# Patient Record
Sex: Female | Born: 1995 | Race: White | Hispanic: No | Marital: Married | Smoking: Current every day smoker
Health system: Southern US, Community
[De-identification: ages and names within clinical notes are randomized; demographics above are authoritative.]

## PROBLEM LIST (undated history)

## (undated) DIAGNOSIS — O24419 Gestational diabetes mellitus in pregnancy, unspecified control: Secondary | ICD-10-CM

## (undated) DIAGNOSIS — F32A Depression, unspecified: Secondary | ICD-10-CM

## (undated) DIAGNOSIS — G43909 Migraine, unspecified, not intractable, without status migrainosus: Secondary | ICD-10-CM

## (undated) DIAGNOSIS — O039 Complete or unspecified spontaneous abortion without complication: Secondary | ICD-10-CM

## (undated) DIAGNOSIS — F909 Attention-deficit hyperactivity disorder, unspecified type: Secondary | ICD-10-CM

## (undated) DIAGNOSIS — I10 Essential (primary) hypertension: Secondary | ICD-10-CM

## (undated) DIAGNOSIS — F419 Anxiety disorder, unspecified: Secondary | ICD-10-CM

## (undated) DIAGNOSIS — F3181 Bipolar II disorder: Secondary | ICD-10-CM

## (undated) DIAGNOSIS — F329 Major depressive disorder, single episode, unspecified: Secondary | ICD-10-CM

## (undated) HISTORY — DX: Complete or unspecified spontaneous abortion without complication: O03.9

## (undated) HISTORY — DX: Attention-deficit hyperactivity disorder, unspecified type: F90.9

## (undated) HISTORY — PX: TONSILLECTOMY AND ADENOIDECTOMY: SHX28

## (undated) HISTORY — DX: Essential (primary) hypertension: I10

## (undated) HISTORY — DX: Bipolar II disorder: F31.81

## (undated) HISTORY — DX: Depression, unspecified: F32.A

## (undated) HISTORY — DX: Gestational diabetes mellitus in pregnancy, unspecified control: O24.419

## (undated) HISTORY — DX: Anxiety disorder, unspecified: F41.9

---
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
## 2013-04-23 ENCOUNTER — Emergency Department: Payer: Self-pay | Admitting: Internal Medicine

## 2013-09-16 ENCOUNTER — Emergency Department: Payer: Self-pay | Admitting: Emergency Medicine

## 2013-09-16 LAB — CBC
HCT: 36.6 % (ref 35.0–47.0)
MCV: 84 fL (ref 80–100)
RDW: 13.5 % (ref 11.5–14.5)

## 2013-09-17 LAB — URINALYSIS, COMPLETE
Bacteria: NONE SEEN
Bilirubin,UR: NEGATIVE
Ketone: NEGATIVE
Leukocyte Esterase: NEGATIVE
Nitrite: NEGATIVE
Ph: 8 (ref 4.5–8.0)
Protein: NEGATIVE
Specific Gravity: 1.01 (ref 1.003–1.030)
Squamous Epithelial: <1
WBC UR: 1 /HPF (ref 0–5)

## 2013-09-17 LAB — BASIC METABOLIC PANEL
Anion Gap: 7 (ref 7–16)
Calcium, Total: 8.8 mg/dL — ABNORMAL LOW (ref 9.0–10.7)
Chloride: 109 mmol/L — ABNORMAL HIGH (ref 98–107)
Co2: 22 mmol/L (ref 21–32)
Creatinine: 0.66 mg/dL (ref 0.60–1.30)
EGFR (African American): >60
Osmolality: 277 (ref 275–301)
Potassium: 3.2 mmol/L — ABNORMAL LOW (ref 3.5–5.1)
Sodium: 138 mmol/L (ref 136–145)

## 2013-09-17 LAB — TROPONIN I: Troponin-I: <0.02 ng/mL

## 2013-09-20 ENCOUNTER — Ambulatory Visit: Payer: Self-pay | Admitting: Pediatrics

## 2015-05-21 ENCOUNTER — Encounter: Payer: Self-pay | Admitting: Emergency Medicine

## 2015-05-21 ENCOUNTER — Emergency Department
Admission: EM | Admit: 2015-05-21 | Discharge: 2015-05-21 | Disposition: A | Discharge status: Home / Self Care | Payer: 59 | Attending: Student | Admitting: Student

## 2015-05-21 DIAGNOSIS — G43901 Migraine, unspecified, not intractable, with status migrainosus: Secondary | ICD-10-CM | POA: Diagnosis not present

## 2015-05-21 DIAGNOSIS — G43001 Migraine without aura, not intractable, with status migrainosus: Secondary | ICD-10-CM

## 2015-05-21 DIAGNOSIS — Z87891 Personal history of nicotine dependence: Secondary | ICD-10-CM | POA: Insufficient documentation

## 2015-05-21 DIAGNOSIS — Z79899 Other long term (current) drug therapy: Secondary | ICD-10-CM | POA: Insufficient documentation

## 2015-05-21 DIAGNOSIS — G43909 Migraine, unspecified, not intractable, without status migrainosus: Secondary | ICD-10-CM | POA: Diagnosis present

## 2015-05-21 HISTORY — DX: Migraine, unspecified, not intractable, without status migrainosus: G43.909

## 2015-05-21 MED ORDER — PROMETHAZINE HCL 25 MG PO TABS
ORAL_TABLET | ORAL | Status: AC
  Administered 2015-05-21: 25 mg via ORAL
  Filled 2015-05-21: qty 1

## 2015-05-21 MED ORDER — BUTALBITAL-APAP-CAFFEINE 50-325-40 MG PO TABS: 1.0000 | ORAL_TABLET | Freq: Four times a day (QID) | ORAL | Status: AC | PRN

## 2015-05-21 MED ORDER — PROMETHAZINE HCL 25 MG PO TABS
25.0000 mg | ORAL_TABLET | Freq: Once | ORAL | Status: AC
  Administered 2015-05-21: 25 mg via ORAL

## 2015-05-21 MED ORDER — KETOROLAC TROMETHAMINE 60 MG/2ML IM SOLN
INTRAMUSCULAR | Status: AC
  Administered 2015-05-21: 60 mg via INTRAMUSCULAR
  Filled 2015-05-21: qty 2

## 2015-05-21 MED ORDER — KETOROLAC TROMETHAMINE 60 MG/2ML IM SOLN
60.0000 mg | Freq: Once | INTRAMUSCULAR | Status: AC
  Administered 2015-05-21: 60 mg via INTRAMUSCULAR

## 2015-05-21 NOTE — ED Provider Notes (Signed)
[facility] Emergency Department Provider Note  ____________________________________________  Time seen: Approximately 3:07 PM  I have reviewed the triage vital signs and the nursing notes.   HISTORY  Chief Complaint Migraine    HPI Frances Lamb is a 19 y.o. female presents to the emergency room with complaints of a migraine headache. Patient has a history of migraines in the past but has never seen a neurologist for the same. This is not the worst headache of her life. She needs a note for work per her request. Positive nausea, but negative vomiting. Positive photophobia and audiophobia.   Past Medical History  Diagnosis Date  . Migraine     There are no active problems to display for this patient.   No past surgical history on file.  Current Outpatient Rx  Name  Route  Sig  Dispense  Refill  . amphetamine-dextroamphetamine (ADDERALL) 10 MG tablet   Oral   Take 10 mg by mouth daily with breakfast.         . DULoxetine (CYMBALTA) 60 MG capsule   Oral   Take 60 mg by mouth daily.         . norethindrone-ethinyl estradiol 1/35 (ORTHO-NOVUM, NORTREL,CYCLAFEM) tablet   Oral   Take 1 tablet by mouth daily.         . QUEtiapine (SEROQUEL) 100 MG tablet   Oral   Take 100 mg by mouth at bedtime.         . butalbital-acetaminophen-caffeine (FIORICET) 50-325-40 MG per tablet   Oral   Take 1-2 tablets by mouth every 6 (six) hours as needed for headache.   20 tablet   0     Allergies Review of patient's allergies indicates no known allergies.  No family history on file.  Social History History  Substance Use Topics  . Smoking status: Former Games developer  . Smokeless tobacco: Current User  . Alcohol Use: Yes    Review of Systems Constitutional: No fever/chills Eyes: No visual changes. ENT: No sore throat. Positive headache Cardiovascular: Denies chest pain. Respiratory: Denies shortness of breath. Gastrointestinal: No abdominal  pain.  No nausea, no vomiting.  No diarrhea.  No constipation. Genitourinary: Negative for dysuria. Musculoskeletal: Negative for back pain. Skin: Negative for rash. Neurological: Positive for headaches, negative for focal weakness or numbness.  10-point ROS otherwise negative.  ____________________________________________   PHYSICAL EXAM:  VITAL SIGNS: ED Triage Vitals  Enc Vitals Group     BP 05/21/15 1251 133/82 mmHg     Pulse Rate 05/21/15 1251 96     Resp 05/21/15 1251 14     Temp 05/21/15 1250 98.6 F (37 C)     Temp Source 05/21/15 1250 Oral     SpO2 05/21/15 1251 99 %     Weight 05/21/15 1250 210 lb (95.255 kg)     Height 05/21/15 1250 5\' 2"  (1.575 m)     Head Cir --      Peak Flow --      Pain Score 05/21/15 1252 7     Pain Loc --      Pain Edu? --      Excl. in GC? --     Constitutional: Alert and oriented. Well appearing and in no acute distress. Eyes: Conjunctivae are normal. PERRL. EOMI. Head: Atraumatic. Nose: No congestion/rhinnorhea. Mouth/Throat: Mucous membranes are moist.  Oropharynx non-erythematous. Neck: No stridor.   Cardiovascular: Normal rate, regular rhythm. Grossly normal heart sounds.  Good peripheral circulation. Respiratory: Normal respiratory  effort.  No retractions. Lungs CTAB. Gastrointestinal: Soft and nontender. No distention. No abdominal bruits. No CVA tenderness. Musculoskeletal: No lower extremity tenderness nor edema.  No joint effusions. Neurologic:  Normal speech and language. No gross focal neurologic deficits are appreciated. Speech is normal. No gait instability. Skin:  Skin is warm, dry and intact. No rash noted. Psychiatric: Mood and affect are normal. Speech and behavior are normal.  ____________________________________________   LABS (all labs ordered are listed, but only abnormal results are displayed)  Labs Reviewed - No data to  display ____________________________________________  EKG  Deferred ____________________________________________  RADIOLOGY  Deferred ____________________________________________   PROCEDURES  Procedure(s) performed: None  Critical Care performed: No  ____________________________________________   INITIAL IMPRESSION / ASSESSMENT AND PLAN / ED COURSE  Pertinent labs & imaging results that were available during my care of the patient were reviewed by me and considered in my medical decision making (see chart for details).  Migraine headache. These migraines are chronic in nature and actually need to be followed up with neurology. Toradol 60 mg IM and Phenergan 25 mg by mouth given while in the ED. Patient states improvement after about 20 minutes.  Referral given to Dr. Katrinka Blazing, neurology. Patient to follow up with PCP as needed. Rx given for Fioricet as needed for headaches. Note given for work.  Patient denies any other emergency medical complaints at this time. Will return to the ER for worsening symptomology. ____________________________________________   FINAL CLINICAL IMPRESSION(S) / ED DIAGNOSES  Final diagnoses:  Migraine without aura and with status migrainosus, not intractable      Evangeline Dakin, PA-C 05/21/15 1634  Gayla Doss, MD 05/22/15 [id number]

## 2015-05-21 NOTE — Discharge Instructions (Signed)
Recurrent Migraine Headache A migraine headache is an intense, throbbing pain on one or both sides of your head. Recurrent migraines keep coming back. A migraine can last for 30 minutes to several hours. CAUSES  The exact cause of a migraine headache is not always known. However, a migraine may be caused when nerves in the brain become irritated and release chemicals that cause inflammation. This causes pain. Certain things may also trigger migraines, such as:   Alcohol.  Smoking.  Stress.  Menstruation.  Aged cheeses.  Foods or drinks that contain nitrates, glutamate, aspartame, or tyramine.  Lack of sleep.  Chocolate.  Caffeine.  Hunger.  Physical exertion.  Fatigue.  Medicines used to treat chest pain (nitroglycerine), birth control pills, estrogen, and some blood pressure medicines. SYMPTOMS   Pain on one or both sides of your head.  Pulsating or throbbing pain.  Severe pain that prevents daily activities.  Pain that is aggravated by any physical activity.  Nausea, vomiting, or both.  Dizziness.  Pain with exposure to bright lights, loud noises, or activity.  General sensitivity to bright lights, loud noises, or smells. Before you get a migraine, you may get warning signs that a migraine is coming (aura). An aura may include:  Seeing flashing lights.  Seeing bright spots, halos, or zigzag lines.  Having tunnel vision or blurred vision.  Having feelings of numbness or tingling.  Having trouble talking.  Having muscle weakness. DIAGNOSIS  A recurrent migraine headache is often diagnosed based on:  Symptoms.  Physical examination.  A CT scan or MRI of your head. These imaging tests cannot diagnose migraines but can help rule out other causes of headaches.  TREATMENT  Medicines may be given for pain and nausea. Medicines can also be given to help prevent recurrent migraines. HOME CARE INSTRUCTIONS  Only take over-the-counter or prescription  medicines for pain or discomfort as directed by your health care provider. The use of long-term narcotics is not recommended.  Lie down in a dark, quiet room when you have a migraine.  Keep a journal to find out what may trigger your migraine headaches. For example, write down:  What you eat and drink.  How much sleep you get.  Any change to your diet or medicines.  Limit alcohol consumption.  Quit smoking if you smoke.  Get 7-9 hours of sleep, or as recommended by your health care provider.  Limit stress.  Keep lights dim if bright lights bother you and make your migraines worse. SEEK MEDICAL CARE IF:   You do not get relief from the medicines given to you.  You have a recurrence of pain.  You have a fever. SEEK IMMEDIATE MEDICAL CARE IF:  Your migraine becomes severe.  You have a stiff neck.  You have loss of vision.  You have muscular weakness or loss of muscle control.  You start losing your balance or have trouble walking.  You feel faint or pass out.  You have severe symptoms that are different from your first symptoms. MAKE SURE YOU:   Understand these instructions.  Will watch your condition.  Will get help right away if you are not doing well or get worse. Document Released: 08/10/2001 Document Revised: 04/01/2014 Document Reviewed: 07/23/2013 Exit[employer] Patient Information 2015 [employer]. This information is not intended to replace advice given to you by your health care provider. Make sure you discuss any questions you have with your health care provider.  

## 2015-05-21 NOTE — ED Notes (Signed)
Migraine.  History of same, but now they are almost daily.

## 2016-12-23 ENCOUNTER — Encounter: Payer: Self-pay | Admitting: Emergency Medicine

## 2016-12-23 ENCOUNTER — Emergency Department
Admission: EM | Admit: 2016-12-23 | Discharge: 2016-12-23 | Disposition: A | Discharge status: Home / Self Care | Payer: BC Managed Care – PPO | Attending: Emergency Medicine | Admitting: Emergency Medicine

## 2016-12-23 DIAGNOSIS — K602 Anal fissure, unspecified: Secondary | ICD-10-CM | POA: Diagnosis not present

## 2016-12-23 DIAGNOSIS — F17200 Nicotine dependence, unspecified, uncomplicated: Secondary | ICD-10-CM | POA: Diagnosis not present

## 2016-12-23 DIAGNOSIS — K625 Hemorrhage of anus and rectum: Secondary | ICD-10-CM

## 2016-12-23 LAB — BASIC METABOLIC PANEL
Anion gap: 6 (ref 5–15)
BUN: 10 mg/dL (ref 6–20)
CHLORIDE: 105 mmol/L (ref 101–111)
CO2: 27 mmol/L (ref 22–32)
Calcium: 8.8 mg/dL — ABNORMAL LOW (ref 8.9–10.3)
Creatinine, Ser: 0.82 mg/dL (ref 0.44–1.00)
GFR calc non Af Amer: >60 mL/min (ref >60)
Glucose, Bld: 94 mg/dL (ref 65–99)
POTASSIUM: 4.3 mmol/L (ref 3.5–5.1)
Sodium: 138 mmol/L (ref 135–145)

## 2016-12-23 LAB — CBC
HCT: 38.8 % (ref 35.0–47.0)
HEMOGLOBIN: 13.6 g/dL (ref 12.0–16.0)
MCH: 30.5 pg (ref 26.0–34.0)
MCHC: 35.1 g/dL (ref 32.0–36.0)
MCV: 86.9 fL (ref 80.0–100.0)
Platelets: 286 10*3/uL (ref 150–440)
RBC: 4.46 MIL/uL (ref 3.80–5.20)
RDW: 13.4 % (ref 11.5–14.5)
WBC: 9.2 10*3/uL (ref 3.6–11.0)

## 2016-12-23 LAB — POCT PREGNANCY, URINE: PREG TEST UR: NEGATIVE

## 2016-12-23 MED ORDER — DOCUSATE SODIUM 100 MG PO CAPS: 100.0000 mg | ORAL_CAPSULE | Freq: Two times a day (BID) | ORAL | 0 refills | Status: AC

## 2016-12-23 MED ORDER — KETOROLAC TROMETHAMINE 30 MG/ML IJ SOLN
30.0000 mg | Freq: Once | INTRAMUSCULAR | Status: AC
Start: 2016-12-23 — End: 2016-12-23
  Administered 2016-12-23: 30 mg via INTRAVENOUS
  Filled 2016-12-23: qty 1

## 2016-12-23 NOTE — ED Triage Notes (Signed)
Patient ambulatory to triage with steady gait, without difficulty or distress noted; pt reports since last night having rectal pain with bleeding; st hx of "stomach problems with frequent constipation and diarrhea"; also reports left lower and mid lower abd pain with nausea

## 2016-12-23 NOTE — ED Provider Notes (Signed)
[facility] Emergency Department Provider Note  ____________________________________________  Time seen: Approximately 8:25 AM  I have reviewed the triage vital signs and the nursing notes.   HISTORY  Chief Complaint No chief complaint on file.   HPI Frances Lamb is a 21 y.o. female no significant past medical history who presents for evaluation of rectal bleeding. Patient reports since 6 PM yesterday she has had 6 bowel movements with bright red blood in the toilet. She is unable to quantify but tells me was a large amount. She also has had rectal pain. The first episode of bleeding happen while she was having a bowel movement. Patient endorses for the last many years she has alternating constipation and diarrhea. She is adopted and does not know her family history. She has never seen a GI specialist. She has had prior episodes of small amount of rectal bleeding but nothing like this one. She also endorses diffuse cramping abdominal pain and has been passing mucus from her rectum during her last few bowel movements. No vomiting, no NSAID use, she drinks alcohol occasionally, no peptic ulcer disease history. She denies fever or chills, she sure the bleeding is not coming from her vagina. She is on OCPs and her period is not due for another 2 weeks.  Past Medical History:  Diagnosis Date  . Migraine     There are no active problems to display for this patient.   History reviewed. No pertinent surgical history.  Prior to Admission medications   Medication Sig Start Date End Date Taking? Authorizing Provider  amphetamine-dextroamphetamine (ADDERALL) 30 MG tablet Take 30 mg by mouth daily with breakfast.    Yes Historical Provider, MD  DULoxetine (CYMBALTA) 60 MG capsule Take 60 mg by mouth daily.   Yes Historical Provider, MD  lamoTRIgine (LAMICTAL) 200 MG tablet Take 200 mg by mouth daily. 08/07/15  Yes Historical Provider, MD  norethindrone-ethinyl estradiol  1/35 (ORTHO-NOVUM, NORTREL,CYCLAFEM) tablet Take 1 tablet by mouth daily.   Yes Historical Provider, MD  Prenatal Vit-Fe Fumarate-FA (PRENATAL COMPLETE PO) Take 1 tablet by mouth daily.   Yes Historical Provider, MD  traZODone (DESYREL) 100 MG tablet Take 200 mg by mouth daily.   Yes Historical Provider, MD  vitamin B-12 (CYANOCOBALAMIN) 1000 MCG tablet Take 1,000 mcg by mouth daily.   Yes Historical Provider, MD  docusate sodium (COLACE) 100 MG capsule Take 1 capsule (100 mg total) by mouth 2 (two) times daily. 12/23/16 12/23/17  Nita Sickle, MD  QUEtiapine (SEROQUEL) 100 MG tablet Take 100 mg by mouth at bedtime.    Historical Provider, MD    Allergies Patient has no known allergies.  No family history on file.  Social History Social History  Substance Use Topics  . Smoking status: Former Games developer  . Smokeless tobacco: Current User  . Alcohol use Yes    Review of Systems  Constitutional: Negative for fever. Eyes: Negative for visual changes. ENT: Negative for sore throat. Neck: No neck pain  Cardiovascular: Negative for chest pain. Respiratory: Negative for shortness of breath. Gastrointestinal: Negative for abdominal pain, vomiting or diarrhea. Genitourinary: Negative for dysuria. + rectal bleeding Musculoskeletal: Negative for back pain. Skin: Negative for rash. Neurological: Negative for headaches, weakness or numbness. Psych: No SI or HI  ____________________________________________   PHYSICAL EXAM:  VITAL SIGNS: ED Triage Vitals [12/23/16 0646]  Enc Vitals Group     BP 128/62     Pulse Rate 91     Resp 18  Temp 98.1 F (36.7 C)     Temp Source Oral     SpO2 97 %     Weight 230 lb (104.3 kg)     Height 5\' 2"  (1.575 m)     Head Circumference      Peak Flow      Pain Score 7     Pain Loc      Pain Edu?      Excl. in GC?     Constitutional: Alert and oriented. Well appearing and in no apparent distress. HEENT:      Head: Normocephalic and  atraumatic.         Eyes: Conjunctivae are normal. Sclera is non-icteric. EOMI. PERRL      Mouth/Throat: Mucous membranes are moist.       Neck: Supple with no signs of meningismus. Cardiovascular: Regular rate and rhythm. No murmurs, gallops, or rubs. 2+ symmetrical distal pulses are present in all extremities. No JVD. Respiratory: Normal respiratory effort. Lungs are clear to auscultation bilaterally. No wheezes, crackles, or rhonchi.  Gastrointestinal: Soft, mildly tender to palpation diffusely, and non distended with positive bowel sounds. No rebound or guarding. Genitourinary: No CVA tenderness. Rectal exam showing a posterior anal fissure with no active bleeding, rectal exam with no evidence of internal hemorrhoids, no stool in the rectal vault, there is old blood perianally.  Musculoskeletal: Nontender with normal range of motion in all extremities. No edema, cyanosis, or erythema of extremities. Neurologic: Normal speech and language. Face is symmetric. Moving all extremities. No gross focal neurologic deficits are appreciated. Skin: Skin is warm, dry and intact. No rash noted. Psychiatric: Mood and affect are normal. Speech and behavior are normal.  ____________________________________________   LABS (all labs ordered are listed, but only abnormal results are displayed)  Labs Reviewed  BASIC METABOLIC PANEL - Abnormal; Notable for the following:       Result Value   Calcium 8.8 (*)    All other components within normal limits  CBC  POCT PREGNANCY, URINE   ____________________________________________  EKG  none ____________________________________________  RADIOLOGY  none  ____________________________________________   PROCEDURES  Procedure(s) performed: None Procedures Critical Care performed:  None ____________________________________________   INITIAL IMPRESSION / ASSESSMENT AND PLAN / ED COURSE  21 y.o. female no significant past medical history who  presents for evaluation of rectal bleeding x 6 since last night. Patient found to have an anal fissure which fits her presentation of bleeding and rectal pain. We'll check CBC since patient has had 6 episodes although she is hemodynamically stable and I have low suspicion she'll have an acute blood loss anemia. Patient has had many years of alternating constipation and diarrhea concerning for IBS or IBD. She has unknown family history. We'll refer her to GI for further evaluation. At this time I have low suspicion for other pathologies such as upper GI bleed from peptic ulcer disease or gastritis with no coffee-ground emesis, no NSAID use. Also low suspicion for diverticular bleed since patient is very young.   Clinical Course as of Dec 23 920  Thu Dec 23, 2016  0920 Hemoglobin is normal at 13.6. Patient will be discharged home with referral to see GI and close follow-up with Dr. Thedore MinsSingh. who is her primary care doctor.  [CV]    Clinical Course User Index [CV] Nita Sicklearolina Cailen Mihalik, MD    Pertinent labs & imaging results that were available during my care of the patient were reviewed by me and considered in my medical  decision making (see chart for details).    ____________________________________________   FINAL CLINICAL IMPRESSION(S) / ED DIAGNOSES  Final diagnoses:  Anal fissure  Rectal bleeding      NEW MEDICATIONS STARTED DURING THIS VISIT:  New Prescriptions   DOCUSATE SODIUM (COLACE) 100 MG CAPSULE    Take 1 capsule (100 mg total) by mouth 2 (two) times daily.     Note:  This document was prepared using Dragon voice recognition software and may include unintentional dictation errors.    Nita Sickle, MD 12/23/16 [phone]

## 2016-12-23 NOTE — Discharge Instructions (Signed)
Follow-up with her doctor in 1-2 days to recheck her blood counts. Return to the emergency room if you continue to have bleeding, if you feel dizzy, if you have chest pain, fever, abdominal pain, or any new symptoms concerning to you.

## 2018-04-18 DIAGNOSIS — F411 Generalized anxiety disorder: Secondary | ICD-10-CM | POA: Insufficient documentation

## 2018-06-05 ENCOUNTER — Emergency Department: Payer: BC Managed Care – PPO

## 2018-06-05 ENCOUNTER — Emergency Department
Admission: EM | Admit: 2018-06-05 | Discharge: 2018-06-05 | Disposition: A | Discharge status: Home / Self Care | Payer: BC Managed Care – PPO | Attending: Student in an Organized Health Care Education/Training Program | Admitting: Student in an Organized Health Care Education/Training Program

## 2018-06-05 ENCOUNTER — Encounter: Payer: Self-pay | Admitting: Emergency Medicine

## 2018-06-05 DIAGNOSIS — S93492A Sprain of other ligament of left ankle, initial encounter: Secondary | ICD-10-CM | POA: Diagnosis not present

## 2018-06-05 DIAGNOSIS — F17290 Nicotine dependence, other tobacco product, uncomplicated: Secondary | ICD-10-CM | POA: Insufficient documentation

## 2018-06-05 DIAGNOSIS — Y999 Unspecified external cause status: Secondary | ICD-10-CM | POA: Insufficient documentation

## 2018-06-05 DIAGNOSIS — S99912A Unspecified injury of left ankle, initial encounter: Secondary | ICD-10-CM | POA: Diagnosis present

## 2018-06-05 DIAGNOSIS — W010XXA Fall on same level from slipping, tripping and stumbling without subsequent striking against object, initial encounter: Secondary | ICD-10-CM | POA: Insufficient documentation

## 2018-06-05 DIAGNOSIS — Y929 Unspecified place or not applicable: Secondary | ICD-10-CM | POA: Diagnosis not present

## 2018-06-05 DIAGNOSIS — Y93K1 Activity, walking an animal: Secondary | ICD-10-CM | POA: Diagnosis not present

## 2018-06-05 DIAGNOSIS — Z79899 Other long term (current) drug therapy: Secondary | ICD-10-CM | POA: Insufficient documentation

## 2018-06-05 DIAGNOSIS — W19XXXA Unspecified fall, initial encounter: Secondary | ICD-10-CM

## 2018-06-05 MED ORDER — NAPROXEN 500 MG PO TABS: 500.0000 mg | ORAL_TABLET | Freq: Two times a day (BID) | ORAL | 0 refills | Status: DC

## 2018-06-05 MED ORDER — HYDROCODONE-ACETAMINOPHEN 5-325 MG PO TABS
1.0000 | ORAL_TABLET | Freq: Once | ORAL | Status: AC
  Administered 2018-06-05: 1 via ORAL
  Filled 2018-06-05: qty 1

## 2018-06-05 NOTE — ED Provider Notes (Signed)
[facility] Emergency Department Provider Note    First MD Initiated Contact with Patient 06/05/18 1931     (approximate)  I have reviewed the triage vital signs and the nursing notes.   HISTORY  Chief Complaint Ankle Pain    HPI Frances Lamb is a 22 y.o. female presents the ER with chief complaint of left ankle pain after she was walking her dog and tripped rolling her left ankle.  Denied any knee pain but did feel an initial pop and sudden onset of moderate to severe left ankle pain or swelling.  Denies any numbness or tingling.  Did not hit her head.  No abdominal pain or chest pain.  Past Medical History:  Diagnosis Date  . Migraine    No family history on file. History reviewed. No pertinent surgical history. There are no active problems to display for this patient.     Prior to Admission medications   Medication Sig Start Date End Date Taking? Authorizing Provider  amphetamine-dextroamphetamine (ADDERALL) 30 MG tablet Take 30 mg by mouth daily with breakfast.     [provider]  DULoxetine (CYMBALTA) 60 MG capsule Take 60 mg by mouth daily.    [provider]  lamoTRIgine (LAMICTAL) 200 MG tablet Take 200 mg by mouth daily. 08/07/15   [provider]  norethindrone-ethinyl estradiol 1/35 (ORTHO-NOVUM, NORTREL,CYCLAFEM) tablet Take 1 tablet by mouth daily.    [provider]  Prenatal Vit-Fe Fumarate-FA (PRENATAL COMPLETE PO) Take 1 tablet by mouth daily.    [provider]  QUEtiapine (SEROQUEL) 100 MG tablet Take 100 mg by mouth at bedtime.    [provider]  traZODone (DESYREL) 100 MG tablet Take 200 mg by mouth daily.    [provider]  vitamin B-12 (CYANOCOBALAMIN) 1000 MCG tablet Take 1,000 mcg by mouth daily.    [provider]    Allergies Patient has no known allergies.    Social History Social History   Tobacco Use  . Smoking status: Former Games developermoker  .  Smokeless tobacco: Current User  Substance Use Topics  . Alcohol use: Yes  . Drug use: Not on file    Review of Systems Patient denies headaches, rhinorrhea, blurry vision, numbness, shortness of breath, chest pain, edema, cough, abdominal pain, nausea, vomiting, diarrhea, dysuria, fevers, rashes or hallucinations unless otherwise stated above in HPI. ____________________________________________   PHYSICAL EXAM:  VITAL SIGNS: Vitals:   06/05/18 1755  BP: (!) 150/97  Pulse: 90  Resp: 20  Temp: 99.2 F (37.3 C)  SpO2: 98%    Constitutional: Alert and oriented. Well appearing and in no acute distress. Eyes: Conjunctivae are normal.  Head: Atraumatic. Nose: No congestion/rhinnorhea. Mouth/Throat: Mucous membranes are moist.   Neck: Painless ROM.  Cardiovascular:   Good peripheral circulation. Respiratory: Normal respiratory effort.  No retractions.  Gastrointestinal: Soft and nontender.  Musculoskeletal: There is swelling to the left distal ankle with pain over the lateral malleolus as well as base of the fifth metatarsal.  No pain to palpation or swelling of the proximal knee.   Neurologic:  Normal speech and language. No gross focal neurologic deficits are appreciated.  Skin:  Skin is warm, dry and intact. No rash noted. Psychiatric: Mood and affect are normal. Speech and behavior are normal.  ____________________________________________   LABS (all labs ordered are listed, but only abnormal results are displayed)  No results found for this or any previous visit (from the past 24 hour(s)). ____________________________________________  ____________________________________________  RADIOLOGY  I personally reviewed all radiographic images ordered to evaluate for the above acute complaints and reviewed radiology reports and findings.  These findings were personally discussed with the patient.  Please see medical record for radiology  report.  ____________________________________________   PROCEDURES  Procedure(s) performed:  Procedures    Critical Care performed: no ____________________________________________   INITIAL IMPRESSION / ASSESSMENT AND PLAN / ED COURSE  Pertinent labs & imaging results that were available during my care of the patient were reviewed by me and considered in my medical decision making (see chart for details).  DDX: fracture, sprain, contusion  Frances Lamb is a 22 y.o. who presents to the ED with acute left ankle injury. Patient is AFVSS in ED. Exam as above. Given current presentation have considered the above differential. Denies any other injuries. Denies motor or sensory loss. Able to bear weight. Afebrile and VSS in Ed. Exam as above. NV intact throughout and distal to injury. Pt able to range joint. No clinical suspicion for infectious process or septic joint. Treatments will include observation, X-rays.  X-rays w/o fracture. No other injuries reported or noted on exam. Presentation c/w sprain. Discussed supportive care and follow up with pt.       ____________________________________________   FINAL CLINICAL IMPRESSION(S) / ED DIAGNOSES  Final diagnoses:  Sprain of anterior talofibular ligament of left ankle, initial encounter      NEW MEDICATIONS STARTED DURING THIS VISIT:  New Prescriptions   No medications on file     Note:  This document was prepared using Dragon voice recognition software and may include unintentional dictation errors.     Willy Eddy, MD 06/05/18 2019

## 2018-06-05 NOTE — ED Triage Notes (Addendum)
Patient presents to the ED with left ankle pain and obvious deformity post fall.  Patient states she was tripped by her dog.  Patient is tearful and uncomfortable during triage.  Foot is warm and normal color.  Pedal pulse felt.

## 2018-06-19 ENCOUNTER — Encounter: Payer: Self-pay | Admitting: Obstetrics and Gynecology

## 2018-06-19 ENCOUNTER — Ambulatory Visit (INDEPENDENT_AMBULATORY_CARE_PROVIDER_SITE_OTHER): Payer: BC Managed Care – PPO | Admitting: Obstetrics and Gynecology

## 2018-06-19 VITALS — BP 122/74 | HR 90 | Ht 62.0 in | Wt 219.0 lb

## 2018-06-19 DIAGNOSIS — Z3202 Encounter for pregnancy test, result negative: Secondary | ICD-10-CM

## 2018-06-19 DIAGNOSIS — N946 Dysmenorrhea, unspecified: Secondary | ICD-10-CM

## 2018-06-19 DIAGNOSIS — N921 Excessive and frequent menstruation with irregular cycle: Secondary | ICD-10-CM | POA: Diagnosis not present

## 2018-06-19 LAB — POCT URINE PREGNANCY: Preg Test, Ur: NEGATIVE

## 2018-06-19 NOTE — Progress Notes (Signed)
Leotis Shames, MD   Chief Complaint  Patient presents with  . Dysmenorrhea    Clots size of [place] dollar coin, others around the size of a quarter, severe cramping x7 days     HPI:      [name] is a 22 y.o. No obstetric history on file. who LMP was Patient's last menstrual period was 06/12/2018 (approximate)., presents today for NP (>3 yrs) eval of irregular bleeding this menses on OCPs. Menses usually monthly, last 4-5 days, 1-2 heavy days, changing pads BID to TID, with small clots, no BTB, mild dysmen. Pt missed a few pills this cycle and bleeding started 3rd wk of pills. Has been bleeding for 5-6 days, changing pads BID to TID. She noted a 1/2 dollar sized clot today with white tissue in it. Having worse dysmen, improved somewhat with ibup. Pt starts placebo pills in a couple days.  Pt is sex active, no new partners.  Has paps/annuals with PCP, last one last yr with HPV, repeat due this yr.  Hx of miscarriage in past.    Past Medical History:  Diagnosis Date  . Migraine   . Miscarriage     History reviewed. No pertinent surgical history.  History reviewed. No pertinent family history.  Social History   Socioeconomic History  . Marital status: Single    Spouse name: Not on file  . Number of children: Not on file  . Years of education: Not on file  . Highest education level: Not on file  Occupational History  . Not on file  Social Needs  . Financial resource strain: Not on file  . Food insecurity:    Worry: Not on file    Inability: Not on file  . Transportation needs:    Medical: Not on file    Non-medical: Not on file  Tobacco Use  . Smoking status: Former Games developer  . Smokeless tobacco: Current User  Substance and Sexual Activity  . Alcohol use: Yes  . Drug use: Never  . Sexual activity: Yes    Birth control/protection: Pill  Lifestyle  . Physical activity:    Days per week: Not on file    Minutes per session: Not on file  . Stress: Not on file    Relationships  . Social connections:    Talks on phone: Not on file    Gets together: Not on file    Attends religious service: Not on file    Active member of club or organization: Not on file    Attends meetings of clubs or organizations: Not on file    Relationship status: Not on file  . Intimate partner violence:    Fear of current or ex partner: Not on file    Emotionally abused: Not on file    Physically abused: Not on file    Forced sexual activity: Not on file  Other Topics Concern  . Not on file  Social History Narrative  . Not on file    Outpatient Medications Prior to Visit  Medication Sig Dispense Refill  . DULoxetine (CYMBALTA) 60 MG capsule Take 60 mg by mouth daily.    . LamoTRIgine 250 MG TB24 24 hour tablet TK 1 T PO QD  2  . metoprolol succinate (TOPROL-XL) 25 MG 24 hr tablet Take by mouth.    . norethindrone-ethinyl estradiol 1/35 (ORTHO-NOVUM, NORTREL,CYCLAFEM) tablet Take 1 tablet by mouth daily.    . QUEtiapine Fumarate (SEROQUEL XR) 150 MG 24 hr  tablet TK 1 T PO HS  3  . amphetamine-dextroamphetamine (ADDERALL) 30 MG tablet Take 30 mg by mouth daily with breakfast.     . lamoTRIgine (LAMICTAL) 200 MG tablet Take 200 mg by mouth daily.    . naproxen (NAPROSYN) 500 MG tablet Take 1 tablet (500 mg total) by mouth 2 (two) times daily with a meal. (Patient not taking: Reported on 06/19/2018) 20 tablet 0  . Prenatal Vit-Fe Fumarate-FA (PRENATAL COMPLETE PO) Take 1 tablet by mouth daily.    . QUEtiapine (SEROQUEL) 100 MG tablet Take 100 mg by mouth at bedtime.    . traZODone (DESYREL) 100 MG tablet Take 200 mg by mouth daily.    . vitamin B-12 (CYANOCOBALAMIN) 1000 MCG tablet Take 1,000 mcg by mouth daily.     No facility-administered medications prior to visit.     ROS:  Review of Systems  Constitutional: Negative for fatigue, fever and unexpected weight change.  Respiratory: Negative for cough, shortness of breath and wheezing.   Cardiovascular: Negative  for chest pain, palpitations and leg swelling.  Gastrointestinal: Negative for blood in stool, constipation, diarrhea, nausea and vomiting.  Endocrine: Negative for cold intolerance, heat intolerance and polyuria.  Genitourinary: Positive for frequency and menstrual problem. Negative for dyspareunia, dysuria, flank pain, genital sores, hematuria, pelvic pain, urgency, vaginal bleeding, vaginal discharge and vaginal pain.  Musculoskeletal: Negative for back pain, joint swelling and myalgias.  Skin: Negative for rash.  Neurological: Negative for dizziness, syncope, light-headedness, numbness and headaches.  Hematological: Negative for adenopathy.  Psychiatric/Behavioral: Negative for agitation, confusion, sleep disturbance and suicidal ideas. The patient is not nervous/anxious.     OBJECTIVE:   Vitals:  BP 122/74   Pulse 90   Ht 5\' 2"  (1.575 m)   Wt 219 lb (99.3 kg)   LMP 06/12/2018 (Approximate)   BMI 40.06 kg/m   Physical Exam  Constitutional: She is oriented to person, place, and time. Vital signs are normal. She appears well-developed.  Pulmonary/Chest: Effort normal.  Genitourinary: Uterus normal. There is no rash, tenderness or lesion on the right labia. There is no rash, tenderness or lesion on the left labia. Uterus is not enlarged and not tender. Cervix exhibits no motion tenderness. Right adnexum displays no mass and no tenderness. Left adnexum displays no mass and no tenderness. There is bleeding in the vagina. No erythema or tenderness in the vagina. No vaginal discharge found.  Musculoskeletal: Normal range of motion.  Neurological: She is alert and oriented to person, place, and time.  Psychiatric: She has a normal mood and affect. Her behavior is normal. Thought content normal.  Vitals reviewed.   Results: Results for orders placed or performed in visit on 06/19/18 (from the past 24 hour(s))  POCT urine pregnancy     Status: Normal   Collection Time: 06/19/18  3:25 PM    Result Value Ref Range   Preg Test, Ur Negative Negative     Assessment/Plan: Breakthrough bleeding on OCPs - After missed pills. Neg UPT. Reassurance. F/u if sx persist for further eval with labs/u/s.  Dysmenorrhea - Plan: POCT urine pregnancy    Return if symptoms worsen or fail to improve.  Alethea Terhaar B. Alayna Mabe, PA-C 06/19/2018 3:41 PM

## 2018-06-19 NOTE — Patient Instructions (Signed)
I value your feedback and entrusting us with your care. If you get a [facility] patient survey, I would appreciate you taking the time to let us know about your experience today. Thank you! 

## 2019-03-13 ENCOUNTER — Emergency Department: Payer: Managed Care, Other (non HMO)

## 2019-03-13 ENCOUNTER — Emergency Department
Admission: EM | Admit: 2019-03-13 | Discharge: 2019-03-13 | Disposition: A | Discharge status: Home / Self Care | Payer: Managed Care, Other (non HMO) | Attending: Emergency Medicine | Admitting: Emergency Medicine

## 2019-03-13 ENCOUNTER — Other Ambulatory Visit: Payer: Self-pay

## 2019-03-13 ENCOUNTER — Encounter: Payer: Self-pay | Admitting: Emergency Medicine

## 2019-03-13 DIAGNOSIS — Z3A01 Less than 8 weeks gestation of pregnancy: Secondary | ICD-10-CM | POA: Diagnosis not present

## 2019-03-13 DIAGNOSIS — Z79899 Other long term (current) drug therapy: Secondary | ICD-10-CM | POA: Insufficient documentation

## 2019-03-13 DIAGNOSIS — O209 Hemorrhage in early pregnancy, unspecified: Secondary | ICD-10-CM | POA: Diagnosis present

## 2019-03-13 DIAGNOSIS — Z87891 Personal history of nicotine dependence: Secondary | ICD-10-CM | POA: Insufficient documentation

## 2019-03-13 DIAGNOSIS — O4690 Antepartum hemorrhage, unspecified, unspecified trimester: Secondary | ICD-10-CM

## 2019-03-13 LAB — URINALYSIS, COMPLETE (UACMP) WITH MICROSCOPIC
Bacteria, UA: NONE SEEN
Bilirubin Urine: NEGATIVE
Glucose, UA: NEGATIVE mg/dL
Ketones, ur: NEGATIVE mg/dL
Leukocytes,Ua: NEGATIVE
Nitrite: NEGATIVE
Protein, ur: NEGATIVE mg/dL
Specific Gravity, Urine: 1.003 — ABNORMAL LOW (ref 1.005–1.030)
pH: 6 (ref 5.0–8.0)

## 2019-03-13 LAB — CBC WITH DIFFERENTIAL/PLATELET
Abs Immature Granulocytes: 0.06 10*3/uL (ref 0.00–0.07)
Basophils Absolute: 0.1 10*3/uL (ref 0.0–0.1)
Basophils Relative: 1 %
Eosinophils Absolute: 0.1 10*3/uL (ref 0.0–0.5)
Eosinophils Relative: 1 %
HCT: 41.5 % (ref 36.0–46.0)
Hemoglobin: 13.8 g/dL (ref 12.0–15.0)
Immature Granulocytes: 0 %
Lymphocytes Relative: 28 %
Lymphs Abs: 3.9 10*3/uL (ref 0.7–4.0)
MCH: 28.8 pg (ref 26.0–34.0)
MCHC: 33.3 g/dL (ref 30.0–36.0)
MCV: 86.5 fL (ref 80.0–100.0)
Monocytes Absolute: 0.8 10*3/uL (ref 0.1–1.0)
Monocytes Relative: 6 %
Neutro Abs: 8.9 10*3/uL — ABNORMAL HIGH (ref 1.7–7.7)
Neutrophils Relative %: 64 %
Platelets: 373 10*3/uL (ref 150–400)
RBC: 4.8 MIL/uL (ref 3.87–5.11)
RDW: 13.3 % (ref 11.5–15.5)
WBC: 13.8 10*3/uL — ABNORMAL HIGH (ref 4.0–10.5)
nRBC: 0 % (ref 0.0–0.2)

## 2019-03-13 LAB — HCG, QUANTITATIVE, PREGNANCY: hCG, Beta Chain, Quant, S: 581 m[IU]/mL — ABNORMAL HIGH (ref <5)

## 2019-03-13 LAB — ABO/RH: ABO/RH(D): O POS

## 2019-03-13 NOTE — Discharge Instructions (Addendum)
Do not use tampons, douche or have sexual intercourse until seen by your doctor.  Return to the ER for worsening symptoms, soaking more than 1 pad per hour, fainting or other concerns.

## 2019-03-13 NOTE — ED Triage Notes (Signed)
Patient ambulatory to triage with steady gait, without difficulty or distress noted; pt reports approx 5wks (confirmed by [facility]) pregnant with abd cramping and bleeding

## 2019-03-13 NOTE — ED Provider Notes (Signed)
[facility] Emergency Department Provider Note   ____________________________________________   First MD Initiated Contact with Patient 03/13/19 [phone]     (approximate)  I have reviewed the triage vital signs and the nursing notes.   HISTORY  Chief Complaint Vaginal Bleeding    HPI ALARIA OCONNOR is a 23 y.o. female G2 P0 AB 1 approximately [redacted] weeks pregnant by dates who presents with vaginal spotting.  Patient reports vaginal spotting approximately 6 PM.  Now with pelvic cramping.  Last sexual intercourse over 2 weeks ago.  Pregnancy confirmed at health department; no ultrasound yet.  Denies recent fever, cough, chest pain, shortness of breath, nausea, vomiting.  Denies recent travel, trauma or exposure to persons diagnosed with coronavirus.       Past Medical History:  Diagnosis Date  . Migraine   . Miscarriage     There are no active problems to display for this patient.   History reviewed. No pertinent surgical history.  Prior to Admission medications   Medication Sig Start Date End Date Taking? Authorizing Provider  amphetamine-dextroamphetamine (ADDERALL) 30 MG tablet Take 30 mg by mouth daily with breakfast.     [provider]  DULoxetine (CYMBALTA) 60 MG capsule Take 60 mg by mouth daily.    [provider]  lamoTRIgine (LAMICTAL) 200 MG tablet Take 200 mg by mouth daily. 08/07/15   [provider]  LamoTRIgine 250 MG TB24 24 hour tablet TK 1 T PO QD 05/16/18   [provider]  metoprolol succinate (TOPROL-XL) 25 MG 24 hr tablet Take by mouth. 01/17/18 01/17/19  [provider]  naproxen (NAPROSYN) 500 MG tablet Take 1 tablet (500 mg total) by mouth 2 (two) times daily with a meal. Patient not taking: Reported on 06/19/2018 06/05/18 06/05/19  Willy Eddy, MD  norethindrone-ethinyl estradiol 1/35 (ORTHO-NOVUM, NORTREL,CYCLAFEM) tablet Take 1 tablet by mouth daily.    [provider]  Prenatal  Vit-Fe Fumarate-FA (PRENATAL COMPLETE PO) Take 1 tablet by mouth daily.    [provider]  QUEtiapine (SEROQUEL) 100 MG tablet Take 100 mg by mouth at bedtime.    [provider]  QUEtiapine Fumarate (SEROQUEL XR) 150 MG 24 hr tablet TK 1 T PO HS 05/15/18   [provider]  traZODone (DESYREL) 100 MG tablet Take 200 mg by mouth daily.    [provider]  vitamin B-12 (CYANOCOBALAMIN) 1000 MCG tablet Take 1,000 mcg by mouth daily.    [provider]    Allergies Patient has no known allergies.  No family history on file.  Social History Social History   Tobacco Use  . Smoking status: Former Games developer  . Smokeless tobacco: Current User  Substance Use Topics  . Alcohol use: Yes  . Drug use: Never    Review of Systems  Constitutional: No fever/chills Eyes: No visual changes. ENT: No sore throat. Cardiovascular: Denies chest pain. Respiratory: Denies shortness of breath. Gastrointestinal: No abdominal pain.  No nausea, no vomiting.  No diarrhea.  No constipation. Genitourinary: Positive for vaginal spotting.  Negative for dysuria. Musculoskeletal: Negative for back pain. Skin: Negative for rash. Neurological: Negative for headaches, focal weakness or numbness.   ____________________________________________   PHYSICAL EXAM:  VITAL SIGNS: ED Triage Vitals  Enc Vitals Group     BP 03/13/19 0316 (!) 149/93     Pulse Rate 03/13/19 0316 (!) 107     Resp 03/13/19 0316 18     Temp 03/13/19 0316 98.2 F (36.8  C)     Temp Source 03/13/19 0316 Oral     SpO2 03/13/19 0316 98 %     Weight 03/13/19 0311 230 lb (104.3 kg)     Height 03/13/19 0311 5\' 2"  (1.575 m)     Head Circumference --      Peak Flow --      Pain Score 03/13/19 0311 7     Pain Loc --      Pain Edu? --      Excl. in GC? --     Constitutional: Alert and oriented. Well appearing and in no acute distress.  Mildly anxious. Eyes: Conjunctivae are normal. PERRL. EOMI.  Head: Atraumatic. Nose: No congestion/rhinnorhea. Mouth/Throat: Mucous membranes are moist.  Oropharynx non-erythematous. Neck: No stridor.   Cardiovascular: Normal rate, regular rhythm. Grossly normal heart sounds.  Good peripheral circulation. Respiratory: Normal respiratory effort.  No retractions. Lungs CTAB. Gastrointestinal: Soft and nontender to light or deep palpation. No distention. No abdominal bruits. No CVA tenderness. Musculoskeletal: No lower extremity tenderness nor edema.  No joint effusions. Neurologic:  Normal speech and language. No gross focal neurologic deficits are appreciated. No gait instability. Skin:  Skin is warm, dry and intact. No rash noted. Psychiatric: Mood and affect are normal. Speech and behavior are normal.  ____________________________________________   LABS (all labs ordered are listed, but only abnormal results are displayed)  Labs Reviewed  CBC WITH DIFFERENTIAL/PLATELET - Abnormal; Notable for the following components:      Result Value   WBC 13.8 (*)    Neutro Abs 8.9 (*)    All other components within normal limits  HCG, QUANTITATIVE, PREGNANCY - Abnormal; Notable for the following components:   hCG, Beta Chain, Quant, S 581 (*)    All other components within normal limits  URINALYSIS, COMPLETE (UACMP) WITH MICROSCOPIC - Abnormal; Notable for the following components:   Color, Urine COLORLESS (*)    APPearance CLEAR (*)    Specific Gravity, Urine 1.003 (*)    Hgb urine dipstick SMALL (*)    All other components within normal limits  ABO/RH   ____________________________________________  EKG  None ____________________________________________  RADIOLOGY  ED MD interpretation: No IUP identified  Official radiology report(s): US Ob Comp Less 14 Wks  Result Date: 03/13/2019 CLINICAL DATA:  Positive pregnancy test.  Quantitative beta HCG 581. EXAM: OBSTETRIC <14 WK [place] AND TRANSVAGINAL OB US TECHNIQUE: Both transabdominal and  transvaginal ultrasound examinations were performed for complete evaluation of the gestation as well as the maternal uterus, adnexal regions, and pelvic cul-de-sac. Transvaginal technique was performed to assess early pregnancy. COMPARISON:  None. FINDINGS: Intrauterine gestational sac: None seen. Yolk sac:  None seen. Embryo:  None seen. Cardiac Activity: None seen. Subchorionic hemorrhage:  None visualized. Maternal uterus/adnexae: Adnexa are within normal limits. Ovaries are not clearly identified on the transvaginal exam. No significant free fluid is present. IMPRESSION: 1. No intrauterine pregnancy identified. No extra uterine mass lesion or fluid to suggest ectopic pregnancy. This likely represents an early pregnancy. Follow-up ultrasound may be useful as indicated. Electronically Signed   By: Marin Roberts M.D.   On: 03/13/2019 06:47   US Ob Transvaginal  Result Date: 03/13/2019 CLINICAL DATA:  Positive pregnancy test.  Quantitative beta HCG 581. EXAM: OBSTETRIC <14 WK [place] AND TRANSVAGINAL OB US TECHNIQUE: Both transabdominal and transvaginal ultrasound examinations were performed for complete evaluation of the gestation as well as the maternal uterus, adnexal regions, and pelvic cul-de-sac. Transvaginal technique was performed to assess  early pregnancy. COMPARISON:  None. FINDINGS: Intrauterine gestational sac: None seen. Yolk sac:  None seen. Embryo:  None seen. Cardiac Activity: None seen. Subchorionic hemorrhage:  None visualized. Maternal uterus/adnexae: Adnexa are within normal limits. Ovaries are not clearly identified on the transvaginal exam. No significant free fluid is present. IMPRESSION: 1. No intrauterine pregnancy identified. No extra uterine mass lesion or fluid to suggest ectopic pregnancy. This likely represents an early pregnancy. Follow-up ultrasound may be useful as indicated. Electronically Signed   By: Marin Robertshristopher  Mattern M.D.   On: 03/13/2019 06:47     ____________________________________________   PROCEDURES  Procedure(s) performed (including Critical Care):  Procedures   ____________________________________________   INITIAL IMPRESSION / ASSESSMENT AND PLAN / ED COURSE  As part of my medical decision making, I reviewed the following data within the electronic MEDICAL RECORD NUMBER Nursing notes reviewed and incorporated, Labs reviewed, Radiograph reviewed and Notes from prior ED visits        23 year old G 2P0 Ab1 approximately [redacted] weeks pregnant by dates who presents with vaginal spotting. Differential diagnosis includes, but is not limited to, ovarian cyst, ovarian torsion, acute appendicitis, diverticulitis, urinary tract infection/pyelonephritis, endometriosis, bowel obstruction, colitis, renal colic, gastroenteritis, hernia, fibroids, endometriosis, pregnancy related pain including ectopic pregnancy, etc.   Zenda E Cobb was evaluated in Emergency Department on 03/13/2019 for the symptoms described in the history of present illness. She was evaluated in the context of the global COVID-19 pandemic, which necessitated consideration that the patient might be at risk for infection with the SARS-CoV-2 virus that causes COVID-19. Institutional protocols and algorithms that pertain to the evaluation of patients at risk for COVID-19 are in a state of rapid change based on information released by regulatory bodies including the CDC and federal and state organizations. These policies and algorithms were followed during the patient's care in the ED.  Will check CBC, beta-hCG, ABO/Rh and ultrasound.   Clinical Course as of Mar 12 699  Tue Mar 13, 2019  [id number] Updated patient of US results.  Turns out [facility].  Asked her to keep her appointment.  Strict return precautions given. Patient verbalizes understanding and agrees with plan of care.   [JS]    Clinical Course User Index [JS] Irean HongSung, Caleah Tortorelli J, MD      ____________________________________________   FINAL CLINICAL IMPRESSION(S) / ED DIAGNOSES  Final diagnoses:  Vaginal bleeding in pregnancy     ED Discharge Orders    None       Note:  This document was prepared using Dragon voice recognition software and may include unintentional dictation errors.   Irean HongSung, Corinthian Kemler J, MD 03/13/19 0700

## 2019-03-13 NOTE — ED Notes (Signed)
Patient AAOX4. Vitals Stable. NAD. 

## 2019-03-14 ENCOUNTER — Other Ambulatory Visit (HOSPITAL_COMMUNITY)
Admission: RE | Admit: 2019-03-14 | Discharge: 2019-03-14 | Disposition: A | Discharge status: Home / Self Care | Payer: Managed Care, Other (non HMO) | Source: Ambulatory Visit | Attending: Obstetrics & Gynecology | Admitting: Obstetrics & Gynecology

## 2019-03-14 ENCOUNTER — Encounter: Payer: Self-pay | Admitting: Obstetrics & Gynecology

## 2019-03-14 ENCOUNTER — Ambulatory Visit (INDEPENDENT_AMBULATORY_CARE_PROVIDER_SITE_OTHER): Payer: BC Managed Care – PPO | Admitting: Obstetrics & Gynecology

## 2019-03-14 VITALS — BP 100/60 | Wt 238.0 lb

## 2019-03-14 DIAGNOSIS — O0991 Supervision of high risk pregnancy, unspecified, first trimester: Secondary | ICD-10-CM

## 2019-03-14 DIAGNOSIS — Z124 Encounter for screening for malignant neoplasm of cervix: Secondary | ICD-10-CM | POA: Insufficient documentation

## 2019-03-14 DIAGNOSIS — O99211 Obesity complicating pregnancy, first trimester: Secondary | ICD-10-CM

## 2019-03-14 DIAGNOSIS — E66813 Obesity, class 3: Secondary | ICD-10-CM | POA: Insufficient documentation

## 2019-03-14 DIAGNOSIS — F319 Bipolar disorder, unspecified: Secondary | ICD-10-CM

## 2019-03-14 DIAGNOSIS — O10911 Unspecified pre-existing hypertension complicating pregnancy, first trimester: Secondary | ICD-10-CM

## 2019-03-14 DIAGNOSIS — F3181 Bipolar II disorder: Secondary | ICD-10-CM | POA: Insufficient documentation

## 2019-03-14 DIAGNOSIS — O0990 Supervision of high risk pregnancy, unspecified, unspecified trimester: Secondary | ICD-10-CM | POA: Insufficient documentation

## 2019-03-14 DIAGNOSIS — O99341 Other mental disorders complicating pregnancy, first trimester: Secondary | ICD-10-CM

## 2019-03-14 DIAGNOSIS — O209 Hemorrhage in early pregnancy, unspecified: Secondary | ICD-10-CM

## 2019-03-14 DIAGNOSIS — O10919 Unspecified pre-existing hypertension complicating pregnancy, unspecified trimester: Secondary | ICD-10-CM | POA: Insufficient documentation

## 2019-03-14 DIAGNOSIS — Z3A01 Less than 8 weeks gestation of pregnancy: Secondary | ICD-10-CM

## 2019-03-14 LAB — POCT URINALYSIS DIPSTICK OB
Glucose, UA: NEGATIVE
POC,PROTEIN,UA: NEGATIVE

## 2019-03-14 NOTE — Progress Notes (Signed)
03/14/2019   Chief Complaint: Missed period  Transfer of Care Patient: no  History of Present Illness: [name] is a 23 y.o. G2P0010 [redacted]w[redacted]d based on Patient's last menstrual period was 02/03/2019. with an Estimated Date of Delivery: 11/10/19, with the above CC.   Her periods were: irregular periods from 26 to 30 days  RECENTLY HAD SOME BLEEDING, SEEN IN ER with BETA 581 AND [place] NORMAL, NO IUP (TOO EARLY) She was using no method when she conceived.  She has Positive signs or symptoms of nausea/vomiting of pregnancy. She has Negative signs or symptoms of miscarriage or preterm labor She identifies Negative Zika risk factors for her and her partner On any different medications around the time she conceived/early pregnancy: Yes - Toprol for HTN, Lamictal and Cymbalta for Bipolar, Hydroxyzine for sleep. History of varicella: Yes   ROS: A 12-point review of systems was performed and negative, except as stated in the above HPI.  OBGYN History: As per HPI. OB History  Gravida Para Term Preterm AB Living  2       1    SAB TAB Ectopic Multiple Live Births  1            # Outcome Date GA Lbr Len/2nd Weight Sex Delivery Anes PTL Lv  2 Current           1 SAB             Any issues with any prior pregnancies: no Any prior children are healthy, doing well, without any problems or issues: no History of pap smears: Yes. Last pap smear 2018. Abnormal: no  History of STIs: No   Past Medical History: Past Medical History:  Diagnosis Date  . Migraine   . Miscarriage     Past Surgical History: History reviewed. No pertinent surgical history.  Family History:  History reviewed. No pertinent family history. She denies any female cancers, bleeding or blood clotting disorders.  She denies any history of mental retardation, birth defects or genetic disorders in her or the FOB's history  Social History:  Social History   Socioeconomic History  . Marital status: Married    Spouse name: Not  on file  . Number of children: Not on file  . Years of education: Not on file  . Highest education level: Not on file  Occupational History  . Not on file  Social Needs  . Financial resource strain: Not on file  . Food insecurity:    Worry: Not on file    Inability: Not on file  . Transportation needs:    Medical: Not on file    Non-medical: Not on file  Tobacco Use  . Smoking status: Former Games developer  . Smokeless tobacco: Current User  Substance and Sexual Activity  . Alcohol use: Yes  . Drug use: Never  . Sexual activity: Yes    Birth control/protection: Pill  Lifestyle  . Physical activity:    Days per week: Not on file    Minutes per session: Not on file  . Stress: Not on file  Relationships  . Social connections:    Talks on phone: Not on file    Gets together: Not on file    Attends religious service: Not on file    Active member of club or organization: Not on file    Attends meetings of clubs or organizations: Not on file    Relationship status: Not on file  . Intimate partner violence:    Fear  of current or ex partner: Not on file    Emotionally abused: Not on file    Physically abused: Not on file    Forced sexual activity: Not on file  Other Topics Concern  . Not on file  Social History Narrative  . Not on file   Any pets in the household: no  Allergy: No Known Allergies  Current Outpatient Medications:  Current Outpatient Medications:  .  DULoxetine (CYMBALTA) 60 MG capsule, Take 60 mg by mouth daily., Disp: , Rfl:  .  lamoTRIgine (LAMICTAL) 200 MG tablet, Take 200 mg by mouth daily., Disp: , Rfl:  .  Prenatal Vit-Fe Fumarate-FA (PRENATAL COMPLETE PO), Take 1 tablet by mouth daily., Disp: , Rfl:  .  amphetamine-dextroamphetamine (ADDERALL) 30 MG tablet, Take 30 mg by mouth daily with breakfast. , Disp: , Rfl:  .  hydrOXYzine (ATARAX/VISTARIL) 50 MG tablet, TK 1 TO 2 TS PO HS, Disp: , Rfl:  .  LamoTRIgine 250 MG TB24 24 hour tablet, TK 1 T PO QD,  Disp: , Rfl: 2 .  metoprolol succinate (TOPROL-XL) 25 MG 24 hr tablet, Take by mouth., Disp: , Rfl:  .  naproxen (NAPROSYN) 500 MG tablet, Take 1 tablet (500 mg total) by mouth 2 (two) times daily with a meal. (Patient not taking: Reported on 03/14/2019), Disp: 20 tablet, Rfl: 0 .  norethindrone-ethinyl estradiol 1/35 (ORTHO-NOVUM, NORTREL,CYCLAFEM) tablet, Take 1 tablet by mouth daily., Disp: , Rfl:  .  QUEtiapine (SEROQUEL) 100 MG tablet, Take 100 mg by mouth at bedtime., Disp: , Rfl:  .  QUEtiapine Fumarate (SEROQUEL XR) 150 MG 24 hr tablet, TK 1 T PO HS, Disp: , Rfl: 3 .  traZODone (DESYREL) 100 MG tablet, Take 200 mg by mouth daily., Disp: , Rfl:  .  vitamin B-12 (CYANOCOBALAMIN) 1000 MCG tablet, Take 1,000 mcg by mouth daily., Disp: , Rfl:    Physical Exam:   BP 100/60   Wt 238 lb (108 kg)   LMP 02/03/2019   BMI 43.53 kg/m  Body mass index is 43.53 kg/m. Constitutional: Well nourished, well developed female in no acute distress.  Neck:  Supple, normal appearance, and no thyromegaly  Cardiovascular: S1, S2 normal, no murmur, rub or gallop, regular rate and rhythm Respiratory:  Clear to auscultation bilateral. Normal respiratory effort Abdomen: positive bowel sounds and no masses, hernias; diffusely non tender to palpation, non distended Breasts: breasts appear normal, no suspicious masses, no skin or nipple changes or axillary nodes. Neuro/Psych:  Normal mood and affect.  Skin:  Warm and dry.  Lymphatic:  No inguinal lymphadenopathy.   Pelvic exam: is not limited by body habitus EGBUS: within normal limits, Vagina: within normal limits and with no blood in the vault, Cervix: normal appearing cervix without discharge or lesions, closed/long/high, Uterus:  enlarged, c/w 5 week size, and Adnexa:  not evaluated  Assessment: Frances Lamb is a 23 y.o. G2P0010 7157w4d based on Patient's last menstrual period was 02/03/2019. with an Estimated Date of Delivery: 11/10/19,  for prenatal care.   Plan:  1) Avoid alcoholic beverages. 2) Patient encouraged not to smoke, AND ALSO TO STOP USING E-CIG's 3) Discontinue the use of all non-medicinal drugs and chemicals.  4) Take prenatal vitamins daily.  5) Seatbelt use advised 6) Nutrition, food safety (fish, cheese advisories, and high nitrite foods) and exercise discussed. 7) [facility] and [facility] discussed  8) Patient is asked about travel to areas at risk for the Zika virus, and counseled  to avoid travel and exposure to mosquitoes or sexual partners who may have themselves been exposed to the virus. Testing is discussed, and will be ordered as appropriate.  9) [facility]birth classes at [facility] advised 10) Genetic Screening, such as with 1st Trimester Screening, cell free fetal DNA, AFP testing, and Ultrasound, as well as with amniocentesis and CVS as appropriate, is discussed with patient. She plans to have genetic testing this pregnancy.  11) cHTN, CONT TOPROL FOR NOW.  RISKS OF cHTN AND PREGNANCY DISCUSSED.  RECOMMEND BABY ASPIRIN DAILY.  12) BMI 43; OBESITY RISKS ON PREGNANCY DISCUSSED; EARLY GLUCOLA, BABY ASA, THIRD TRIMESTER APT.  13) CONTINUE BIPOLAR MEDS AND THERAPY W PSYCHIATRIST ([place])  14) BETA TOMORROW DUE TO FIRST TRIMESTER BLEEDING.  RISKS OF MISCARRIAGE, OTHER ETIOLOGIES FOR BLEEDING DISCUSSED  15) PAP today  Problem list reviewed and updated.  Annamarie Major, MD, [facility], [facility] 03/14/2019  12:49 PM

## 2019-03-14 NOTE — Patient Instructions (Signed)
First Trimester of Pregnancy The first trimester of pregnancy is from week 1 until the end of week 13 (months 1 through 3). A week after a sperm fertilizes an egg, the egg will implant on the wall of the uterus. This embryo will begin to develop into a baby. Genes from you and your partner will form the baby. The female genes will determine whether the baby will be a boy or a girl. At 6-8 weeks, the eyes and face will be formed, and the heartbeat can be seen on ultrasound. At the end of 12 weeks, all the baby's organs will be formed. Now that you are pregnant, you will want to do everything you can to have a healthy baby. Two of the most important things are to get good prenatal care and to follow your health care provider's instructions. Prenatal care is all the medical care you receive before the baby's birth. This care will help prevent, find, and treat any problems during the pregnancy and childbirth. Body changes during your first trimester Your body goes through many changes during pregnancy. The changes vary from woman to woman.  You may gain or lose a couple of pounds at first.  You may feel sick to your stomach (nauseous) and you may throw up (vomit). If the vomiting is uncontrollable, call your health care provider.  You may tire easily.  You may develop headaches that can be relieved by medicines. All medicines should be approved by your health care provider.  You may urinate more often. Painful urination may mean you have a bladder infection.  You may develop heartburn as a result of your pregnancy.  You may develop constipation because certain hormones are causing the muscles that push stool through your intestines to slow down.  You may develop hemorrhoids or swollen veins (varicose veins).  Your breasts may begin to grow larger and become tender. Your nipples may stick out more, and the tissue that surrounds them (areola) may become darker.  Your gums may bleed and may be  sensitive to brushing and flossing.  Dark spots or blotches (chloasma, mask of pregnancy) may develop on your face. This will likely fade after the baby is born.  Your menstrual periods will stop.  You may have a loss of appetite.  You may develop cravings for certain kinds of food.  You may have changes in your emotions from day to day, such as being excited to be pregnant or being concerned that something may go wrong with the pregnancy and baby.  You may have more vivid and strange dreams.  You may have changes in your hair. These can include thickening of your hair, rapid growth, and changes in texture. Some women also have hair loss during or after pregnancy, or hair that feels dry or thin. Your hair will most likely return to normal after your baby is born. What to expect at prenatal visits During a routine prenatal visit:  You will be weighed to make sure you and the baby are growing normally.  Your blood pressure will be taken.  Your abdomen will be measured to track your baby's growth.  The fetal heartbeat will be listened to between weeks 10 and 14 of your pregnancy.  Test results from any previous visits will be discussed. Your health care provider may ask you:  How you are feeling.  If you are feeling the baby move.  If you have had any abnormal symptoms, such as leaking fluid, bleeding, severe headaches, or abdominal  cramping.  If you are using any tobacco products, including cigarettes, chewing tobacco, and electronic cigarettes.  If you have any questions. Other tests that may be performed during your first trimester include:  Blood tests to find your blood type and to check for the presence of any previous infections. The tests will also be used to check for low iron levels (anemia) and protein on red blood cells (Rh antibodies). Depending on your risk factors, or if you previously had diabetes during pregnancy, you may have tests to check for high blood sugar  that affects pregnant women (gestational diabetes).  Urine tests to check for infections, diabetes, or protein in the urine.  An ultrasound to confirm the proper growth and development of the baby.  Fetal screens for spinal cord problems (spina bifida) and Down syndrome.  HIV (human immunodeficiency virus) testing. Routine prenatal testing includes screening for HIV, unless you choose not to have this test.  You may need other tests to make sure you and the baby are doing well. Follow these instructions at home: Medicines  Follow your health care provider's instructions regarding medicine use. Specific medicines may be either safe or unsafe to take during pregnancy.  Take a prenatal vitamin that contains at least 600 micrograms (mcg) of folic acid.  If you develop constipation, try taking a stool softener if your health care provider approves. Eating and drinking   Eat a balanced diet that includes fresh fruits and vegetables, whole grains, good sources of protein such as meat, eggs, or tofu, and low-fat dairy. Your health care provider will help you determine the amount of weight gain that is right for you.  Avoid raw meat and uncooked cheese. These carry germs that can cause birth defects in the baby.  Eating four or five small meals rather than three large meals a day may help relieve nausea and vomiting. If you start to feel nauseous, eating a few soda crackers can be helpful. Drinking liquids between meals, instead of during meals, also seems to help ease nausea and vomiting.  Limit foods that are high in fat and processed sugars, such as fried and sweet foods.  To prevent constipation: ? Eat foods that are high in fiber, such as fresh fruits and vegetables, whole grains, and beans. ? Drink enough fluid to keep your urine clear or pale yellow. Activity  Exercise only as directed by your health care provider. Most women can continue their usual exercise routine during  pregnancy. Try to exercise for 30 minutes at least 5 days a week. Exercising will help you: ? Control your weight. ? Stay in shape. ? Be prepared for labor and delivery.  Experiencing pain or cramping in the lower abdomen or lower back is a good sign that you should stop exercising. Check with your health care provider before continuing with normal exercises.  Try to avoid standing for long periods of time. Move your legs often if you must stand in one place for a long time.  Avoid heavy lifting.  Wear low-heeled shoes and practice good posture.  You may continue to have sex unless your health care provider tells you not to. Relieving pain and discomfort  Wear a good support bra to relieve breast tenderness.  Take warm sitz baths to soothe any pain or discomfort caused by hemorrhoids. Use hemorrhoid cream if your health care provider approves.  Rest with your legs elevated if you have leg cramps or low back pain.  If you develop varicose veins in  your legs, wear support hose. Elevate your feet for 15 minutes, 3-4 times a day. Limit salt in your diet. Prenatal care  Schedule your prenatal visits by the twelfth week of pregnancy. They are usually scheduled monthly at first, then more often in the last 2 months before delivery.  Write down your questions. Take them to your prenatal visits.  Keep all your prenatal visits as told by your health care provider. This is important. Safety  Wear your seat belt at all times when driving.  Make a list of emergency phone numbers, including numbers for family, friends, the hospital, and police and fire departments. General instructions  Ask your health care provider for a referral to a local prenatal education class. Begin classes no later than the beginning of month 6 of your pregnancy.  Ask for help if you have counseling or nutritional needs during pregnancy. Your health care provider can offer advice or refer you to specialists for help  with various needs.  Do not use hot tubs, steam rooms, or saunas.  Do not douche or use tampons or scented sanitary pads.  Do not cross your legs for long periods of time.  Avoid cat litter boxes and soil used by cats. These carry germs that can cause birth defects in the baby and possibly loss of the fetus by miscarriage or stillbirth.  Avoid all smoking, herbs, alcohol, and medicines not prescribed by your health care provider. Chemicals in these products affect the formation and growth of the baby.  Do not use any products that contain nicotine or tobacco, such as cigarettes and e-cigarettes. If you need help quitting, ask your health care provider. You may receive counseling support and other resources to help you quit.  Schedule a dentist appointment. At home, brush your teeth with a soft toothbrush and be gentle when you floss. Contact a health care provider if:  You have dizziness.  You have mild pelvic cramps, pelvic pressure, or nagging pain in the abdominal area.  You have persistent nausea, vomiting, or diarrhea.  You have a bad smelling vaginal discharge.  You have pain when you urinate.  You notice increased swelling in your face, hands, legs, or ankles.  You are exposed to fifth disease or chickenpox.  You are exposed to [place] measles (rubella) and have never had it. Get help right away if:  You have a fever.  You are leaking fluid from your vagina.  You have spotting or bleeding from your vagina.  You have severe abdominal cramping or pain.  You have rapid weight gain or loss.  You vomit blood or material that looks like coffee grounds.  You develop a severe headache.  You have shortness of breath.  You have any kind of trauma, such as from a fall or a car accident. Summary  The first trimester of pregnancy is from week 1 until the end of week 13 (months 1 through 3).  Your body goes through many changes during pregnancy. The changes vary from  woman to woman.  You will have routine prenatal visits. During those visits, your health care provider will examine you, discuss any test results you may have, and talk with you about how you are feeling. This information is not intended to replace advice given to you by your health care provider. Make sure you discuss any questions you have with your health care provider. Document Released: 11/09/2001 Document Revised: 10/27/2016 Document Reviewed: 10/27/2016 Elsevier Interactive Patient Education  2019 [employer].  Hello,  Given the current COVID-19 pandemic, our practice is making changes in how we are providing care to our patients. We are limiting in-person visits for the safety of all of our patients.   As a practice, we have met to discuss the best way to minimize visits, but still provide excellent care to our expecting mothers.  We have decided on the following visit structure for low-risk pregnancies.  Initial Pregnancy visit will be conducted as a telephone or web visit.  Between 10-14 weeks  there will be one in-person visit for an ultrasound, lab work, and genetic screening. 20 weeks in-person visit with an anatomy ultrasound  28 weeks in-person office visit for a 1-hour glucose test and a TDAP vaccination 32 weeks in-person office visit 34 weeks telephone visit 36 weeks in-person office visit for GBS, chlamydia, and gonorrhea testing 38 weeks in-person office visit 40 weeks in-person office visit  Understandably, some patients will require more visits than what is outlined above. Additional visits will be determined on a case-by-case basis.   We will, as always, be available for emergencies or to address concerns that might arise between in-person visits. We ask that you allow [place] the opportunity to address any concerns over the phone or through a virtual visit first. We will be available to return your phone calls throughout the day.   If you are able to purchase a  scale, a blood pressure machine, and a home fetal doppler visits could be limited further. This will help decrease your exposure risks, but these purchases are not a necessity.   Things seem to change daily and there is the possibility that this structure could change, please be patient as we adapt to a new way of caring for patients.   Thank you for trusting [place] with your prenatal care. Our practice values you and looks forward to providing you with excellent care.   Sincerely,   [facility], [place]    COVID-19 and Your Pregnancy FAQ  How can I prevent infection with COVID-19 during my pregnancy? Social distancing is key. Please limit any interactions in public. Try and work from home if possible. Frequently wash your hands after touching possibly contaminated surfaces. Avoid touching your face.  Minimize trips to the store. Consider online ordering when possible.   Should I wear a mask? YES. It is recommended by the CDC that all people wear a cloth mask or facial covering in public. This will help reduce transmission as well as your risk or acquiring COVID-19. New studies are showing that even asymptomatic individuals can spread the virus from talking.   What are the symptoms of COVID-19? Fever (greater than 100.4 F), dry cough, shortness of breath.  Am I more at risk for COVID-19 since I am pregnant? There is not currently data showing that pregnant women are more adversely impacted by COVID-19 than the general population. However, we know that pregnant women tend to have worse respiratory complications from similar diseases such as the flu and SARS and for this reason should be considered an at-risk population.  What do I do if I am experiencing the symptoms of COVID-19? Testing is being limited because of test availability. If you are experiencing symptoms you should quarantine yourself, and the members of your family, for at least 2 weeks at home.   Please  visit this website for more information: [email or web address]  When should I go to the Emergency Room? Please go to the emergency room if you  are experiencing ANY of these symptoms*:  1.    Difficulty breathing or shortness of breath 2.    Persistent pain or pressure in the chest 3.    Confusion or difficulty being aroused (or awakened) 4.    Bluish lips or face  *This list is not all inclusive. Please consult our office for any other symptoms that are severe or concerning.  What do I do if I am having difficulty breathing? You should go to the Emergency Room for evaluation. At this time they have a tent set up for evaluating patients with COVID-19 symptoms.   How will my prenatal care be different because of the COVID-19 pandemic? It has been recommended to reduce the frequency of face-to-face visits and use resources such as telephone and virtual visits when possible. Using a scale, blood pressure machine and fetal doppler at home can further help reduce face-to-face visits. You will be provided with additional information on this topic.  We ask that you come to your visits alone to minimize potential exposures to  COVID-19.  How can I receive childbirth education? At this time in-person classes have been cancelled. You can register for online childbirth education, breastfeeding, and newborn care classes.  Please visit:  [email or web address] for more information  How will my hospital birth experience be different? The hospital is currently limiting visitors. This means that while you are in labor you can only have one person at the hospital with you. Additional family members will not be allowed to wait in the building or outside your room. Your one support person can be the father of the baby, a relative, a doula, or a friend. Once one support person is designated that person will wear a band. This band cannot be shared with  multiple people.  Nitrous Gas is not being offered for pain relief since the tubing and filter for the machine can not be sanitized in a way to guarantee prevention of transmission of COVID-19.  Nasal cannula use of oxygen for fetal indications has also been discontinued.  Currently a clear plastic sheet is being hung between mom and the delivering provider during pushing and delivery to help prevent transmission of COVID-19.      How long will I stay in the hospital for after giving birth? It is also recommended that discharge home be expedited during the COVID-19 outbreak. This means staying for 1 day after a vaginal delivery and 2 days after a cesarean section. Patients who need to stay longer for medical reasons are allowed to do so, but the goal will be for expedited discharge home.   What if I have COVID-19 and I am in labor? We ask that you wear a mask while on labor and delivery. We will try and accommodate you being placed in a room that is capable of filtering the air. Please call ahead if you are in labor and on your way to the hospital. The phone number for labor and delivery at [facility] is [phone].  If I have COVID-19 when my baby is born how can I prevent my baby from contracting COVID-19? This is an issue that will have to be discussed on a case-by-case basis. Current recommendations suggest providing separate isolation rooms for both the mother and new infant as well as limiting visitors. However, there are practical challenges to this recommendation. The situation will assuredly change and decisions will be influenced by the desires of the mother and availability of space.  Some  suggestions are the use of a curtain or physical barrier between mom and infant, hand hygiene, mom wearing a mask, or 6 feet of spacing between a mom and infant.   Can I breastfeed during the COVID-19 pandemic?   Yes, breastfeeding is encouraged.  Can I breastfeed if I have  COVID-19? Yes. Covid-19 has not been found in breast milk. This means you cannot give COVID-19 to your child through breast milk. Breast feeding will also help pass antibodies to fight infection to your baby.   What precautions should I take when breastfeeding if I have COVID-19? If a mother and newborn do room-in and the mother wishes to feed at the breast, she should put on a facemask and practice hand hygiene before each feeding.  What precautions should I take when pumping if I have COVID-19? Prior to expressing breast milk, mothers should practice hand hygiene. After each pumping session, all parts that come into contact with breast milk should be thoroughly washed and the entire pump should be appropriately disinfected per the manufacturers instructions. This expressed breast milk should be fed to the newborn by a healthy caregiver.  What if I am pregnant and work in healthcare? Based on limited data regarding COVID-19 and pregnancy, ACOG currently does not propose creating additional restrictions on pregnant health care personnel because of COVID-19 alone. Pregnant women do not appear to be at higher risk of severe disease related to COVID-19. Pregnant health care personnel should follow CDC risk assessment and infection control guidelines for health care personnel exposed to patients with suspected or confirmed COVID-19. Adherence to recommended infection prevention and control practices is an important part of protecting all health care personnel in health care settings.    Information on COVID-19 in pregnancy is very limited; however, facilities may want to consider limiting exposure of pregnant health care personnel to patients with confirmed or suspected COVID-19 infection, especially during higher-risk procedures (eg, aerosol-generating procedures), if feasible, based on staffing availability.

## 2019-03-15 ENCOUNTER — Other Ambulatory Visit: Payer: Managed Care, Other (non HMO)

## 2019-03-15 ENCOUNTER — Other Ambulatory Visit: Payer: Self-pay

## 2019-03-15 DIAGNOSIS — O209 Hemorrhage in early pregnancy, unspecified: Secondary | ICD-10-CM

## 2019-03-15 DIAGNOSIS — Z3A01 Less than 8 weeks gestation of pregnancy: Secondary | ICD-10-CM

## 2019-03-15 LAB — OB RESULTS CONSOLE VARICELLA ZOSTER ANTIBODY, IGG: Varicella: NON-IMMUNE/NOT IMMUNE

## 2019-03-15 LAB — CYTOLOGY - PAP
Chlamydia: NEGATIVE
Diagnosis: NEGATIVE
Neisseria Gonorrhea: NEGATIVE
Trichomonas: NEGATIVE

## 2019-03-16 ENCOUNTER — Other Ambulatory Visit: Payer: Self-pay | Admitting: Obstetrics & Gynecology

## 2019-03-16 DIAGNOSIS — Z3491 Encounter for supervision of normal pregnancy, unspecified, first trimester: Secondary | ICD-10-CM

## 2019-03-16 DIAGNOSIS — O209 Hemorrhage in early pregnancy, unspecified: Secondary | ICD-10-CM

## 2019-03-16 LAB — RPR+RH+ABO+RUB AB+AB SCR+CB...
Antibody Screen: NEGATIVE
HIV Screen 4th Generation wRfx: NONREACTIVE
Hematocrit: 39.1 % (ref 34.0–46.6)
Hemoglobin: 13 g/dL (ref 11.1–15.9)
Hepatitis B Surface Ag: NEGATIVE
MCH: 29.2 pg (ref 26.6–33.0)
MCHC: 33.2 g/dL (ref 31.5–35.7)
MCV: 88 fL (ref 79–97)
Platelets: 332 10*3/uL (ref 150–450)
RBC: 4.45 x10E6/uL (ref 3.77–5.28)
RDW: 12.6 % (ref 11.7–15.4)
RPR Ser Ql: NONREACTIVE
Rh Factor: POSITIVE
Rubella Antibodies, IGG: 1.1 index (ref >0.99)
Varicella zoster IgG: <135 index — ABNORMAL LOW (ref >165)
WBC: 8.9 10*3/uL (ref 3.4–10.8)

## 2019-03-16 LAB — URINE CULTURE

## 2019-03-16 LAB — BETA HCG QUANT (REF LAB): hCG Quant: 1404 m[IU]/mL

## 2019-03-16 MED ORDER — NITROFURANTOIN MONOHYD MACRO 100 MG PO CAPS: 100.0000 mg | ORAL_CAPSULE | Freq: Two times a day (BID) | ORAL | 0 refills | Status: DC

## 2019-03-20 ENCOUNTER — Other Ambulatory Visit: Payer: Self-pay

## 2019-03-20 ENCOUNTER — Emergency Department: Payer: Managed Care, Other (non HMO)

## 2019-03-20 ENCOUNTER — Encounter: Payer: Self-pay | Admitting: Emergency Medicine

## 2019-03-20 ENCOUNTER — Emergency Department
Admission: EM | Admit: 2019-03-20 | Discharge: 2019-03-21 | Disposition: A | Discharge status: Home / Self Care | Payer: Managed Care, Other (non HMO) | Attending: Emergency Medicine | Admitting: Emergency Medicine

## 2019-03-20 DIAGNOSIS — M79605 Pain in left leg: Secondary | ICD-10-CM | POA: Insufficient documentation

## 2019-03-20 DIAGNOSIS — R609 Edema, unspecified: Secondary | ICD-10-CM | POA: Diagnosis not present

## 2019-03-20 DIAGNOSIS — Z87891 Personal history of nicotine dependence: Secondary | ICD-10-CM | POA: Insufficient documentation

## 2019-03-20 NOTE — ED Triage Notes (Signed)
Patient ambulatory to triage with steady gait, without difficulty or distress noted; pt reports left leg swelling x ; denies any pain or any accomp symptoms; pt approx [redacted]wks pregnant

## 2019-03-21 NOTE — Discharge Instructions (Signed)
Your workup in the Emergency Department today was reassuring.  We did not find any specific abnormalities.  We recommend you drink plenty of fluids, take your regular medications and/or any new ones prescribed today, and follow up with the doctor(s) listed in these documents as recommended.  Return to the Emergency Department if you develop new or worsening symptoms that concern you.  

## 2019-03-21 NOTE — ED Provider Notes (Signed)
[facility] Emergency Department Provider Note  ____________________________________________   First MD Initiated Contact with Patient 03/20/19 2314     (approximate)  I have reviewed the triage vital signs and the nursing notes.   HISTORY  Chief Complaint Leg Swelling    HPI Frances Lamb is a 23 y.o. female with medical history as listed below who is a G2 P0 Ab1  who presents for evaluation of acute onset mild swelling to the upper part of her left calf.  It started about 30 minutes prior to arrival.  She felt like her leg was a purple color as well.  She has had no injury to her leg.  She has no prior history of blood clot in her legs or her lungs and has no bleeding disorders of which she is aware.  She has had no signs of infection including fever/chills, sore throat, nasal congestion, chest pain, shortness of breath, cough, nausea, vomiting, abdominal pain, and dysuria.  She has no pain in her leg and it does not feel tight, weak, nor numb.  Her foot feels normal.  She is not certain about what she was concerned but felt like she should get the swelling checked out.  She has not had similar problems in the past.  She believes that she is about [redacted] weeks pregnant.        Past Medical History:  Diagnosis Date  . Migraine   . Miscarriage     Patient Active Problem List   Diagnosis Date Noted  . Chronic hypertension affecting pregnancy 03/14/2019  . High-risk pregnancy, first trimester 03/14/2019  . Bipolar disease during pregnancy in first trimester (HCC) 03/14/2019  . Obesity, Class III, BMI 40-49.9 (morbid obesity) (HCC) 03/14/2019    History reviewed. No pertinent surgical history.  Prior to Admission medications   Medication Sig Start Date End Date Taking? Authorizing Provider  DULoxetine (CYMBALTA) 60 MG capsule Take 60 mg by mouth daily.    [provider]  hydrOXYzine (ATARAX/VISTARIL) 50 MG tablet TK 1 TO 2 TS PO HS 03/08/19    [provider]  lamoTRIgine (LAMICTAL) 200 MG tablet Take 200 mg by mouth daily. 08/07/15   [provider]  LamoTRIgine 250 MG TB24 24 hour tablet TK 1 T PO QD 05/16/18   [provider]  metoprolol succinate (TOPROL-XL) 25 MG 24 hr tablet Take by mouth. 01/17/18 01/17/19  [provider]  nitrofurantoin, macrocrystal-monohydrate, (MACROBID) 100 MG capsule Take 1 capsule (100 mg total) by mouth 2 (two) times daily. 03/16/19   Nadara Mustard, MD  Prenatal Vit-Fe Fumarate-FA (PRENATAL COMPLETE PO) Take 1 tablet by mouth daily.    [provider]  vitamin B-12 (CYANOCOBALAMIN) 1000 MCG tablet Take 1,000 mcg by mouth daily.    [provider]    Allergies Patient has no known allergies.  No family history on file.  Social History Social History   Tobacco Use  . Smoking status: Former Games developer  . Smokeless tobacco: Current User  Substance Use Topics  . Alcohol use: Yes  . Drug use: Never    Review of Systems Constitutional: No fever/chills Eyes: No visual changes. ENT: No sore throat. Cardiovascular: Denies chest pain. Respiratory: Denies shortness of breath. Gastrointestinal: No abdominal pain.  No nausea, no vomiting.  No diarrhea.  No constipation. Genitourinary: Negative for dysuria. Musculoskeletal: Acute onset mild swelling to the left leg as described above.  Negative for neck pain.  Negative for back pain. Integumentary:  Negative for rash. Neurological: Negative for headaches, focal weakness or numbness.   ____________________________________________   PHYSICAL EXAM:  VITAL SIGNS: ED Triage Vitals  Enc Vitals Group     BP 03/20/19 2253 133/85     Pulse Rate 03/20/19 2253 (!) 110     Resp 03/20/19 2253 16     Temp 03/20/19 2253 98.2 F (36.8 C)     Temp Source 03/20/19 2253 Oral     SpO2 03/20/19 2313 97 %     Weight 03/20/19 2254 108 kg (238 lb 1.6 oz)     Height 03/20/19 2254 1.575 m (5\' 2" )     Head  Circumference --      Peak Flow --      Pain Score 03/20/19 2254 0     Pain Loc --      Pain Edu? --      Excl. in GC? --     Constitutional: Alert and oriented. Well appearing and in no acute distress. Eyes: Conjunctivae are normal.  Head: Atraumatic. Nose: No congestion/rhinnorhea. Mouth/Throat: Mucous membranes are moist. Neck: No stridor.  No meningeal signs.   Cardiovascular: Normal rate, regular rhythm. Good peripheral circulation. Grossly normal heart sounds. Respiratory: Normal respiratory effort.  No retractions. No audible wheezing. Gastrointestinal: Soft and nontender. No distention.  Musculoskeletal: There is no appreciable swelling and no peripheral edema in either leg.  Her left and right leg are equal in appearance.  She has normal capillary refill in both extremities including her feet and normal palpable pulses in the feet.  No gross deformities, no palpable "lumps" in the vascular distribution behind the left knee. Neurologic:  Normal speech and language. No gross focal neurologic deficits are appreciated.  Skin: The patient is skin is warm and dry and equal in both legs.  She does have somewhat of a mottled appearance in bilateral legs but the feet are normal and there is no purpura or petechiae or lesions of any kind on her legs.  No indication of cellulitis or infection.  No tenderness to palpation. Psychiatric: Mood and affect are normal. Speech and behavior are normal.  ____________________________________________   LABS (all labs ordered are listed, but only abnormal results are displayed)  Labs Reviewed - No data to display ____________________________________________  EKG  No indication for EKG ____________________________________________  RADIOLOGY   ED MD interpretation: No indication of DVT  Official radiology report(s): [place] Venous Img Lower Unilateral Left  Result Date: 03/21/2019 CLINICAL DATA:  Left lower extremity swelling. EXAM: LEFT LOWER  EXTREMITY VENOUS DOPPLER ULTRASOUND TECHNIQUE: Gray-scale sonography with graded compression, as well as color Doppler and duplex ultrasound were performed to evaluate the lower extremity deep venous systems from the level of the common femoral vein and including the common femoral, femoral, profunda femoral, popliteal and calf veins including the posterior tibial, peroneal and gastrocnemius veins when visible. The superficial great saphenous vein was also interrogated. Spectral Doppler was utilized to evaluate flow at rest and with distal augmentation maneuvers in the common femoral, femoral and popliteal veins. COMPARISON:  None. FINDINGS: Contralateral Common Femoral Vein: Respiratory phasicity is normal and symmetric with the symptomatic side. No evidence of thrombus. Normal compressibility. Common Femoral Vein: No evidence of thrombus. Normal compressibility, respiratory phasicity and response to augmentation. Saphenofemoral Junction: No evidence of thrombus. Normal compressibility and flow on color Doppler imaging. Profunda Femoral Vein: No evidence of thrombus. Normal compressibility and flow on color Doppler imaging. Femoral Vein: No evidence of thrombus. Normal compressibility, respiratory phasicity  and response to augmentation. Popliteal Vein: No evidence of thrombus. Normal compressibility, respiratory phasicity and response to augmentation. Calf Veins: No evidence of thrombus. Normal compressibility and flow on color Doppler imaging. Superficial Great Saphenous Vein: No evidence of thrombus. Normal compressibility. Venous Reflux:  None. Other Findings:  None. IMPRESSION: No evidence of deep venous thrombosis. Electronically Signed   By: Charlett Nose M.D.   On: 03/21/2019 00:25    ____________________________________________   PROCEDURES   Procedure(s) performed (including Critical Care):  Procedures   ____________________________________________   INITIAL IMPRESSION / MDM / ASSESSMENT  AND PLAN / ED COURSE  As part of my medical decision making, I reviewed the following data within the electronic MEDICAL RECORD NUMBER Nursing notes reviewed and incorporated, Old chart reviewed, Notes from prior ED visits and [place] Controlled Substance Database      Mashell Sieben Cobb was evaluated in Emergency Department on 03/21/2019 for the symptoms described in the history of present illness. She was evaluated in the context of the global COVID-19 pandemic, which necessitated consideration that the patient might be at risk for infection with the SARS-CoV-2 virus that causes COVID-19. Institutional protocols and algorithms that pertain to the evaluation of patients at risk for COVID-19 are in a state of rapid change based on information released by regulatory bodies including the CDC and federal and state organizations. These policies and algorithms were followed during the patient's care in the ED.  Differential diagnosis includes, but is not limited to, nonspecific and benign peripheral edema, musculoskeletal strain, Baker's cyst, DVT.  The patient is well-appearing and in no distress.  She is ambulating without any difficulty.  Even though she reports some left lower extremity swelling, she has normal-appearing legs that are equal with no difference that I can appreciate between the left and the right.  She has no peripheral edema, normal capillary refill and palpable pulses in both feet, and no sign of infection.  Given her pregnancy, even though it is early, I obtained an ultrasound to rule out DVT which was negative.  I recommended the patient elevate her legs when possible and follow-up with her OB/GYN as planned.  There is no indication for further intervention at this time and she understands and agrees with the plan.  She has stable vital signs and although she is persistently mildly tachycardic there is no sign of infection and she has no known exposures to COVID-19.       ____________________________________________  FINAL CLINICAL IMPRESSION(S) / ED DIAGNOSES  Final diagnoses:  Peripheral edema     MEDICATIONS GIVEN DURING THIS VISIT:  Medications - No data to display   ED Discharge Orders    None       Note:  This document was prepared using Dragon voice recognition software and may include unintentional dictation errors.   Loleta Rose, MD 03/21/19 [id number]

## 2019-03-27 ENCOUNTER — Encounter: Payer: Self-pay | Admitting: Obstetrics & Gynecology

## 2019-03-27 ENCOUNTER — Other Ambulatory Visit: Payer: Self-pay | Admitting: Obstetrics & Gynecology

## 2019-03-27 MED ORDER — ONDANSETRON 4 MG PO TBDP: 4.0000 mg | ORAL_TABLET | Freq: Four times a day (QID) | ORAL | 0 refills | Status: DC | PRN

## 2019-03-27 NOTE — Telephone Encounter (Signed)
Can you send in nausea rx?

## 2019-03-28 ENCOUNTER — Ambulatory Visit
Admission: RE | Admit: 2019-03-28 | Discharge: 2019-03-28 | Disposition: A | Discharge status: Home / Self Care | Payer: Managed Care, Other (non HMO) | Source: Ambulatory Visit | Attending: Obstetrics & Gynecology | Admitting: Obstetrics & Gynecology

## 2019-03-28 ENCOUNTER — Encounter: Payer: Self-pay | Admitting: Obstetrics & Gynecology

## 2019-03-28 ENCOUNTER — Ambulatory Visit (INDEPENDENT_AMBULATORY_CARE_PROVIDER_SITE_OTHER): Payer: BC Managed Care – PPO | Admitting: Obstetrics & Gynecology

## 2019-03-28 ENCOUNTER — Other Ambulatory Visit: Payer: Self-pay

## 2019-03-28 VITALS — BP 100/60 | Wt 240.0 lb

## 2019-03-28 DIAGNOSIS — Z1379 Encounter for other screening for genetic and chromosomal anomalies: Secondary | ICD-10-CM

## 2019-03-28 DIAGNOSIS — O209 Hemorrhage in early pregnancy, unspecified: Secondary | ICD-10-CM | POA: Insufficient documentation

## 2019-03-28 DIAGNOSIS — Z3A01 Less than 8 weeks gestation of pregnancy: Secondary | ICD-10-CM

## 2019-03-28 DIAGNOSIS — Z3491 Encounter for supervision of normal pregnancy, unspecified, first trimester: Secondary | ICD-10-CM | POA: Diagnosis present

## 2019-03-28 DIAGNOSIS — Z131 Encounter for screening for diabetes mellitus: Secondary | ICD-10-CM

## 2019-03-28 DIAGNOSIS — O10919 Unspecified pre-existing hypertension complicating pregnancy, unspecified trimester: Secondary | ICD-10-CM

## 2019-03-28 DIAGNOSIS — O10911 Unspecified pre-existing hypertension complicating pregnancy, first trimester: Secondary | ICD-10-CM

## 2019-03-28 DIAGNOSIS — O0991 Supervision of high risk pregnancy, unspecified, first trimester: Secondary | ICD-10-CM

## 2019-03-28 LAB — POCT URINALYSIS DIPSTICK OB
Glucose, UA: NEGATIVE
POC,PROTEIN,UA: NEGATIVE

## 2019-03-28 NOTE — Patient Instructions (Signed)

## 2019-03-28 NOTE — Progress Notes (Signed)
  HPI: Pt is early pregnancy with nausea, also some mood adjustemnts to a change in her medicines for bipolar.  She is otherwise doing well.  Ultrasound demonstrates IUP, no SCH, change in EDC to 11/17/2019. These findings are reassuring  PMHx: She  has a past medical history of Migraine and Miscarriage. Also,  has no past surgical history on file., family history is not on file.,  reports that she has quit smoking. She uses smokeless tobacco. She reports current alcohol use. She reports that she does not use drugs.  She has a current medication list which includes the following prescription(s): duloxetine, hydroxyzine, lamotrigine, lamotrigine, metoprolol succinate, nitrofurantoin (macrocrystal-monohydrate), ondansetron, prenatal vit-fe fumarate-fa, and vitamin b-12. Also, has No Known Allergies.  Review of Systems  All other systems reviewed and are negative.   Objective: BP 100/60   Wt 240 lb (108.9 kg)   LMP 02/03/2019   BMI 43.90 kg/m   Physical examination Constitutional NAD, Conversant  Skin No rashes, lesions or ulceration.   Extremities: Moves all appropriately.  Normal ROM for age. No lymphadenopathy.  Neuro: Grossly intact  Psych: Oriented to PPT.  Normal mood. Normal affect.   US Ob Less Than 14 Weeks With Ob Transvaginal  Result Date: 03/28/2019 CLINICAL DATA:  Patient with the irregular menstruation. EXAM: OBSTETRIC <14 WK [place] AND TRANSVAGINAL OB US TECHNIQUE: Both transabdominal and transvaginal ultrasound examinations were performed for complete evaluation of the gestation as well as the maternal uterus, adnexal regions, and pelvic cul-de-sac. Transvaginal technique was performed to assess early pregnancy. COMPARISON:  None. FINDINGS: Intrauterine gestational sac: Single Yolk sac:  Visualized. Embryo:  Visualized. Cardiac Activity: Visualized. Heart Rate: 133 bpm CRL:  7 mm   6 w   4 d                  [place] EDC: 11/17/2019 Subchorionic hemorrhage:  None visualized.  Maternal uterus/adnexae: Normal right and left ovaries. No free fluid in the pelvis. IMPRESSION: Single live intrauterine gestation.  No subchorionic hemorrhage. Electronically Signed   By: Annia Belt M.D.   On: 03/28/2019 15:05    Assessment:  Screening for diabetes mellitus - Plan: Glucose, 1 hour gestational, next visit  Encounter for genetic screening for Down Syndrome - Plan: MaterniT21 PLUS Core+SCA next visit  [redacted] weeks gestation of pregnancy    PNV  High-risk pregnancy, first trimester    Chronic hypertension affecting pregnancy    Normal BP on Toprol currently  Obesity and pregnancy  Annamarie Major, MD, [facility], [facility] 03/28/2019  3:40 PM

## 2019-03-28 NOTE — Addendum Note (Signed)
Addended by: Cornelius Moras D on: 03/28/2019 03:55 PM   Modules accepted: Orders

## 2019-04-04 ENCOUNTER — Encounter: Payer: Self-pay | Admitting: Obstetrics & Gynecology

## 2019-04-25 ENCOUNTER — Other Ambulatory Visit: Payer: Self-pay

## 2019-04-25 ENCOUNTER — Encounter: Payer: Self-pay | Admitting: Maternal Newborn

## 2019-04-25 ENCOUNTER — Other Ambulatory Visit: Payer: BC Managed Care – PPO

## 2019-04-25 ENCOUNTER — Ambulatory Visit (INDEPENDENT_AMBULATORY_CARE_PROVIDER_SITE_OTHER): Payer: BC Managed Care – PPO | Admitting: Maternal Newborn

## 2019-04-25 VITALS — BP 120/80 | Wt 242.0 lb

## 2019-04-25 DIAGNOSIS — Z131 Encounter for screening for diabetes mellitus: Secondary | ICD-10-CM

## 2019-04-25 DIAGNOSIS — O99211 Obesity complicating pregnancy, first trimester: Secondary | ICD-10-CM

## 2019-04-25 DIAGNOSIS — O0991 Supervision of high risk pregnancy, unspecified, first trimester: Secondary | ICD-10-CM

## 2019-04-25 DIAGNOSIS — Z1379 Encounter for other screening for genetic and chromosomal anomalies: Secondary | ICD-10-CM

## 2019-04-25 DIAGNOSIS — Z3A10 10 weeks gestation of pregnancy: Secondary | ICD-10-CM

## 2019-04-25 NOTE — Progress Notes (Signed)
    Routine Prenatal Care Visit  Subjective  Frances Lamb is a 23 y.o. G2P0010 at [redacted]w[redacted]d being seen today for ongoing prenatal care.  She is currently monitored for the following issues for this high-risk pregnancy and has Chronic hypertension affecting pregnancy; High-risk pregnancy, first trimester; Bipolar disease during pregnancy in first trimester (HCC); and Obesity, Class III, BMI 40-49.9 (morbid obesity) (HCC) on their problem list.  ----------------------------------------------------------------------------------- Patient reports no complaints.   Vag. Bleeding: None. ----------------------------------------------------------------------------------- The following portions of the patient's history were reviewed and updated as appropriate: allergies, current medications, past family history, past medical history, past social history, past surgical history and problem list. Problem list updated.    Objective  Blood pressure 120/80, weight 242 lb (109.8 kg), last menstrual period 02/03/2019. Pregravid weight 230 lb (104.3 kg) Total Weight Gain 12 lb (5.443 kg) Urinalysis: Urine dipstick shows negative for glucose, protein.   General:  Alert, oriented and cooperative. Patient is in no acute distress.  Skin: Skin is warm and dry. No rash noted.   Cardiovascular: Normal heart rate noted  Respiratory: Normal respiratory effort, no problems with respiration noted  Abdomen: Soft, gravid, appropriate for gestational age. Pain/Pressure: Absent     Pelvic:  Cervical exam deferred        Extremities: Normal range of motion.     Mental Status: Normal mood and affect. Normal behavior. Normal judgment and thought content.     Assessment   22 y.o. G2P0010 at [redacted]w[redacted]d, EDD 11/17/2019 by Ultrasound presenting for a routine prenatal visit.  Plan   pregnancy2 Problems (from 02/03/19 to present)    Problem Noted Resolved   High-risk pregnancy, first trimester 03/14/2019 by Nadara Mustard, MD No    Overview Addendum 03/28/2019  3:39 PM by Nadara Mustard, MD    [facility]inic [facility] Prenatal Labs  Dating [place] 6 weeks Blood type: --/--/O POS Performed at [facility], [address] [place], [place] [address]  [id number])   Genetic Screen  NIPS: Antibody:   Anatomic [place]  Rubella:   Varicella: @VZVIGG @  GTT Early:               Third trimester:  RPR:     Rhogam O POS HBsAg:     TDaP vaccine                  Flu Shot:2019 done; needs fall 2020 HIV:     Baby Food    Breast                            GBS:   Contraception  Pap:  Tob  Encouraged cessation ecigs   CS/VBAC na   Support Person Husband              Glucola today and MaterniT21.  Please refer to After Visit Summary for other counseling recommendations.   Return in about 4 weeks (around 05/23/2019) for HROB.  Marcelyn Bruins, CNM 04/25/2019

## 2019-04-25 NOTE — Patient Instructions (Addendum)
First Trimester of Pregnancy  The first trimester of pregnancy is from week 1 until the end of week 13 (months 1 through 3). A week after a sperm fertilizes an egg, the egg will implant on the wall of the uterus. This embryo will begin to develop into a baby. Genes from you and your partner will form the baby. The female genes will determine whether the baby will be a boy or a girl. At 6-8 weeks, the eyes and face will be formed, and the heartbeat can be seen on ultrasound. At the end of 12 weeks, all the baby's organs will be formed.  Now that you are pregnant, you will want to do everything you can to have a healthy baby. Two of the most important things are to get good prenatal care and to follow your health care provider's instructions. Prenatal care is all the medical care you receive before the baby's birth. This care will help prevent, find, and treat any problems during the pregnancy and childbirth.  Body changes during your first trimester  Your body goes through many changes during pregnancy. The changes vary from woman to woman.   You may gain or lose a couple of pounds at first.   You may feel sick to your stomach (nauseous) and you may throw up (vomit). If the vomiting is uncontrollable, call your health care provider.   You may tire easily.   You may develop headaches that can be relieved by medicines. All medicines should be approved by your health care provider.   You may urinate more often. Painful urination may mean you have a bladder infection.   You may develop heartburn as a result of your pregnancy.   You may develop constipation because certain hormones are causing the muscles that push stool through your intestines to slow down.   You may develop hemorrhoids or swollen veins (varicose veins).   Your breasts may begin to grow larger and become tender. Your nipples may stick out more, and the tissue that surrounds them (areola) may become darker.   Your gums may bleed and may be  sensitive to brushing and flossing.   Dark spots or blotches (chloasma, mask of pregnancy) may develop on your face. This will likely fade after the baby is born.   Your menstrual periods will stop.   You may have a loss of appetite.   You may develop cravings for certain kinds of food.   You may have changes in your emotions from day to day, such as being excited to be pregnant or being concerned that something may go wrong with the pregnancy and baby.   You may have more vivid and strange dreams.   You may have changes in your hair. These can include thickening of your hair, rapid growth, and changes in texture. Some women also have hair loss during or after pregnancy, or hair that feels dry or thin. Your hair will most likely return to normal after your baby is born.  What to expect at prenatal visits  During a routine prenatal visit:   You will be weighed to make sure you and the baby are growing normally.   Your blood pressure will be taken.   Your abdomen will be measured to track your baby's growth.   The fetal heartbeat will be listened to between weeks 10 and 14 of your pregnancy.   Test results from any previous visits will be discussed.  Your health care provider may ask you:     How you are feeling.   If you are feeling the baby move.   If you have had any abnormal symptoms, such as leaking fluid, bleeding, severe headaches, or abdominal cramping.   If you are using any tobacco products, including cigarettes, chewing tobacco, and electronic cigarettes.   If you have any questions.  Other tests that may be performed during your first trimester include:   Blood tests to find your blood type and to check for the presence of any previous infections. The tests will also be used to check for low iron levels (anemia) and protein on red blood cells (Rh antibodies). Depending on your risk factors, or if you previously had diabetes during pregnancy, you may have tests to check for high blood sugar  that affects pregnant women (gestational diabetes).   Urine tests to check for infections, diabetes, or protein in the urine.   An ultrasound to confirm the proper growth and development of the baby.   Fetal screens for spinal cord problems (spina bifida) and Down syndrome.   HIV (human immunodeficiency virus) testing. Routine prenatal testing includes screening for HIV, unless you choose not to have this test.   You may need other tests to make sure you and the baby are doing well.  Follow these instructions at home:  Medicines   Follow your health care provider's instructions regarding medicine use. Specific medicines may be either safe or unsafe to take during pregnancy.   Take a prenatal vitamin that contains at least 600 micrograms (mcg) of folic acid.   If you develop constipation, try taking a stool softener if your health care provider approves.  Eating and drinking     Eat a balanced diet that includes fresh fruits and vegetables, whole grains, good sources of protein such as meat, eggs, or tofu, and low-fat dairy. Your health care provider will help you determine the amount of weight gain that is right for you.   Avoid raw meat and uncooked cheese. These carry germs that can cause birth defects in the baby.   Eating four or five small meals rather than three large meals a day may help relieve nausea and vomiting. If you start to feel nauseous, eating a few soda crackers can be helpful. Drinking liquids between meals, instead of during meals, also seems to help ease nausea and vomiting.   Limit foods that are high in fat and processed sugars, such as fried and sweet foods.   To prevent constipation:  ? Eat foods that are high in fiber, such as fresh fruits and vegetables, whole grains, and beans.  ? Drink enough fluid to keep your urine clear or pale yellow.  Activity   Exercise only as directed by your health care provider. Most women can continue their usual exercise routine during  pregnancy. Try to exercise for 30 minutes at least 5 days a week. Exercising will help you:  ? Control your weight.  ? Stay in shape.  ? Be prepared for labor and delivery.   Experiencing pain or cramping in the lower abdomen or lower back is a good sign that you should stop exercising. Check with your health care provider before continuing with normal exercises.   Try to avoid standing for long periods of time. Move your legs often if you must stand in one place for a long time.   Avoid heavy lifting.   Wear low-heeled shoes and practice good posture.   You may continue to have sex unless your health care   provider tells you not to.  Relieving pain and discomfort   Wear a good support bra to relieve breast tenderness.   Take warm sitz baths to soothe any pain or discomfort caused by hemorrhoids. Use hemorrhoid cream if your health care provider approves.   Rest with your legs elevated if you have leg cramps or low back pain.   If you develop varicose veins in your legs, wear support hose. Elevate your feet for 15 minutes, 3-4 times a day. Limit salt in your diet.  Prenatal care   Schedule your prenatal visits by the twelfth week of pregnancy. They are usually scheduled monthly at first, then more often in the last 2 months before delivery.   Write down your questions. Take them to your prenatal visits.   Keep all your prenatal visits as told by your health care provider. This is important.  Safety   Wear your seat belt at all times when driving.   Make a list of emergency phone numbers, including numbers for family, friends, the hospital, and police and fire departments.  General instructions   Ask your health care provider for a referral to a local prenatal education class. Begin classes no later than the beginning of month 6 of your pregnancy.   Ask for help if you have counseling or nutritional needs during pregnancy. Your health care provider can offer advice or refer you to specialists for help  with various needs.   Do not use hot tubs, steam rooms, or saunas.   Do not douche or use tampons or scented sanitary pads.   Do not cross your legs for long periods of time.   Avoid cat litter boxes and soil used by cats. These carry germs that can cause birth defects in the baby and possibly loss of the fetus by miscarriage or stillbirth.   Avoid all smoking, herbs, alcohol, and medicines not prescribed by your health care provider. Chemicals in these products affect the formation and growth of the baby.   Do not use any products that contain nicotine or tobacco, such as cigarettes and e-cigarettes. If you need help quitting, ask your health care provider. You may receive counseling support and other resources to help you quit.   Schedule a dentist appointment. At home, brush your teeth with a soft toothbrush and be gentle when you floss.  Contact a health care provider if:   You have dizziness.   You have mild pelvic cramps, pelvic pressure, or nagging pain in the abdominal area.   You have persistent nausea, vomiting, or diarrhea.   You have a bad smelling vaginal discharge.   You have pain when you urinate.   You notice increased swelling in your face, hands, legs, or ankles.   You are exposed to fifth disease or chickenpox.   You are exposed to German measles (rubella) and have never had it.  Get help right away if:   You have a fever.   You are leaking fluid from your vagina.   You have spotting or bleeding from your vagina.   You have severe abdominal cramping or pain.   You have rapid weight gain or loss.   You vomit blood or material that looks like coffee grounds.   You develop a severe headache.   You have shortness of breath.   You have any kind of trauma, such as from a fall or a car accident.  Summary   The first trimester of pregnancy is from week 1 until   the end of week 13 (months 1 through 3).   Your body goes through many changes during pregnancy. The changes vary from  woman to woman.   You will have routine prenatal visits. During those visits, your health care provider will examine you, discuss any test results you may have, and talk with you about how you are feeling.  This information is not intended to replace advice given to you by your health care provider. Make sure you discuss any questions you have with your health care provider.  Document Released: 11/09/2001 Document Revised: 10/27/2016 Document Reviewed: 10/27/2016  Elsevier Interactive Patient Education  2019 [employer]

## 2019-04-25 NOTE — Progress Notes (Signed)
ROB/1 hr GTT- no concerns 

## 2019-04-26 LAB — GLUCOSE, 1 HOUR GESTATIONAL: Gestational Diabetes Screen: 91 mg/dL (ref 65–139)

## 2019-05-01 ENCOUNTER — Telehealth: Payer: Self-pay

## 2019-05-01 NOTE — Telephone Encounter (Signed)
Pt calling for genetic test results form last week.  [phone]  Adv pt results are not back yet; it usually takes about two weeks; to ck back toward the end of next week.

## 2019-05-03 LAB — MATERNIT 21 PLUS CORE, BLOOD
Fetal Fraction: 5
Result (T21): NEGATIVE
Trisomy 13 (Patau syndrome): NEGATIVE
Trisomy 18 (Edwards syndrome): NEGATIVE
Trisomy 21 (Down syndrome): NEGATIVE

## 2019-05-12 ENCOUNTER — Encounter: Payer: Self-pay | Admitting: Obstetrics & Gynecology

## 2019-05-14 ENCOUNTER — Other Ambulatory Visit: Payer: Self-pay | Admitting: Obstetrics & Gynecology

## 2019-05-15 NOTE — Telephone Encounter (Signed)
Please advise 

## 2019-05-23 ENCOUNTER — Other Ambulatory Visit: Payer: Self-pay

## 2019-05-23 ENCOUNTER — Encounter: Payer: Self-pay | Admitting: Obstetrics and Gynecology

## 2019-05-23 ENCOUNTER — Ambulatory Visit (INDEPENDENT_AMBULATORY_CARE_PROVIDER_SITE_OTHER): Payer: BC Managed Care – PPO | Admitting: Obstetrics and Gynecology

## 2019-05-23 VITALS — BP 112/68 | Wt 238.0 lb

## 2019-05-23 DIAGNOSIS — Z3A14 14 weeks gestation of pregnancy: Secondary | ICD-10-CM

## 2019-05-23 DIAGNOSIS — O0991 Supervision of high risk pregnancy, unspecified, first trimester: Secondary | ICD-10-CM

## 2019-05-23 DIAGNOSIS — O99212 Obesity complicating pregnancy, second trimester: Secondary | ICD-10-CM

## 2019-05-23 DIAGNOSIS — O10919 Unspecified pre-existing hypertension complicating pregnancy, unspecified trimester: Secondary | ICD-10-CM

## 2019-05-23 DIAGNOSIS — O10912 Unspecified pre-existing hypertension complicating pregnancy, second trimester: Secondary | ICD-10-CM

## 2019-05-23 DIAGNOSIS — O99342 Other mental disorders complicating pregnancy, second trimester: Secondary | ICD-10-CM

## 2019-05-23 DIAGNOSIS — O0992 Supervision of high risk pregnancy, unspecified, second trimester: Secondary | ICD-10-CM

## 2019-05-23 DIAGNOSIS — F319 Bipolar disorder, unspecified: Secondary | ICD-10-CM

## 2019-05-23 NOTE — Progress Notes (Signed)
HROB C/o shooting pain in pelvic area, fatigue

## 2019-05-23 NOTE — Progress Notes (Signed)
    Routine Prenatal Care Visit  Subjective  Frances Lamb is a 23 y.o. G2P0010 at [redacted]w[redacted]d being seen today for ongoing prenatal care.  She is currently monitored for the following issues for this high-risk pregnancy and has Chronic hypertension affecting pregnancy; High-risk pregnancy, first trimester; Bipolar disease during pregnancy in first trimester ([place]); and Obesity, Class III, BMI 40-49.9 (morbid obesity) ([place]) on their problem list.  ----------------------------------------------------------------------------------- Patient reports no complaints.   Contractions: Not present. Vag. Bleeding: None.  Movement: Absent. Denies leaking of fluid.  ----------------------------------------------------------------------------------- The following portions of the patient's history were reviewed and updated as appropriate: allergies, current medications, past family history, past medical history, past social history, past surgical history and problem list. Problem list updated.   Objective  Blood pressure 112/68, weight 238 lb (108 kg), last menstrual period 02/03/2019. Pregravid weight 230 lb (104.3 kg) Total Weight Gain 8 lb (3.629 kg) Urinalysis:      Fetal Status: Fetal Heart Rate (bpm): 150   Movement: Absent     General:  Alert, oriented and cooperative. Patient is in no acute distress.  Skin: Skin is warm and dry. No rash noted.   Cardiovascular: Normal heart rate noted  Respiratory: Normal respiratory effort, no problems with respiration noted  Abdomen: Soft, gravid, appropriate for gestational age. Pain/Pressure: Absent     Pelvic:  Cervical exam deferred        Extremities: Normal range of motion.  Edema: None  Mental Status: Normal mood and affect. Normal behavior. Normal judgment and thought content.     Assessment   23 y.o. G2P0010 at [redacted]w[redacted]d by  11/17/2019, by Ultrasound presenting for routine prenatal visit  Plan   pregnancy2 Problems (from 02/03/19 to present)    Problem  Noted Resolved   High-risk pregnancy, first trimester 03/14/2019 by Gae Dry, MD No   Overview Addendum 05/23/2019  3:59 PM by Homero Fellers, MD    [facility]inic [facility] Prenatal Labs  Dating [place] 6 weeks Blood type: O/Positive/-- (04/16 1418)   Genetic Screen  NIPS: normal XY Antibody:Negative (04/16 1418)  Anatomic [place]  Rubella: 1.10 (04/16 1418) Varicella: NONIMMUNE  GTT Early:  91              Third trimester:  RPR: Non Reactive (04/16 1418)   Rhogam O POS HBsAg: Negative (04/16 1418)   TDaP vaccine                  Flu Shot:2019 done; needs fall 2020 HIV: Non Reactive (04/16 1418)   Baby Food    Breast                            GBS:   Contraception  Pap: 2020 NIL  Tob  Encouraged cessation ecigs   CS/VBAC na   Support Person Husband               Gestational age appropriate obstetric precautions including but not limited to vaginal bleeding, contractions, leaking of fluid and fetal movement were reviewed in detail with the patient.    Return in about 5 weeks (around 06/27/2019) for [place] and [place].  Homero Fellers MD [facility], [facility] 05/23/2019, 3:59 PM

## 2019-06-24 ENCOUNTER — Emergency Department
Admission: EM | Admit: 2019-06-24 | Discharge: 2019-06-24 | Disposition: A | Discharge status: Home / Self Care | Payer: Managed Care, Other (non HMO) | Attending: Emergency Medicine | Admitting: Emergency Medicine

## 2019-06-24 ENCOUNTER — Emergency Department: Payer: Managed Care, Other (non HMO)

## 2019-06-24 ENCOUNTER — Other Ambulatory Visit: Payer: Self-pay

## 2019-06-24 DIAGNOSIS — Z3A19 19 weeks gestation of pregnancy: Secondary | ICD-10-CM | POA: Diagnosis not present

## 2019-06-24 DIAGNOSIS — K529 Noninfective gastroenteritis and colitis, unspecified: Secondary | ICD-10-CM | POA: Diagnosis not present

## 2019-06-24 DIAGNOSIS — R109 Unspecified abdominal pain: Secondary | ICD-10-CM | POA: Diagnosis not present

## 2019-06-24 DIAGNOSIS — Z79899 Other long term (current) drug therapy: Secondary | ICD-10-CM | POA: Diagnosis not present

## 2019-06-24 DIAGNOSIS — F17228 Nicotine dependence, chewing tobacco, with other nicotine-induced disorders: Secondary | ICD-10-CM | POA: Diagnosis not present

## 2019-06-24 DIAGNOSIS — O26892 Other specified pregnancy related conditions, second trimester: Secondary | ICD-10-CM | POA: Diagnosis present

## 2019-06-24 DIAGNOSIS — R110 Nausea: Secondary | ICD-10-CM | POA: Insufficient documentation

## 2019-06-24 LAB — COMPREHENSIVE METABOLIC PANEL
ALT: 15 U/L (ref 0–44)
AST: 16 U/L (ref 15–41)
Albumin: 3.3 g/dL — ABNORMAL LOW (ref 3.5–5.0)
Alkaline Phosphatase: 83 U/L (ref 38–126)
Anion gap: 8 (ref 5–15)
BUN: 6 mg/dL (ref 6–20)
CO2: 20 mmol/L — ABNORMAL LOW (ref 22–32)
Calcium: 8.9 mg/dL (ref 8.9–10.3)
Chloride: 109 mmol/L (ref 98–111)
Creatinine, Ser: 0.48 mg/dL (ref 0.44–1.00)
GFR calc Af Amer: >60 mL/min (ref >60)
GFR calc non Af Amer: >60 mL/min (ref >60)
Glucose, Bld: 133 mg/dL — ABNORMAL HIGH (ref 70–99)
Potassium: 3.9 mmol/L (ref 3.5–5.1)
Sodium: 137 mmol/L (ref 135–145)
Total Bilirubin: 0.2 mg/dL — ABNORMAL LOW (ref 0.3–1.2)
Total Protein: 6.7 g/dL (ref 6.5–8.1)

## 2019-06-24 LAB — CBC
HCT: 36.3 % (ref 36.0–46.0)
Hemoglobin: 12.4 g/dL (ref 12.0–15.0)
MCH: 28.6 pg (ref 26.0–34.0)
MCHC: 34.2 g/dL (ref 30.0–36.0)
MCV: 83.6 fL (ref 80.0–100.0)
Platelets: 275 10*3/uL (ref 150–400)
RBC: 4.34 MIL/uL (ref 3.87–5.11)
RDW: 13.3 % (ref 11.5–15.5)
WBC: 13.1 10*3/uL — ABNORMAL HIGH (ref 4.0–10.5)
nRBC: 0 % (ref 0.0–0.2)

## 2019-06-24 LAB — HCG, QUANTITATIVE, PREGNANCY: hCG, Beta Chain, Quant, S: 21487 m[IU]/mL — ABNORMAL HIGH (ref <5)

## 2019-06-24 MED ORDER — ONDANSETRON 4 MG PO TBDP
4.0000 mg | ORAL_TABLET | Freq: Once | ORAL | Status: AC
  Administered 2019-06-24: 4 mg via ORAL
  Filled 2019-06-24: qty 1

## 2019-06-24 MED ORDER — PROMETHAZINE HCL 12.5 MG PO TABS: 12.5000 mg | ORAL_TABLET | Freq: Three times a day (TID) | ORAL | 0 refills | Status: DC | PRN

## 2019-06-24 NOTE — ED Triage Notes (Signed)
Pt states vaginal pain, pain shooting to "my anus", abdominal pain, vomiting and diarrhea today. Pt is [redacted] weeks pregnant. Pt denies vaginal bleeding. Pt appears in no acute distress. Pt denies fever.

## 2019-06-24 NOTE — ED Provider Notes (Signed)
[facility] Emergency Department Provider Note   ____________________________________________    I have reviewed the triage vital signs and the nursing notes.   HISTORY  Chief Complaint Abdominal Pain and Diarrhea     HPI Frances Lamb is a 23 y.o. female who presents with complaints of nausea, abdominal cramping and diarrhea.  Patient reports symptoms been ongoing for 2 days.  She notes watery brown stool, non-bloody.  Has taken some nausea medication which seems to have helped.  No fevers or chills.  No sick contacts.  No recent travel.  No myalgias cough or shortness of breath.  [facility]  Past Medical History:  Diagnosis Date  . Migraine   . Miscarriage     Patient Active Problem List   Diagnosis Date Noted  . Chronic hypertension affecting pregnancy 03/14/2019  . High-risk pregnancy, first trimester 03/14/2019  . Bipolar disease during pregnancy in first trimester ([facility]) 03/14/2019  . Obesity, Class III, BMI 40-49.9 (morbid obesity) ([facility]) 03/14/2019    No past surgical history on file.  Prior to Admission medications   Medication Sig Start Date End Date Taking? Authorizing Provider  DULoxetine (CYMBALTA) 60 MG capsule Take 60 mg by mouth daily.    [provider]  hydrOXYzine (ATARAX/VISTARIL) 50 MG tablet TK 1 TO 2 TS PO HS 03/08/19   [provider]  LamoTRIgine 250 MG TB24 24 hour tablet TK 1 T PO QD 05/16/18   [provider]  LATUDA 20 MG TABS tablet  05/04/19   [provider]  metoprolol succinate (TOPROL-XL) 25 MG 24 hr tablet Take by mouth. 01/17/18 01/17/19  [provider]  ondansetron (ZOFRAN ODT) 4 MG disintegrating tablet Take 1 tablet (4 mg total) by mouth every 6 (six) hours as needed for nausea. 03/27/19   Nadara MustardHarris, Demitrious Mccannon P, MD  Prenatal Vit-Fe Fumarate-FA (PRENATAL COMPLETE PO) Take 1 tablet by mouth daily.    [provider]  promethazine (PHENERGAN) 12.5 MG  tablet Take 1 tablet (12.5 mg total) by mouth every 8 (eight) hours as needed for nausea or vomiting. 06/24/19   Jene EveryKinner, Jahquez Steffler, MD  QUEtiapine Fumarate (SEROQUEL XR) 150 MG 24 hr tablet Take by mouth. 06/16/18   [provider]     Allergies Patient has no known allergies.  No family history on file.  Social History Social History   Tobacco Use  . Smoking status: Former Games developermoker  . Smokeless tobacco: Current User  Substance Use Topics  . Alcohol use: Yes  . Drug use: Never    Review of Systems  Constitutional: No fever/chills Eyes: No visual changes.  ENT: No sore throat. Cardiovascular: Denies chest pain. Respiratory: Denies shortness of breath. Gastrointestinal: As above Genitourinary: Intermittent vaginal pain which is been ongoing for greater than a month which she has discussed with OB Musculoskeletal: Negative for back pain. Skin: Negative for rash. Neurological: Negative for headaches    ____________________________________________   PHYSICAL EXAM:  VITAL SIGNS: ED Triage Vitals  Enc Vitals Group     BP 06/24/19 1855 123/83     Pulse Rate 06/24/19 1855 (!) 111     Resp 06/24/19 1855 18     Temp 06/24/19 1855 98.5 F (36.9 C)     Temp Source 06/24/19 1855 Oral     SpO2 06/24/19 1855 97 %     Weight 06/24/19 1855 108.9 kg (240 lb)     Height 06/24/19 1855 1.575 m (5\' 2" )  Head Circumference --      Peak Flow --      Pain Score 06/24/19 1902 6     Pain Loc --      Pain Edu? --      Excl. in [place]? --     Constitutional: Alert and oriented. Eyes: Conjunctivae are normal.   Nose: No congestion/rhinnorhea. Mouth/Throat: Mucous membranes are moist.    Cardiovascular: Normal rate, regular rhythm.  Good peripheral circulation. Respiratory: Normal respiratory effort.  No retractions. Lungs CTAB. Gastrointestinal: Soft and nontender. No distention.    Musculoskeletal: .  Warm and well perfused Neurologic:  Normal speech and language. No gross  focal neurologic deficits are appreciated.  Skin:  Skin is warm, dry and intact. No rash noted. Psychiatric: Mood and affect are normal. Speech and behavior are normal.  ____________________________________________   LABS (all labs ordered are listed, but only abnormal results are displayed)  Labs Reviewed  COMPREHENSIVE METABOLIC PANEL - Abnormal; Notable for the following components:      Result Value   CO2 20 (*)    Glucose, Bld 133 (*)    Albumin 3.3 (*)    Total Bilirubin 0.2 (*)    All other components within normal limits  CBC - Abnormal; Notable for the following components:   WBC 13.1 (*)    All other components within normal limits  HCG, QUANTITATIVE, PREGNANCY - Abnormal; Notable for the following components:   hCG, Beta Chain, Quant, S 21,487 (*)    All other components within normal limits  URINALYSIS, COMPLETE (UACMP) WITH MICROSCOPIC   ____________________________________________  EKG  None ____________________________________________  RADIOLOGY  Ultrasound unremarkable ____________________________________________   PROCEDURES  Procedure(s) performed: No  Procedures   Critical Care performed: No ____________________________________________   INITIAL IMPRESSION / ASSESSMENT AND PLAN / ED COURSE  Pertinent labs & imaging results that were available during my care of the patient were reviewed by me and considered in my medical decision making (see chart for details).  Patient presents with nausea vomiting diarrhea and abdominal cramping which seems to have improved while in the emergency department.  Lab work significant for mildly elevated white blood cell count, otherwise lab work unremarkable.  Ultrasound reassuring.  Suspect viral gastroenteritis, recommend symptomatic treatment We will treat with p.o. nausea medication, close follow-up with OB   ____________________________________________   FINAL CLINICAL IMPRESSION(S) / ED DIAGNOSES   Final diagnoses:  Gastroenteritis        Note:  This document was prepared using Dragon voice recognition software and may include unintentional dictation errors.   [name], MD 06/24/19 2159

## 2019-06-27 ENCOUNTER — Encounter: Payer: Self-pay | Admitting: Advanced Practice Midwife

## 2019-06-27 ENCOUNTER — Ambulatory Visit (INDEPENDENT_AMBULATORY_CARE_PROVIDER_SITE_OTHER): Payer: BC Managed Care – PPO

## 2019-06-27 ENCOUNTER — Other Ambulatory Visit: Payer: Self-pay

## 2019-06-27 ENCOUNTER — Ambulatory Visit (INDEPENDENT_AMBULATORY_CARE_PROVIDER_SITE_OTHER): Payer: BC Managed Care – PPO | Admitting: Advanced Practice Midwife

## 2019-06-27 VITALS — BP 126/88 | Wt 247.0 lb

## 2019-06-27 DIAGNOSIS — O0991 Supervision of high risk pregnancy, unspecified, first trimester: Secondary | ICD-10-CM

## 2019-06-27 DIAGNOSIS — Z363 Encounter for antenatal screening for malformations: Secondary | ICD-10-CM

## 2019-06-27 DIAGNOSIS — Z3A19 19 weeks gestation of pregnancy: Secondary | ICD-10-CM

## 2019-06-27 DIAGNOSIS — O10912 Unspecified pre-existing hypertension complicating pregnancy, second trimester: Secondary | ICD-10-CM

## 2019-06-27 DIAGNOSIS — O0990 Supervision of high risk pregnancy, unspecified, unspecified trimester: Secondary | ICD-10-CM

## 2019-06-27 NOTE — Progress Notes (Signed)
anatomy u/s today. No vb. No lof.

## 2019-06-27 NOTE — Progress Notes (Signed)
  Routine Prenatal Care Visit  Subjective  Frances Lamb is a 23 y.o. G2P0010 at [redacted]w[redacted]d being seen today for ongoing prenatal care.  She is currently monitored for the following issues for this high-risk pregnancy and has Chronic hypertension affecting pregnancy; Supervision of high-risk pregnancy; Bipolar disease during pregnancy in first trimester ([place]); and Obesity, Class III, BMI 40-49.9 (morbid obesity) ([place]) on their problem list.  ----------------------------------------------------------------------------------- Patient reports no complaints.   Contractions: Not present. Vag. Bleeding: None.  Movement: Present. Denies leaking of fluid.  ----------------------------------------------------------------------------------- The following portions of the patient's history were reviewed and updated as appropriate: allergies, current medications, past family history, past medical history, past social history, past surgical history and problem list. Problem list updated.   Objective  Blood pressure 126/88, weight 247 lb (112 kg), last menstrual period 02/03/2019. Pregravid weight 230 lb (104.3 kg) Total Weight Gain 17 lb (7.711 kg) Urinalysis: Urine Protein    Urine Glucose    Fetal Status: Fetal Heart Rate (bpm): 151   Movement: Present     Anatomy scan today is incomplete for 3VV, RVOT, ventricular and atrial septum, ACI, profile, heel of left foot, feet, 3VC, gender  General:  Alert, oriented and cooperative. Patient is in no acute distress.  Skin: Skin is warm and dry. No rash noted.   Cardiovascular: Normal heart rate noted  Respiratory: Normal respiratory effort, no problems with respiration noted  Abdomen: Soft, gravid, appropriate for gestational age. Pain/Pressure: Absent     Pelvic:  Cervical exam deferred        Extremities: Normal range of motion.     Mental Status: Normal mood and affect. Normal behavior. Normal judgment and thought content.   Assessment   23 y.o. G2P0010 at  [redacted]w[redacted]d by  11/17/2019, by Ultrasound presenting for routine prenatal visit  Plan   pregnancy2 Problems (from 02/03/19 to present)    Problem Noted Resolved   Supervision of high-risk pregnancy 03/14/2019 by Gae Dry, MD No   Overview Addendum 05/23/2019  3:59 PM by Homero Fellers, MD    [facility] Prenatal Labs  Dating [place] 6 weeks Blood type: O/Positive/-- (04/16 1418)   Genetic Screen  NIPS: normal XY Antibody:Negative (04/16 1418)  Anatomic [place]  Rubella: 1.10 (04/16 1418) Varicella: NONIMMUNE  GTT Early:  91              Third trimester:  RPR: Non Reactive (04/16 1418)   Rhogam O POS HBsAg: Negative (04/16 1418)   TDaP vaccine                  Flu Shot:2019 done; needs fall 2020 HIV: Non Reactive (04/16 1418)   Baby Food    Breast                            GBS:   Contraception  Pap: 2020 NIL  Tob  Encouraged cessation ecigs   CS/VBAC na   Support Person Husband Larene [place]               Preterm labor symptoms and general obstetric precautions including but not limited to vaginal bleeding, contractions, leaking of fluid and fetal movement were reviewed in detail with the patient. Please refer to After Visit Summary for other counseling recommendations.   Return in about 4 weeks (around 07/25/2019) for follow up anatomy and rob.  Rod Can, CNM 06/27/2019 3:20 PM

## 2019-07-11 ENCOUNTER — Encounter: Payer: Self-pay | Admitting: Obstetrics & Gynecology

## 2019-07-12 ENCOUNTER — Other Ambulatory Visit: Payer: Self-pay | Admitting: Obstetrics & Gynecology

## 2019-07-12 MED ORDER — METOPROLOL SUCCINATE ER 25 MG PO TB24: 25.0000 mg | ORAL_TABLET | Freq: Every day | ORAL | 2 refills | Status: DC

## 2019-07-24 ENCOUNTER — Ambulatory Visit (INDEPENDENT_AMBULATORY_CARE_PROVIDER_SITE_OTHER): Payer: BC Managed Care – PPO | Admitting: Advanced Practice Midwife

## 2019-07-24 ENCOUNTER — Ambulatory Visit (INDEPENDENT_AMBULATORY_CARE_PROVIDER_SITE_OTHER): Payer: BC Managed Care – PPO

## 2019-07-24 ENCOUNTER — Encounter: Payer: Self-pay | Admitting: Advanced Practice Midwife

## 2019-07-24 ENCOUNTER — Other Ambulatory Visit: Payer: Self-pay

## 2019-07-24 VITALS — BP 110/70 | Wt 254.0 lb

## 2019-07-24 DIAGNOSIS — O0990 Supervision of high risk pregnancy, unspecified, unspecified trimester: Secondary | ICD-10-CM

## 2019-07-24 DIAGNOSIS — F418 Other specified anxiety disorders: Secondary | ICD-10-CM

## 2019-07-24 DIAGNOSIS — O99212 Obesity complicating pregnancy, second trimester: Secondary | ICD-10-CM

## 2019-07-24 DIAGNOSIS — O0992 Supervision of high risk pregnancy, unspecified, second trimester: Secondary | ICD-10-CM

## 2019-07-24 DIAGNOSIS — Z131 Encounter for screening for diabetes mellitus: Secondary | ICD-10-CM

## 2019-07-24 DIAGNOSIS — Z3A23 23 weeks gestation of pregnancy: Secondary | ICD-10-CM

## 2019-07-24 DIAGNOSIS — Z362 Encounter for other antenatal screening follow-up: Secondary | ICD-10-CM

## 2019-07-24 DIAGNOSIS — O99342 Other mental disorders complicating pregnancy, second trimester: Secondary | ICD-10-CM

## 2019-07-24 DIAGNOSIS — Z130 Encounter for screening for diseases of the blood and blood-forming organs and certain disorders involving the immune mechanism: Secondary | ICD-10-CM

## 2019-07-24 DIAGNOSIS — Z113 Encounter for screening for infections with a predominantly sexual mode of transmission: Secondary | ICD-10-CM

## 2019-07-24 LAB — POCT URINALYSIS DIPSTICK OB
Glucose, UA: NEGATIVE
POC,PROTEIN,UA: NEGATIVE

## 2019-07-24 NOTE — Progress Notes (Signed)
  Routine Prenatal Care Visit  Subjective  Frances Lamb is a 23 y.o. G2P0010 at [redacted]w[redacted]d being seen today for ongoing prenatal care.  She is currently monitored for the following issues for this high-risk pregnancy and has Chronic hypertension affecting pregnancy; Supervision of high-risk pregnancy; Bipolar disease during pregnancy in first trimester ([place]); Obesity, Class III, BMI 40-49.9 (morbid obesity) ([place]); and Anxiety state on their problem list.  ----------------------------------------------------------------------------------- Patient reports no complaints.  Discussed q 4 week growth scans beginning next visit for BMI >40, and 28 week labs next visit.  Contractions: Not present. Vag. Bleeding: None.  Movement: Present. Leaking Fluid denies.  ----------------------------------------------------------------------------------- The following portions of the patient's history were reviewed and updated as appropriate: allergies, current medications, past family history, past medical history, past social history, past surgical history and problem list. Problem list updated.  Objective  Blood pressure 110/70, weight 254 lb (115.2 kg), last menstrual period 02/03/2019. Pregravid weight 230 lb (104.3 kg) Total Weight Gain 24 lb (10.9 kg) Urinalysis: Urine Protein Negative  Urine Glucose Negative  Fetal Status: Fetal Heart Rate (bpm): 147 Fundal Height: 24 cm Movement: Present  Presentation: breech Follow up anatomy: anatomy scan is now complete, AFI wnl  General:  Alert, oriented and cooperative. Patient is in no acute distress.  Skin: Skin is warm and dry. No rash noted.   Cardiovascular: Normal heart rate noted  Respiratory: Normal respiratory effort, no problems with respiration noted  Abdomen: Soft, gravid, appropriate for gestational age. Pain/Pressure: Present     Pelvic:  Cervical exam deferred        Extremities: Normal range of motion.     Mental Status: Normal mood and affect. Normal  behavior. Normal judgment and thought content.   Assessment   23 y.o. G2P0010 at [redacted]w[redacted]d by  11/17/2019, by Ultrasound presenting for routine prenatal visit  Plan   pregnancy2 Problems (from 02/03/19 to present)    Problem Noted Resolved   Supervision of high-risk pregnancy 03/14/2019 by Gae Dry, MD No   Overview Addendum 05/23/2019  3:59 PM by Homero Fellers, MD    Clinic [facility] Prenatal Labs  Dating [place] 6 weeks Blood type: O/Positive/-- (04/16 1418)   Genetic Screen  NIPS: normal XY Antibody:Negative (04/16 1418)  Anatomic [place]  Rubella: 1.10 (04/16 1418) Varicella: NONIMMUNE  GTT Early:  91              Third trimester:  RPR: Non Reactive (04/16 1418)   Rhogam O POS HBsAg: Negative (04/16 1418)   TDaP vaccine                  Flu Shot:2019 done; needs fall 2020 HIV: Non Reactive (04/16 1418)   Baby Food    Breast                            GBS:   Contraception  Pap: 2020 NIL  Tob  Encouraged cessation e-cigs   CS/VBAC na   Support Person Husband               Preterm labor symptoms and general obstetric precautions including but not limited to vaginal bleeding, contractions, leaking of fluid and fetal movement were reviewed in detail with the patient.    Return in about 4 weeks (around 08/21/2019) for growth scan/28 wk labs and rob.  Rod Can, CNM 07/24/2019 2:49 PM

## 2019-07-24 NOTE — Progress Notes (Signed)
ROB and Follow up anatomy- no concerns

## 2019-08-20 ENCOUNTER — Telehealth: Payer: Self-pay | Admitting: Advanced Practice Midwife

## 2019-08-20 NOTE — Telephone Encounter (Signed)
Patient is calling with symptoms fever, diarrhea, cough, and test negative for Covid 19. Patient states her fever is at the High 99.4. Patient is schedule for appointment tomorrow.Please advise

## 2019-08-20 NOTE — Telephone Encounter (Signed)
Pt needs to reschedule per Crystal and ABC to next week to make sure pt is symptom free for 72 hours atleast. Pt states that is fine with her and she can do an appt on Monday or Tuesday of next week

## 2019-08-21 ENCOUNTER — Encounter: Payer: BC Managed Care – PPO | Admitting: Advanced Practice Midwife

## 2019-08-21 ENCOUNTER — Other Ambulatory Visit: Payer: BC Managed Care – PPO

## 2019-08-27 ENCOUNTER — Ambulatory Visit (INDEPENDENT_AMBULATORY_CARE_PROVIDER_SITE_OTHER): Payer: BC Managed Care – PPO | Admitting: Obstetrics and Gynecology

## 2019-08-27 ENCOUNTER — Encounter: Payer: Self-pay | Admitting: Obstetrics and Gynecology

## 2019-08-27 ENCOUNTER — Ambulatory Visit (INDEPENDENT_AMBULATORY_CARE_PROVIDER_SITE_OTHER): Payer: BC Managed Care – PPO

## 2019-08-27 ENCOUNTER — Other Ambulatory Visit: Payer: BC Managed Care – PPO

## 2019-08-27 ENCOUNTER — Other Ambulatory Visit: Payer: Self-pay

## 2019-08-27 VITALS — BP 118/74 | Wt 262.0 lb

## 2019-08-27 DIAGNOSIS — O0990 Supervision of high risk pregnancy, unspecified, unspecified trimester: Secondary | ICD-10-CM

## 2019-08-27 DIAGNOSIS — Z362 Encounter for other antenatal screening follow-up: Secondary | ICD-10-CM

## 2019-08-27 DIAGNOSIS — Z0289 Encounter for other administrative examinations: Secondary | ICD-10-CM

## 2019-08-27 DIAGNOSIS — F319 Bipolar disorder, unspecified: Secondary | ICD-10-CM

## 2019-08-27 DIAGNOSIS — O0993 Supervision of high risk pregnancy, unspecified, third trimester: Secondary | ICD-10-CM

## 2019-08-27 DIAGNOSIS — O10913 Unspecified pre-existing hypertension complicating pregnancy, third trimester: Secondary | ICD-10-CM

## 2019-08-27 DIAGNOSIS — Z131 Encounter for screening for diabetes mellitus: Secondary | ICD-10-CM

## 2019-08-27 DIAGNOSIS — Z3A28 28 weeks gestation of pregnancy: Secondary | ICD-10-CM

## 2019-08-27 DIAGNOSIS — Z130 Encounter for screening for diseases of the blood and blood-forming organs and certain disorders involving the immune mechanism: Secondary | ICD-10-CM

## 2019-08-27 DIAGNOSIS — O10919 Unspecified pre-existing hypertension complicating pregnancy, unspecified trimester: Secondary | ICD-10-CM

## 2019-08-27 DIAGNOSIS — Z113 Encounter for screening for infections with a predominantly sexual mode of transmission: Secondary | ICD-10-CM

## 2019-08-27 DIAGNOSIS — O99213 Obesity complicating pregnancy, third trimester: Secondary | ICD-10-CM

## 2019-08-27 DIAGNOSIS — O99343 Other mental disorders complicating pregnancy, third trimester: Secondary | ICD-10-CM

## 2019-08-27 NOTE — Progress Notes (Signed)
Routine Prenatal Care Visit  Subjective  Frances Lamb is a 23 y.o. G2P0010 at [redacted]w[redacted]d being seen today for ongoing prenatal care.  She is currently monitored for the following issues for this high-risk pregnancy and has Chronic hypertension affecting pregnancy; Supervision of high-risk pregnancy; Bipolar disease during pregnancy in first trimester ([place]); Obesity, Class III, BMI 40-49.9 (morbid obesity) ([place]); and Anxiety state on their problem list.  ----------------------------------------------------------------------------------- Patient reports no complaints.   Contractions: Not present. Vag. Bleeding: None.  Movement: Present. Leaking Fluid denies.  U/S shows normal growth. Patient is taking bASA ----------------------------------------------------------------------------------- The following portions of the patient's history were reviewed and updated as appropriate: allergies, current medications, past family history, past medical history, past social history, past surgical history and problem list. Problem list updated.  Objective  Blood pressure 118/74, weight 262 lb (118.8 kg), last menstrual period 02/03/2019. Pregravid weight 230 lb (104.3 kg) Total Weight Gain 32 lb (14.5 kg) Urinalysis: Urine Protein    Urine Glucose    Fetal Status: Fetal Heart Rate (bpm): 150   Movement: Present     General:  Alert, oriented and cooperative. Patient is in no acute distress.  Skin: Skin is warm and Lamb. No rash noted.   Cardiovascular: Normal heart rate noted  Respiratory: Normal respiratory effort, no problems with respiration noted  Abdomen: Soft, gravid, appropriate for gestational age. Pain/Pressure: Present     Pelvic:  Cervical exam deferred        Extremities: Normal range of motion.  Edema: None  Mental Status: Normal mood and affect. Normal behavior. Normal judgment and thought content.   Imaging Results US Ob Follow Up  Result Date: 08/27/2019 Patient Name: Frances Lamb DOB:  1996/08/15 MRN: [id number] ULTRASOUND REPORT Location: [facility] Date of Service: 08/27/2019 Indications:Growth Findings: Frances [place] intrauterine pregnancy is visualized with FHR at 156 BPM. Biometrics give an (U/S) Gestational age of [redacted]w[redacted]d and an (U/S) EDD of 11/17/2019; this correlates with the clinically established Estimated Date of Delivery: 11/17/2019. Fetal presentation is Cephalic. Placenta: anterior, posterior. Grade: 1 Growth percentile is 43.4%. EFW: 1,131 g (2 lb 8 oz) Impression: 1. [redacted]w[redacted]d Viable Singleton Intrauterine pregnancy previously established criteria. 2. Growth is 43.4 %ile.  Frances Lamb, RT The ultrasound images and findings were reviewed by me and I agree with the above report. Frances Docker, MD, [facility], [facility] 08/27/2019 11:39 AM       Assessment   23 y.o. G2P0010 at [redacted]w[redacted]d by  11/17/2019, by Ultrasound presenting for routine prenatal visit  Plan   pregnancy2 Problems (from 02/03/19 to present)    Problem Noted Resolved   Chronic hypertension affecting pregnancy 03/14/2019 by Frances Dry, MD No   Overview Signed 08/27/2019 11:41 AM by Frances Bonnet, MD    [ ]  Aspirin 81 mg daily after 12 weeks; discontinue after 36 weeks [x]  baseline labs with CBC, CMP, urine protein/creatinine ratio [n/a ] no BP meds unless BPs become elevated [ ]  ultrasound for growth at 28[x] , 32[ ] , 36 weeks [ ]  [ ]  Aspirin 81 mg daily after 12 weeks; discontinue after 36 weeks  Current antihypertensives:  metoprolol 25 mg Qdaily   Baseline and surveillance labs (pulled in from [facility], refresh links as needed)  Lab Results  Component Value Date   PLT 275 06/24/2019   CREATININE 0.48 06/24/2019   AST 16 06/24/2019   ALT 15 06/24/2019    Antenatal Testing [facility] - O10.919  Group I  BP <  140/90, no preeclampsia, AGA,  nml AFV, +/- meds    Group II BP > 140/90, on meds, no preeclampsia, AGA, nml AFV  20-28-34-38  20-24-28-32-35-38  32//2 x  wk  28//BPP wkly then 32//2 x wk  40 no meds; 39 meds  PRN or 37  Pre-eclampsia  GHTN - O13.9/Preeclampsia without severe features  - O14.00   Preeclampsia with severe features - O14.10  Q 3-4wks  Q 2 wks  28//BPP wkly then 32//2 x wk  Inpatient  37  PRN or 34         Supervision of high-risk pregnancy 03/14/2019 by Frances Mustard, MD No   Overview Addendum 05/23/2019  3:59 PM by Frances Milch, MD    [facility]inic [facility] Prenatal Labs  Dating [place] 6 weeks Blood type: O/Positive/-- (04/16 1418)   Genetic Screen  NIPS: normal XY Antibody:Negative (04/16 1418)  Anatomic [place]  Rubella: 1.10 (04/16 1418) Varicella: NONIMMUNE  GTT Early:  91              Third trimester:  RPR: Non Reactive (04/16 1418)   Rhogam O POS HBsAg: Negative (04/16 1418)   TDaP vaccine                  Flu Shot:2019 done; needs fall 2020 HIV: Non Reactive (04/16 1418)   Baby Food    Breast                            GBS:   Contraception  Pap: 2020 NIL  Tob  Encouraged cessation ecigs   CS/VBAC na   Support Person Husband           Bipolar disease during pregnancy in first trimester ([facility]) 03/14/2019 by Frances Mustard, MD No   Obesity, Class III, BMI 40-49.9 (morbid obesity) ([facility]) 03/14/2019 by Frances Mustard, MD No   Overview Signed 08/27/2019 11:41 AM by Frances Novak, MD    BMI >=40 [ ]  early 1h gtt -  [ ]  u/s for dating [ ]   [ ]  nutritional goals [ ]  folic acid 1mg  [ ]  bASA (>12 weeks) [ ]  consider nutrition consult [ ]  consider maternal EKG 1st trimester [ ]  Growth u/s 28 [ ] , 32 [ ] , 36 weeks [ ]  [ ]  NST/AFI weekly 36+ weeks (36[] , 37[] , 38[] , 39[] , 40[] ) [ ]  IOL by 41 weeks (scheduled, prn [] )           Preterm labor symptoms and general obstetric precautions including but not limited to vaginal bleeding, contractions, leaking of fluid and fetal movement were reviewed in detail with the patient. Please refer to After Visit Summary for other counseling recommendations.    Return in about 2 weeks (around 09/10/2019) for Routine Prenatal Appointment.  , MD, OB/GYN, [facility] 08/27/2019 12:05 PM

## 2019-08-28 LAB — 28 WEEK RH+PANEL
Basophils Absolute: 0 10*3/uL (ref 0.0–0.2)
Basos: 0 %
EOS (ABSOLUTE): 0.1 10*3/uL (ref 0.0–0.4)
Eos: 1 %
Gestational Diabetes Screen: 208 mg/dL — ABNORMAL HIGH (ref 65–139)
HIV Screen 4th Generation wRfx: NONREACTIVE
Hematocrit: 34 % (ref 34.0–46.6)
Hemoglobin: 11.1 g/dL (ref 11.1–15.9)
Immature Grans (Abs): 0.2 10*3/uL — ABNORMAL HIGH (ref 0.0–0.1)
Immature Granulocytes: 1 %
Lymphocytes Absolute: 2.6 10*3/uL (ref 0.7–3.1)
Lymphs: 19 %
MCH: 28.1 pg (ref 26.6–33.0)
MCHC: 32.6 g/dL (ref 31.5–35.7)
MCV: 86 fL (ref 79–97)
Monocytes Absolute: 0.6 10*3/uL (ref 0.1–0.9)
Monocytes: 5 %
Neutrophils Absolute: 10.4 10*3/uL — ABNORMAL HIGH (ref 1.4–7.0)
Neutrophils: 74 %
Platelets: 276 10*3/uL (ref 150–450)
RBC: 3.95 x10E6/uL (ref 3.77–5.28)
RDW: 13.2 % (ref 11.7–15.4)
RPR Ser Ql: NONREACTIVE
WBC: 13.9 10*3/uL — ABNORMAL HIGH (ref 3.4–10.8)

## 2019-08-30 ENCOUNTER — Other Ambulatory Visit: Payer: Self-pay | Admitting: Advanced Practice Midwife

## 2019-08-30 DIAGNOSIS — R7309 Other abnormal glucose: Secondary | ICD-10-CM

## 2019-08-30 NOTE — Progress Notes (Unsigned)
Order placed for 3 hr gtt- 1 hr elevated at 208. Patient notified to call office for lab-only visit.

## 2019-09-05 ENCOUNTER — Observation Stay
Admission: EM | Admit: 2019-09-05 | Discharge: 2019-09-05 | Disposition: A | Discharge status: Home / Self Care | Payer: Managed Care, Other (non HMO) | Attending: Maternal Newborn | Admitting: Maternal Newborn

## 2019-09-05 ENCOUNTER — Telehealth: Payer: Self-pay

## 2019-09-05 DIAGNOSIS — Z79899 Other long term (current) drug therapy: Secondary | ICD-10-CM | POA: Insufficient documentation

## 2019-09-05 DIAGNOSIS — Z3A29 29 weeks gestation of pregnancy: Secondary | ICD-10-CM

## 2019-09-05 DIAGNOSIS — M549 Dorsalgia, unspecified: Secondary | ICD-10-CM

## 2019-09-05 DIAGNOSIS — R109 Unspecified abdominal pain: Secondary | ICD-10-CM | POA: Insufficient documentation

## 2019-09-05 DIAGNOSIS — O26893 Other specified pregnancy related conditions, third trimester: Principal | ICD-10-CM | POA: Insufficient documentation

## 2019-09-05 DIAGNOSIS — Z87891 Personal history of nicotine dependence: Secondary | ICD-10-CM | POA: Insufficient documentation

## 2019-09-05 DIAGNOSIS — O10913 Unspecified pre-existing hypertension complicating pregnancy, third trimester: Secondary | ICD-10-CM

## 2019-09-05 LAB — COMPREHENSIVE METABOLIC PANEL
ALT: 19 U/L (ref 0–44)
AST: 19 U/L (ref 15–41)
Albumin: 2.8 g/dL — ABNORMAL LOW (ref 3.5–5.0)
Alkaline Phosphatase: 99 U/L (ref 38–126)
Anion gap: 11 (ref 5–15)
BUN: 6 mg/dL (ref 6–20)
CO2: 19 mmol/L — ABNORMAL LOW (ref 22–32)
Calcium: 8.6 mg/dL — ABNORMAL LOW (ref 8.9–10.3)
Chloride: 107 mmol/L (ref 98–111)
Creatinine, Ser: 0.44 mg/dL (ref 0.44–1.00)
GFR calc Af Amer: >60 mL/min (ref >60)
GFR calc non Af Amer: >60 mL/min (ref >60)
Glucose, Bld: 127 mg/dL — ABNORMAL HIGH (ref 70–99)
Potassium: 3.7 mmol/L (ref 3.5–5.1)
Sodium: 137 mmol/L (ref 135–145)
Total Bilirubin: 0.2 mg/dL — ABNORMAL LOW (ref 0.3–1.2)
Total Protein: 6.5 g/dL (ref 6.5–8.1)

## 2019-09-05 LAB — CBC
HCT: 33.1 % — ABNORMAL LOW (ref 36.0–46.0)
Hemoglobin: 10.9 g/dL — ABNORMAL LOW (ref 12.0–15.0)
MCH: 27.8 pg (ref 26.0–34.0)
MCHC: 32.9 g/dL (ref 30.0–36.0)
MCV: 84.4 fL (ref 80.0–100.0)
Platelets: 259 10*3/uL (ref 150–400)
RBC: 3.92 MIL/uL (ref 3.87–5.11)
RDW: 14.2 % (ref 11.5–15.5)
WBC: 14.9 10*3/uL — ABNORMAL HIGH (ref 4.0–10.5)
nRBC: 0 % (ref 0.0–0.2)

## 2019-09-05 LAB — URINALYSIS, COMPLETE (UACMP) WITH MICROSCOPIC
Bacteria, UA: NONE SEEN
Bilirubin Urine: NEGATIVE
Glucose, UA: NEGATIVE mg/dL
Hgb urine dipstick: NEGATIVE
Ketones, ur: NEGATIVE mg/dL
Leukocytes,Ua: NEGATIVE
Nitrite: NEGATIVE
Protein, ur: NEGATIVE mg/dL
Specific Gravity, Urine: 1.011 (ref 1.005–1.030)
pH: 6 (ref 5.0–8.0)

## 2019-09-05 LAB — PROTEIN / CREATININE RATIO, URINE
Creatinine, Urine: 82 mg/dL
Protein Creatinine Ratio: 0.1 mg/mg{Cre} (ref 0.00–0.15)
Total Protein, Urine: 8 mg/dL

## 2019-09-05 NOTE — Telephone Encounter (Signed)
She should drink water until her urine is clear to light yellow, take some tylenol and rest. If she is having contractions that are every 5-7 minutes or closer and lasting for a minute each and getting stronger and closer she should be evaluated. Is baby moving well? Any fluid leaking or bleeding? She may just be dehydrated.

## 2019-09-05 NOTE — OB Triage Note (Signed)
Presents with complaints of cramping on ad off since last night around 8pm. States about every 77mins or so. No bleeding or leaking of fluid. No symptoms of UTI

## 2019-09-05 NOTE — Telephone Encounter (Signed)
Pt reports pain under her ribs, belly gets hard with these pains, pain is a 3 or a 4. Took tums that did not help. Please advise should she be seen?

## 2019-09-05 NOTE — Telephone Encounter (Signed)
Patient is calling to speak with a provider. Please advise

## 2019-09-05 NOTE — Telephone Encounter (Signed)
Pt reports drinking water all day, urine clear, baby is moving more than normal. No bleeding or leaking fluid.

## 2019-09-05 NOTE — Discharge Summary (Signed)
Physician Final Progress Note  Patient ID: Frances Lamb MRN: [id number] DOB/AGE: 05/03/1996 23 y.o.  Admit date: 09/05/2019 Admitting provider: Natale Milch, MD Discharge date: 09/05/2019  Admission Diagnoses: Abdominal pain during pregnancy, Back pain during pregnancy, Chronic hypertension  Discharge Diagnoses: Same  History of Present Illness: The patient is a 23 y.o. female G2P0010 at [redacted]w[redacted]d who presents for intermittent abdominal pain and back pain that she rates 3-4/10. This began last night around 2000 and has persisted throughout the day today. Also has some rib pain, mostly on the right side. Has chronic hypertension and has taken blood pressure at home with no abnormal readings. She was advised to come to triage for monitoring and labs after calling the office. She denies headaches, epigastric pain, visual changes, and edema. Baby has been moving well. No vaginal bleeding or loss of fluid. Has been well hydrated and no dysuria.  Review of Systems: Review of systems negative unless otherwise noted in HPI.   Past Medical History:  Diagnosis Date  . Migraine   . Miscarriage     No past surgical history on file.  No current facility-administered medications on file prior to encounter.    Current Outpatient Medications on File Prior to Encounter  Medication Sig Dispense Refill  . DULoxetine (CYMBALTA) 60 MG capsule Take 60 mg by mouth daily.    . hydrOXYzine (ATARAX/VISTARIL) 50 MG tablet TK 1 TO 2 TS PO HS    . metoprolol succinate (TOPROL-XL) 25 MG 24 hr tablet Take 1 tablet (25 mg total) by mouth daily. 30 tablet 2  . ondansetron (ZOFRAN ODT) 4 MG disintegrating tablet Take 1 tablet (4 mg total) by mouth every 6 (six) hours as needed for nausea. 20 tablet 0  . Prenatal Vit-Fe Fumarate-FA (PRENATAL COMPLETE PO) Take 1 tablet by mouth daily.      No Known Allergies  Social History   Socioeconomic History  . Marital status: Married    Spouse name: Not on file  .  Number of children: Not on file  . Years of education: Not on file  . Highest education level: Not on file  Occupational History  . Not on file  Social Needs  . Financial resource strain: Not on file  . Food insecurity    Worry: Not on file    Inability: Not on file  . Transportation needs    Medical: Not on file    Non-medical: Not on file  Tobacco Use  . Smoking status: Former Games developer  . Smokeless tobacco: Current User  Substance and Sexual Activity  . Alcohol use: Yes  . Drug use: Never  . Sexual activity: Yes    Birth control/protection: Pill  Lifestyle  . Physical activity    Days per week: Not on file    Minutes per session: Not on file  . Stress: Not on file  Relationships  . Social Musician on phone: Not on file    Gets together: Not on file    Attends religious service: Not on file    Active member of club or organization: Not on file    Attends meetings of clubs or organizations: Not on file    Relationship status: Not on file  . Intimate partner violence    Fear of current or ex partner: Not on file    Emotionally abused: Not on file    Physically abused: Not on file    Forced sexual activity: Not on file  Other  Topics Concern  . Not on file  Social History Narrative  . Not on file    Family history: Negative/unremarkable except as detailed in HPI. No family history of birth defects.   Physical Exam: BP 120/72 (BP Location: Left Arm)   Pulse (!) 102   Temp 98.4 F (36.9 C) (Oral)   Resp 18   LMP 02/03/2019   Gen: NAD CV: Mild tachycardia Pulm: No increased work of breathing Abdomen: gravid, non-tender Pelvic: patient declined exam Ext: trace edema feet and ankles, non-pitting  NST Baseline: 145 bpm Variability: moderate Accelerations: present Decelerations: absent Tocometry: no contractions The patient was monitored for 30+ minutes, fetal heart rate tracing was deemed reactive.  Hospital Course: Patient was monitored with  reactive NST and no contractions on tocometer. CBC, CMP and P/C ratio returned with normal values. Had normotensive BP on arrival and one mild range BP at 130/91.  Procedures: NST  Discharge Condition: stable  Disposition: Discharge disposition: 01-Home or Self Care       Diet: Regular diet  Discharge Activity: Activity as tolerated  Discharge Instructions    Discharge activity:  No Restrictions   Complete by: As directed    Discharge diet:  No restrictions   Complete by: As directed    No sexual activity restrictions   Complete by: As directed    Notify physician for a general feeling that "something is not right"   Complete by: As directed    Notify physician for increase or change in vaginal discharge   Complete by: As directed    Notify physician for intestinal cramps, with or without diarrhea, sometimes described as "gas pain"   Complete by: As directed    Notify physician for leaking of fluid   Complete by: As directed    Notify physician for low, dull backache, unrelieved by heat or Tylenol   Complete by: As directed    Notify physician for menstrual like cramps   Complete by: As directed    Notify physician for pelvic pressure   Complete by: As directed    Notify physician for uterine contractions.  These may be painless and feel like the uterus is tightening or the baby is  "balling up"   Complete by: As directed    Notify physician for vaginal bleeding   Complete by: As directed    PRETERM LABOR:  Includes any of the follwing symptoms that occur between 20 - [redacted] weeks gestation.  If these symptoms are not stopped, preterm labor can result in preterm delivery, placing your baby at risk   Complete by: As directed      Allergies as of 09/05/2019   No Known Allergies     Medication List    TAKE these medications   DULoxetine 60 MG capsule Commonly known as: CYMBALTA Take 60 mg by mouth daily.   hydrOXYzine 50 MG tablet Commonly known as:  ATARAX/VISTARIL TK 1 TO 2 TS PO HS   metoprolol succinate 25 MG 24 hr tablet Commonly known as: TOPROL-XL Take 1 tablet (25 mg total) by mouth daily.   ondansetron 4 MG disintegrating tablet Commonly known as: Zofran ODT Take 1 tablet (4 mg total) by mouth every 6 (six) hours as needed for nausea.   PRENATAL COMPLETE PO Take 1 tablet by mouth daily.      Discussed discomforts of third trimester that may be causing symptoms, such as Braxton Hicks contractions, round ligament pain, and pressure from growing fetus. Recommended comfort measures. Continue to  monitor BP at home and notify of abnormal values. Will return for evaluation if she has increase in pain on right side or symptoms of pre-eclampsia or preterm labor.  Signed: Oswaldo ConroyJacelyn Y , CNM  09/05/2019, 7:44 PM

## 2019-09-05 NOTE — Telephone Encounter (Signed)
Spoke with patient. She has had regular and frequent tightening with pain in her abdomen last night and today. The sensation lasts for a minute or two and happens every 3 or 4 minutes. She has some pain in her ribs- bilateral but more on her right. She denies headache or visual changes. Her blood pressure has been normal to this point in the pregnancy and she has not checked it today. She admits adequate hydration. Suggested she go to L&D for evaluation- monitors, labs, BP.

## 2019-09-05 NOTE — Telephone Encounter (Signed)
Pt called triage lastnight reporting pain under right rib. Called pt to follow up on this, no answer so I left message

## 2019-09-10 ENCOUNTER — Encounter: Payer: BC Managed Care – PPO | Admitting: Advanced Practice Midwife

## 2019-09-13 ENCOUNTER — Encounter: Payer: BC Managed Care – PPO | Admitting: Maternal Newborn

## 2019-09-13 ENCOUNTER — Other Ambulatory Visit: Payer: Self-pay

## 2019-09-13 ENCOUNTER — Ambulatory Visit (INDEPENDENT_AMBULATORY_CARE_PROVIDER_SITE_OTHER): Payer: BC Managed Care – PPO | Admitting: Maternal Newborn

## 2019-09-13 ENCOUNTER — Other Ambulatory Visit: Payer: BC Managed Care – PPO

## 2019-09-13 VITALS — BP 126/76 | Wt 264.0 lb

## 2019-09-13 DIAGNOSIS — O0993 Supervision of high risk pregnancy, unspecified, third trimester: Secondary | ICD-10-CM

## 2019-09-13 DIAGNOSIS — Z3689 Encounter for other specified antenatal screening: Secondary | ICD-10-CM

## 2019-09-13 DIAGNOSIS — R7309 Other abnormal glucose: Secondary | ICD-10-CM

## 2019-09-13 DIAGNOSIS — Z3A30 30 weeks gestation of pregnancy: Secondary | ICD-10-CM

## 2019-09-13 NOTE — Progress Notes (Signed)
Pt went to hospital for braxton hicks. No vb. No lof. 3 hr gtt today

## 2019-09-13 NOTE — Patient Instructions (Signed)
Third Trimester of Pregnancy The third trimester is from week 28 through week 40 (months 7 through 9). The third trimester is a time when the unborn baby (fetus) is growing rapidly. At the end of the ninth month, the fetus is about 20 inches in length and weighs 6-10 pounds. Body changes during your third trimester Your body will continue to go through many changes during pregnancy. The changes vary from woman to woman. During the third trimester:  Your weight will continue to increase. You can expect to gain 25-35 pounds (11-16 kg) by the end of the pregnancy.  You may begin to get stretch marks on your hips, abdomen, and breasts.  You may urinate more often because the fetus is moving lower into your pelvis and pressing on your bladder.  You may develop or continue to have heartburn. This is caused by increased hormones that slow down muscles in the digestive tract.  You may develop or continue to have constipation because increased hormones slow digestion and cause the muscles that push waste through your intestines to relax.  You may develop hemorrhoids. These are swollen veins (varicose veins) in the rectum that can itch or be painful.  You may develop swollen, bulging veins (varicose veins) in your legs.  You may have increased body aches in the pelvis, back, or thighs. This is due to weight gain and increased hormones that are relaxing your joints.  You may have changes in your hair. These can include thickening of your hair, rapid growth, and changes in texture. Some women also have hair loss during or after pregnancy, or hair that feels dry or thin. Your hair will most likely return to normal after your baby is born.  Your breasts will continue to grow and they will continue to become tender. A yellow fluid (colostrum) may leak from your breasts. This is the first milk you are producing for your baby.  Your belly button may stick out.  You may notice more swelling in your hands,  face, or ankles.  You may have increased tingling or numbness in your hands, arms, and legs. The skin on your belly may also feel numb.  You may feel short of breath because of your expanding uterus.  You may have more problems sleeping. This can be caused by the size of your belly, increased need to urinate, and an increase in your body's metabolism.  You may notice the fetus "dropping," or moving lower in your abdomen (lightening).  You may have increased vaginal discharge.  You may notice your joints feel loose and you may have pain around your pelvic bone. What to expect at prenatal visits You will have prenatal exams every 2 weeks until week 36. Then you will have weekly prenatal exams. During a routine prenatal visit:  You will be weighed to make sure you and the baby are growing normally.  Your blood pressure will be taken.  Your abdomen will be measured to track your baby's growth.  The fetal heartbeat will be listened to.  Any test results from the previous visit will be discussed.  You may have a cervical check near your due date to see if your cervix has softened or thinned (effaced).  You will be tested for Group B streptococcus. This happens between 35 and 37 weeks. Your health care provider may ask you:  What your birth plan is.  How you are feeling.  If you are feeling the baby move.  If you have had any abnormal   symptoms, such as leaking fluid, bleeding, severe headaches, or abdominal cramping.  If you are using any tobacco products, including cigarettes, chewing tobacco, and electronic cigarettes.  If you have any questions. Other tests or screenings that may be performed during your third trimester include:  Blood tests that check for low iron levels (anemia).  Fetal testing to check the health, activity level, and growth of the fetus. Testing is done if you have certain medical conditions or if there are problems during the pregnancy.  Nonstress test  (NST). This test checks the health of your baby to make sure there are no signs of problems, such as the baby not getting enough oxygen. During this test, a belt is placed around your belly. The baby is made to move, and its heart rate is monitored during movement. What is false labor? False labor is a condition in which you feel small, irregular tightenings of the muscles in the womb (contractions) that usually go away with rest, changing position, or drinking water. These are called Braxton Hicks contractions. Contractions may last for hours, days, or even weeks before true labor sets in. If contractions come at regular intervals, become more frequent, increase in intensity, or become painful, you should see your health care provider. What are the signs of labor?  Abdominal cramps.  Regular contractions that start at 10 minutes apart and become stronger and more frequent with time.  Contractions that start on the top of the uterus and spread down to the lower abdomen and back.  Increased pelvic pressure and dull back pain.  A watery or bloody mucus discharge that comes from the vagina.  Leaking of amniotic fluid. This is also known as your "water breaking." It could be a slow trickle or a gush. Let your health care provider know if it has a color or strange odor. If you have any of these signs, call your health care provider right away, even if it is before your due date. Follow these instructions at home: Medicines  Follow your health care provider's instructions regarding medicine use. Specific medicines may be either safe or unsafe to take during pregnancy.  Take a prenatal vitamin that contains at least 600 micrograms (mcg) of folic acid.  If you develop constipation, try taking a stool softener if your health care provider approves. Eating and drinking   Eat a balanced diet that includes fresh fruits and vegetables, whole grains, good sources of protein such as meat, eggs, or tofu,  and low-fat dairy. Your health care provider will help you determine the amount of weight gain that is right for you.  Avoid raw meat and uncooked cheese. These carry germs that can cause birth defects in the baby.  If you have low calcium intake from food, talk to your health care provider about whether you should take a daily calcium supplement.  Eat four or five small meals rather than three large meals a day.  Limit foods that are high in fat and processed sugars, such as fried and sweet foods.  To prevent constipation: ? Drink enough fluid to keep your urine clear or pale yellow. ? Eat foods that are high in fiber, such as fresh fruits and vegetables, whole grains, and beans. Activity  Exercise only as directed by your health care provider. Most women can continue their usual exercise routine during pregnancy. Try to exercise for 30 minutes at least 5 days a week. Stop exercising if you experience uterine contractions.  Avoid heavy lifting.  Do   not exercise in extreme heat or humidity, or at high altitudes.  Wear low-heel, comfortable shoes.  Practice good posture.  You may continue to have sex unless your health care provider tells you otherwise. Relieving pain and discomfort  Take frequent breaks and rest with your legs elevated if you have leg cramps or low back pain.  Take warm sitz baths to soothe any pain or discomfort caused by hemorrhoids. Use hemorrhoid cream if your health care provider approves.  Wear a good support bra to prevent discomfort from breast tenderness.  If you develop varicose veins: ? Wear support pantyhose or compression stockings as told by your healthcare provider. ? Elevate your feet for 15 minutes, 3-4 times a day. Prenatal care  Write down your questions. Take them to your prenatal visits.  Keep all your prenatal visits as told by your health care provider. This is important. Safety  Wear your seat belt at all times when driving.  Make  a list of emergency phone numbers, including numbers for family, friends, the hospital, and police and fire departments. General instructions  Avoid cat litter boxes and soil used by cats. These carry germs that can cause birth defects in the baby. If you have a cat, ask someone to clean the litter box for you.  Do not travel far distances unless it is absolutely necessary and only with the approval of your health care provider.  Do not use hot tubs, steam rooms, or saunas.  Do not drink alcohol.  Do not use any products that contain nicotine or tobacco, such as cigarettes and e-cigarettes. If you need help quitting, ask your health care provider.  Do not use any medicinal herbs or unprescribed drugs. These chemicals affect the formation and growth of the baby.  Do not douche or use tampons or scented sanitary pads.  Do not cross your legs for long periods of time.  To prepare for the arrival of your baby: ? Take prenatal classes to understand, practice, and ask questions about labor and delivery. ? Make a trial run to the hospital. ? Visit the hospital and tour the maternity area. ? Arrange for maternity or paternity leave through employers. ? Arrange for family and friends to take care of pets while you are in the hospital. ? Purchase a rear-facing car seat and make sure you know how to install it in your car. ? Pack your hospital bag. ? Prepare the baby's nursery. Make sure to remove all pillows and stuffed animals from the baby's crib to prevent suffocation.  Visit your dentist if you have not gone during your pregnancy. Use a soft toothbrush to brush your teeth and be gentle when you floss. Contact a health care provider if:  You are unsure if you are in labor or if your water has broken.  You become dizzy.  You have mild pelvic cramps, pelvic pressure, or nagging pain in your abdominal area.  You have lower back pain.  You have persistent nausea, vomiting, or diarrhea.   You have an unusual or bad smelling vaginal discharge.  You have pain when you urinate. Get help right away if:  Your water breaks before 37 weeks.  You have regular contractions less than 5 minutes apart before 37 weeks.  You have a fever.  You are leaking fluid from your vagina.  You have spotting or bleeding from your vagina.  You have severe abdominal pain or cramping.  You have rapid weight loss or weight gain.  You have   shortness of breath with chest pain.  You notice sudden or extreme swelling of your face, hands, ankles, feet, or legs.  Your baby makes fewer than 10 movements in 2 hours.  You have severe headaches that do not go away when you take medicine.  You have vision changes. Summary  The third trimester is from week 28 through week 40, months 7 through 9. The third trimester is a time when the unborn baby (fetus) is growing rapidly.  During the third trimester, your discomfort may increase as you and your baby continue to gain weight. You may have abdominal, leg, and back pain, sleeping problems, and an increased need to urinate.  During the third trimester your breasts will keep growing and they will continue to become tender. A yellow fluid (colostrum) may leak from your breasts. This is the first milk you are producing for your baby.  False labor is a condition in which you feel small, irregular tightenings of the muscles in the womb (contractions) that eventually go away. These are called Braxton Hicks contractions. Contractions may last for hours, days, or even weeks before true labor sets in.  Signs of labor can include: abdominal cramps; regular contractions that start at 10 minutes apart and become stronger and more frequent with time; watery or bloody mucus discharge that comes from the vagina; increased pelvic pressure and dull back pain; and leaking of amniotic fluid. This information is not intended to replace advice given to you by your health  care provider. Make sure you discuss any questions you have with your health care provider. Document Released: 11/09/2001 Document Revised: 03/08/2019 Document Reviewed: 12/21/2016 Elsevier Patient Education  2020 [employer]  

## 2019-09-13 NOTE — Progress Notes (Signed)
Routine Prenatal Care Visit  Subjective  Frances Lamb is a 23 y.o. G2P0010 at [redacted]w[redacted]d being seen today for ongoing prenatal care.  She is currently monitored for the following issues for this high-risk pregnancy and has Chronic hypertension affecting pregnancy; Supervision of high-risk pregnancy; Bipolar disease during pregnancy in first trimester (HCC); Obesity, Class III, BMI 40-49.9 (morbid obesity) (HCC); Anxiety state; and Indication for care in labor and delivery, antepartum on their problem list.  ----------------------------------------------------------------------------------- Patient reports some round ligament and back pain, occasional Braxton Hicks contractions. Contractions: Not present. Vag. Bleeding: None.  Movement: Present. No leaking of fluid.  ----------------------------------------------------------------------------------- The following portions of the patient's history were reviewed and updated as appropriate: allergies, current medications, past family history, past medical history, past social history, past surgical history and problem list. Problem list updated.  Objective  Blood pressure 126/76, weight 264 lb (119.7 kg), last menstrual period 02/03/2019. Pregravid weight 230 lb (104.3 kg) Total Weight Gain 34 lb (15.4 kg)  Fetal Status: Fetal Heart Rate (bpm): 149 Fundal Height: 31 cm Movement: Present     General:  Alert, oriented and cooperative. Patient is in no acute distress.  Skin: Skin is warm and dry. No rash noted.   Cardiovascular: Normal heart rate noted  Respiratory: Normal respiratory effort, no problems with respiration noted  Abdomen: Soft, gravid, appropriate for gestational age. Pain/Pressure: Present     Pelvic:  Cervical exam deferred        Extremities: Normal range of motion.     Mental Status: Normal mood and affect. Normal behavior. Normal judgment and thought content.     Assessment   23 y.o. G2P0010 at [redacted]w[redacted]d, EDD 11/17/2019 by  Ultrasound presenting for a routine prenatal visit.  Plan   pregnancy2 Problems (from 02/03/19 to present)    Problem Noted Resolved   Chronic hypertension affecting pregnancy 03/14/2019 by Nadara Mustard, MD No   Overview Signed 08/27/2019 11:41 AM by Conard Novak, MD    [ ]  Aspirin 81 mg daily after 12 weeks; discontinue after 36 weeks [x]  baseline labs with CBC, CMP, urine protein/creatinine ratio [n/a ] no BP meds unless BPs become elevated [ ]  ultrasound for growth at 28[x] , 32[ ] , 36 weeks [ ]  [ ]  Aspirin 81 mg daily after 12 weeks; discontinue after 36 weeks  Current antihypertensives:  metoprolol 25 mg Qdaily   Baseline and surveillance labs (pulled in from [facility], refresh links as needed)  Lab Results  Component Value Date   PLT 275 06/24/2019   CREATININE 0.48 06/24/2019   AST 16 06/24/2019   ALT 15 06/24/2019    Antenatal Testing CHTN - O10.919  Group I  BP < 140/90, no preeclampsia, AGA,  nml AFV, +/- meds    Group II BP > 140/90, on meds, no preeclampsia, AGA, nml AFV  20-28-34-38  20-24-28-32-35-38  32//2 x wk  28//BPP wkly then 32//2 x wk  40 no meds; 39 meds  PRN or 37  Pre-eclampsia  GHTN - O13.9/Preeclampsia without severe features  - O14.00   Preeclampsia with severe features - O14.10  Q 3-4wks  Q 2 wks  28//BPP wkly then 32//2 x wk  Inpatient  37  PRN or 34         Supervision of high-risk pregnancy 03/14/2019 by 06/26/2019, MD No   Overview Addendum 05/23/2019  3:59 PM by [birth year]1971, MD    [facility] Prenatal Labs  Dating 08-27-1991 6 weeks Blood type:  O/Positive/-- (04/16 1418)   Genetic Screen  NIPS: normal XY Antibody:Negative (04/16 1418)  Anatomic [place]  Rubella: 1.10 (04/16 1418) Varicella: NONIMMUNE  GTT Early:  91              Third trimester:  RPR: Non Reactive (04/16 1418)   Rhogam O POS HBsAg: Negative (04/16 1418)   TDaP vaccine                  Flu Shot:2019 done; needs fall 2020 HIV: Non Reactive (04/16  1418)   Baby Food    Breast                            GBS:   Contraception  Pap: 2020 NIL  Tob  Encouraged cessation ecigs   CS/VBAC na   Support Person Husband           Bipolar disease during pregnancy in first trimester ([place]) 03/14/2019 by Gae Dry, MD No   Obesity, Class III, BMI 40-49.9 (morbid obesity) ([place]) 03/14/2019 by Gae Dry, MD No   Overview Signed 08/27/2019 11:41 AM by Will Bonnet, MD    BMI >=40 [ ]  early 1h gtt -  [ ]  u/s for dating [ ]   [ ]  nutritional goals [ ]  folic acid 1mg  [ ]  bASA (>12 weeks) [ ]  consider nutrition consult [ ]  consider maternal EKG 1st trimester [ ]  Growth u/s 28 [ ] , 32 [ ] , 36 weeks [ ]  [ ]  NST/AFI weekly 36+ weeks (36[] , 37[] , 38[] , 39[] , 40[] ) [ ]  IOL by 41 weeks (scheduled, prn [] )          3 hour GTT was done today. Discussed APT beginning at 32 weeks.  Please refer to After Visit Summary for other counseling recommendations.   Return in about 2 weeks (around 09/27/2019) for ROB with NST/AFI.  Avel Sensor, CNM 09/13/2019  1:53 PM

## 2019-09-14 LAB — GESTATIONAL GLUCOSE TOLERANCE
Glucose, Fasting: 91 mg/dL (ref 65–94)
Glucose, GTT - 1 Hour: 221 mg/dL — ABNORMAL HIGH (ref 65–179)
Glucose, GTT - 2 Hour: 190 mg/dL — ABNORMAL HIGH (ref 65–154)
Glucose, GTT - 3 Hour: 111 mg/dL (ref 65–139)

## 2019-09-18 ENCOUNTER — Telehealth: Payer: Self-pay | Admitting: Advanced Practice Midwife

## 2019-09-18 ENCOUNTER — Other Ambulatory Visit: Payer: Self-pay | Admitting: Advanced Practice Midwife

## 2019-09-18 DIAGNOSIS — O24419 Gestational diabetes mellitus in pregnancy, unspecified control: Secondary | ICD-10-CM

## 2019-09-18 MED ORDER — ONETOUCH VERIO FLEX SYSTEM W/DEVICE KIT: PACK | 0 refills | Status: DC

## 2019-09-18 NOTE — Progress Notes (Unsigned)
Rx Verio Flex meter/kit sent to [facility].

## 2019-09-18 NOTE — Telephone Encounter (Signed)
Patient returning [place] call, same cb.

## 2019-09-18 NOTE — Telephone Encounter (Signed)
Spoke with patient and answered her questions regarding GDM.

## 2019-09-27 ENCOUNTER — Ambulatory Visit (INDEPENDENT_AMBULATORY_CARE_PROVIDER_SITE_OTHER): Payer: BC Managed Care – PPO | Admitting: Maternal Newborn

## 2019-09-27 ENCOUNTER — Encounter: Payer: Self-pay | Admitting: Maternal Newborn

## 2019-09-27 ENCOUNTER — Ambulatory Visit (INDEPENDENT_AMBULATORY_CARE_PROVIDER_SITE_OTHER): Payer: BC Managed Care – PPO

## 2019-09-27 ENCOUNTER — Other Ambulatory Visit: Payer: Self-pay

## 2019-09-27 VITALS — BP 124/70 | Wt 265.0 lb

## 2019-09-27 DIAGNOSIS — O24419 Gestational diabetes mellitus in pregnancy, unspecified control: Secondary | ICD-10-CM

## 2019-09-27 DIAGNOSIS — Z3A32 32 weeks gestation of pregnancy: Secondary | ICD-10-CM

## 2019-09-27 DIAGNOSIS — O0993 Supervision of high risk pregnancy, unspecified, third trimester: Secondary | ICD-10-CM

## 2019-09-27 DIAGNOSIS — Z3689 Encounter for other specified antenatal screening: Secondary | ICD-10-CM

## 2019-09-27 DIAGNOSIS — O10913 Unspecified pre-existing hypertension complicating pregnancy, third trimester: Secondary | ICD-10-CM

## 2019-09-27 NOTE — Progress Notes (Signed)
Routine Prenatal Care Visit  Subjective  Frances Lamb is a 23 y.o. G2P0010 at [redacted]w[redacted]d being seen today for ongoing prenatal care.  She is currently monitored for the following issues for this high-risk pregnancy and has Chronic hypertension affecting pregnancy; Supervision of high-risk pregnancy; Bipolar disease during pregnancy in first trimester (HCC); Obesity, Class III, BMI 40-49.9 (morbid obesity) (HCC); Anxiety state; and Gestational diabetes mellitus (GDM) in third trimester on their problem list.  ----------------------------------------------------------------------------------- Patient reports backache.   Contractions: Not present. Vag. Bleeding: None.  Movement: Present. No leaking of fluid.  ----------------------------------------------------------------------------------- The following portions of the patient's history were reviewed and updated as appropriate: allergies, current medications, past family history, past medical history, past social history, past surgical history and problem list. Problem list updated.   Objective  Blood pressure 124/70, weight 265 lb (120.2 kg), last menstrual period 02/03/2019. Pregravid weight 230 lb (104.3 kg) Total Weight Gain 35 lb (15.9 kg)  Fetal Status: Fetal Heart Rate (bpm): 145   Movement: Present     General:  Alert, oriented and cooperative. Patient is in no acute distress.  Skin: Skin is warm and dry. No rash noted.   Cardiovascular: Normal heart rate noted  Respiratory: Normal respiratory effort, no problems with respiration noted  Abdomen: Soft, gravid, appropriate for gestational age. Pain/Pressure: Present     Pelvic:  Cervical exam deferred        Extremities: Normal range of motion.  Edema: Trace  Mental Status: Normal mood and affect. Normal behavior. Normal judgment and thought content.   NST Baseline: 145 bpm Variability: moderate Accelerations: present Decelerations: absent Tocometry: not done The patient was  monitored for 20 minutes, fetal heart rate tracing was deemed reactive.  Assessment   23 y.o. G2P0010 at [redacted]w[redacted]d, EDD 11/17/2019 by Ultrasound presenting for a routine prenatal visit.  Plan   pregnancy2 Problems (from 02/03/19 to present)    Problem Noted Resolved   Chronic hypertension affecting pregnancy 03/14/2019 by Nadara Mustard, MD No   Overview Signed 08/27/2019 11:41 AM by Conard Novak, MD    [ ]  Aspirin 81 mg daily after 12 weeks; discontinue after 36 weeks [x]  baseline labs with CBC, CMP, urine protein/creatinine ratio [n/a ] no BP meds unless BPs become elevated [ ]  ultrasound for growth at 28[x] , 32[ ] , 36 weeks [ ]  [ ]  Aspirin 81 mg daily after 12 weeks; discontinue after 36 weeks  Current antihypertensives:  metoprolol 25 mg Qdaily   Baseline and surveillance labs (pulled in from [facility], refresh links as needed)  Lab Results  Component Value Date   PLT 275 06/24/2019   CREATININE 0.48 06/24/2019   AST 16 06/24/2019   ALT 15 06/24/2019    Antenatal Testing CHTN - O10.919  Group I  BP < 140/90, no preeclampsia, AGA,  nml AFV, +/- meds    Group II BP > 140/90, on meds, no preeclampsia, AGA, nml AFV  20-28-34-38  20-24-28-32-35-38  32//2 x wk  28//BPP wkly then 32//2 x wk  40 no meds; 39 meds  PRN or 37  Pre-eclampsia  GHTN - O13.9/Preeclampsia without severe features  - O14.00   Preeclampsia with severe features - O14.10  Q 3-4wks  Q 2 wks  28//BPP wkly then 32//2 x wk  Inpatient  37  PRN or 34         Supervision of high-risk pregnancy 03/14/2019 by 06/26/2019, MD No   Overview Addendum 09/18/2019 10:06 AM by 08-30-1973,  [place] Prenatal Labs  Dating [place] 6 weeks Blood type: O/Positive/-- (04/16 1418)   Genetic Screen  NIPS: normal XY Antibody:Negative (04/16 1418)  Anatomic [place]  Rubella: 1.10 (04/16 1418) Varicella: NONIMMUNE  GTT Early:  91              28wk: GDM- [3 hr 2/4 abnml]  RPR: Non Reactive (04/16 1418)    Rhogam O POS HBsAg: Negative (04/16 1418)   TDaP vaccine                  Flu Shot:2019 done; needs fall 2020 HIV: Non Reactive (04/16 1418)   Baby Food    Breast                            GBS:   Contraception  Pap: 2020 NIL  Tob  Encouraged cessation ecigs   CS/VBAC na   Support Person Husband           Bipolar disease during pregnancy in first trimester ([place]) 03/14/2019 by Gae Dry, MD No   Obesity, Class III, BMI 40-49.9 (morbid obesity) ([place]) 03/14/2019 by Gae Dry, MD No   Overview Signed 08/27/2019 11:41 AM by Will Bonnet, MD    BMI >=40 [ ]  early 1h gtt -  [ ]  u/s for dating [ ]   [ ]  nutritional goals [ ]  folic acid 1mg  [ ]  bASA (>12 weeks) [ ]  consider nutrition consult [ ]  consider maternal EKG 1st trimester [ ]  Growth u/s 28 [ ] , 32 [ ] , 36 weeks [ ]  [ ]  NST/AFI weekly 36+ weeks (36[] , 37[] , 38[] , 39[] , 40[] ) [ ]  IOL by 41 weeks (scheduled, prn [] )        Reactive NST. AFI 13 cm, cephalic presentation.  Discussed blood glucose monitoring. Has not been contacted for [facility] appointment and does not know how to use meter. Urgent referral today and asked her to bring her supplies to the next prenatal visit so education for checking blood glucose can be completed if she hasn't been able to be seen by then.  Growth scan next time.  Please refer to After Visit Summary for other counseling recommendations.   Return in about 1 week (around 10/04/2019) for ROB with NST/AFI/growth.  Avel Sensor, CNM 09/27/2019  3:36 PM

## 2019-09-27 NOTE — Progress Notes (Signed)
No vb. No lof.  

## 2019-09-28 NOTE — Patient Instructions (Signed)
Third Trimester of Pregnancy The third trimester is from week 28 through week 40 (months 7 through 9). The third trimester is a time when the unborn baby (fetus) is growing rapidly. At the end of the ninth month, the fetus is about 20 inches in length and weighs 6-10 pounds. Body changes during your third trimester Your body will continue to go through many changes during pregnancy. The changes vary from woman to woman. During the third trimester:  Your weight will continue to increase. You can expect to gain 25-35 pounds (11-16 kg) by the end of the pregnancy.  You may begin to get stretch marks on your hips, abdomen, and breasts.  You may urinate more often because the fetus is moving lower into your pelvis and pressing on your bladder.  You may develop or continue to have heartburn. This is caused by increased hormones that slow down muscles in the digestive tract.  You may develop or continue to have constipation because increased hormones slow digestion and cause the muscles that push waste through your intestines to relax.  You may develop hemorrhoids. These are swollen veins (varicose veins) in the rectum that can itch or be painful.  You may develop swollen, bulging veins (varicose veins) in your legs.  You may have increased body aches in the pelvis, back, or thighs. This is due to weight gain and increased hormones that are relaxing your joints.  You may have changes in your hair. These can include thickening of your hair, rapid growth, and changes in texture. Some women also have hair loss during or after pregnancy, or hair that feels dry or thin. Your hair will most likely return to normal after your baby is born.  Your breasts will continue to grow and they will continue to become tender. A yellow fluid (colostrum) may leak from your breasts. This is the first milk you are producing for your baby.  Your belly button may stick out.  You may notice more swelling in your hands,  face, or ankles.  You may have increased tingling or numbness in your hands, arms, and legs. The skin on your belly may also feel numb.  You may feel short of breath because of your expanding uterus.  You may have more problems sleeping. This can be caused by the size of your belly, increased need to urinate, and an increase in your body's metabolism.  You may notice the fetus "dropping," or moving lower in your abdomen (lightening).  You may have increased vaginal discharge.  You may notice your joints feel loose and you may have pain around your pelvic bone. What to expect at prenatal visits You will have prenatal exams every 2 weeks until week 36. Then you will have weekly prenatal exams. During a routine prenatal visit:  You will be weighed to make sure you and the baby are growing normally.  Your blood pressure will be taken.  Your abdomen will be measured to track your baby's growth.  The fetal heartbeat will be listened to.  Any test results from the previous visit will be discussed.  You may have a cervical check near your due date to see if your cervix has softened or thinned (effaced).  You will be tested for Group B streptococcus. This happens between 35 and 37 weeks. Your health care provider may ask you:  What your birth plan is.  How you are feeling.  If you are feeling the baby move.  If you have had any abnormal   symptoms, such as leaking fluid, bleeding, severe headaches, or abdominal cramping.  If you are using any tobacco products, including cigarettes, chewing tobacco, and electronic cigarettes.  If you have any questions. Other tests or screenings that may be performed during your third trimester include:  Blood tests that check for low iron levels (anemia).  Fetal testing to check the health, activity level, and growth of the fetus. Testing is done if you have certain medical conditions or if there are problems during the pregnancy.  Nonstress test  (NST). This test checks the health of your baby to make sure there are no signs of problems, such as the baby not getting enough oxygen. During this test, a belt is placed around your belly. The baby is made to move, and its heart rate is monitored during movement. What is false labor? False labor is a condition in which you feel small, irregular tightenings of the muscles in the womb (contractions) that usually go away with rest, changing position, or drinking water. These are called Braxton Hicks contractions. Contractions may last for hours, days, or even weeks before true labor sets in. If contractions come at regular intervals, become more frequent, increase in intensity, or become painful, you should see your health care provider. What are the signs of labor?  Abdominal cramps.  Regular contractions that start at 10 minutes apart and become stronger and more frequent with time.  Contractions that start on the top of the uterus and spread down to the lower abdomen and back.  Increased pelvic pressure and dull back pain.  A watery or bloody mucus discharge that comes from the vagina.  Leaking of amniotic fluid. This is also known as your "water breaking." It could be a slow trickle or a gush. Let your health care provider know if it has a color or strange odor. If you have any of these signs, call your health care provider right away, even if it is before your due date. Follow these instructions at home: Medicines  Follow your health care provider's instructions regarding medicine use. Specific medicines may be either safe or unsafe to take during pregnancy.  Take a prenatal vitamin that contains at least 600 micrograms (mcg) of folic acid.  If you develop constipation, try taking a stool softener if your health care provider approves. Eating and drinking   Eat a balanced diet that includes fresh fruits and vegetables, whole grains, good sources of protein such as meat, eggs, or tofu,  and low-fat dairy. Your health care provider will help you determine the amount of weight gain that is right for you.  Avoid raw meat and uncooked cheese. These carry germs that can cause birth defects in the baby.  If you have low calcium intake from food, talk to your health care provider about whether you should take a daily calcium supplement.  Eat four or five small meals rather than three large meals a day.  Limit foods that are high in fat and processed sugars, such as fried and sweet foods.  To prevent constipation: ? Drink enough fluid to keep your urine clear or pale yellow. ? Eat foods that are high in fiber, such as fresh fruits and vegetables, whole grains, and beans. Activity  Exercise only as directed by your health care provider. Most women can continue their usual exercise routine during pregnancy. Try to exercise for 30 minutes at least 5 days a week. Stop exercising if you experience uterine contractions.  Avoid heavy lifting.  Do   not exercise in extreme heat or humidity, or at high altitudes.  Wear low-heel, comfortable shoes.  Practice good posture.  You may continue to have sex unless your health care provider tells you otherwise. Relieving pain and discomfort  Take frequent breaks and rest with your legs elevated if you have leg cramps or low back pain.  Take warm sitz baths to soothe any pain or discomfort caused by hemorrhoids. Use hemorrhoid cream if your health care provider approves.  Wear a good support bra to prevent discomfort from breast tenderness.  If you develop varicose veins: ? Wear support pantyhose or compression stockings as told by your healthcare provider. ? Elevate your feet for 15 minutes, 3-4 times a day. Prenatal care  Write down your questions. Take them to your prenatal visits.  Keep all your prenatal visits as told by your health care provider. This is important. Safety  Wear your seat belt at all times when driving.  Make  a list of emergency phone numbers, including numbers for family, friends, the hospital, and police and fire departments. General instructions  Avoid cat litter boxes and soil used by cats. These carry germs that can cause birth defects in the baby. If you have a cat, ask someone to clean the litter box for you.  Do not travel far distances unless it is absolutely necessary and only with the approval of your health care provider.  Do not use hot tubs, steam rooms, or saunas.  Do not drink alcohol.  Do not use any products that contain nicotine or tobacco, such as cigarettes and e-cigarettes. If you need help quitting, ask your health care provider.  Do not use any medicinal herbs or unprescribed drugs. These chemicals affect the formation and growth of the baby.  Do not douche or use tampons or scented sanitary pads.  Do not cross your legs for long periods of time.  To prepare for the arrival of your baby: ? Take prenatal classes to understand, practice, and ask questions about labor and delivery. ? Make a trial run to the hospital. ? Visit the hospital and tour the maternity area. ? Arrange for maternity or paternity leave through employers. ? Arrange for family and friends to take care of pets while you are in the hospital. ? Purchase a rear-facing car seat and make sure you know how to install it in your car. ? Pack your hospital bag. ? Prepare the baby's nursery. Make sure to remove all pillows and stuffed animals from the baby's crib to prevent suffocation.  Visit your dentist if you have not gone during your pregnancy. Use a soft toothbrush to brush your teeth and be gentle when you floss. Contact a health care provider if:  You are unsure if you are in labor or if your water has broken.  You become dizzy.  You have mild pelvic cramps, pelvic pressure, or nagging pain in your abdominal area.  You have lower back pain.  You have persistent nausea, vomiting, or diarrhea.   You have an unusual or bad smelling vaginal discharge.  You have pain when you urinate. Get help right away if:  Your water breaks before 37 weeks.  You have regular contractions less than 5 minutes apart before 37 weeks.  You have a fever.  You are leaking fluid from your vagina.  You have spotting or bleeding from your vagina.  You have severe abdominal pain or cramping.  You have rapid weight loss or weight gain.  You have   shortness of breath with chest pain.  You notice sudden or extreme swelling of your face, hands, ankles, feet, or legs.  Your baby makes fewer than 10 movements in 2 hours.  You have severe headaches that do not go away when you take medicine.  You have vision changes. Summary  The third trimester is from week 28 through week 40, months 7 through 9. The third trimester is a time when the unborn baby (fetus) is growing rapidly.  During the third trimester, your discomfort may increase as you and your baby continue to gain weight. You may have abdominal, leg, and back pain, sleeping problems, and an increased need to urinate.  During the third trimester your breasts will keep growing and they will continue to become tender. A yellow fluid (colostrum) may leak from your breasts. This is the first milk you are producing for your baby.  False labor is a condition in which you feel small, irregular tightenings of the muscles in the womb (contractions) that eventually go away. These are called Braxton Hicks contractions. Contractions may last for hours, days, or even weeks before true labor sets in.  Signs of labor can include: abdominal cramps; regular contractions that start at 10 minutes apart and become stronger and more frequent with time; watery or bloody mucus discharge that comes from the vagina; increased pelvic pressure and dull back pain; and leaking of amniotic fluid. This information is not intended to replace advice given to you by your health  care provider. Make sure you discuss any questions you have with your health care provider. Document Released: 11/09/2001 Document Revised: 03/08/2019 Document Reviewed: 12/21/2016 Elsevier Patient Education  2020 [employer]  

## 2019-10-02 ENCOUNTER — Encounter: Payer: Self-pay | Admitting: Advanced Practice Midwife

## 2019-10-02 ENCOUNTER — Ambulatory Visit (INDEPENDENT_AMBULATORY_CARE_PROVIDER_SITE_OTHER): Payer: BC Managed Care – PPO | Admitting: Advanced Practice Midwife

## 2019-10-02 ENCOUNTER — Ambulatory Visit (INDEPENDENT_AMBULATORY_CARE_PROVIDER_SITE_OTHER): Payer: BC Managed Care – PPO

## 2019-10-02 ENCOUNTER — Other Ambulatory Visit: Payer: Self-pay

## 2019-10-02 VITALS — BP 126/80 | Wt 268.0 lb

## 2019-10-02 DIAGNOSIS — M549 Dorsalgia, unspecified: Secondary | ICD-10-CM

## 2019-10-02 DIAGNOSIS — Z3689 Encounter for other specified antenatal screening: Secondary | ICD-10-CM

## 2019-10-02 DIAGNOSIS — O10913 Unspecified pre-existing hypertension complicating pregnancy, third trimester: Secondary | ICD-10-CM

## 2019-10-02 DIAGNOSIS — Z3A33 33 weeks gestation of pregnancy: Secondary | ICD-10-CM

## 2019-10-02 DIAGNOSIS — O10919 Unspecified pre-existing hypertension complicating pregnancy, unspecified trimester: Secondary | ICD-10-CM

## 2019-10-02 DIAGNOSIS — O24410 Gestational diabetes mellitus in pregnancy, diet controlled: Secondary | ICD-10-CM | POA: Diagnosis not present

## 2019-10-02 DIAGNOSIS — O0993 Supervision of high risk pregnancy, unspecified, third trimester: Secondary | ICD-10-CM

## 2019-10-02 DIAGNOSIS — O99893 Other specified diseases and conditions complicating puerperium: Secondary | ICD-10-CM | POA: Diagnosis not present

## 2019-10-02 DIAGNOSIS — Z362 Encounter for other antenatal screening follow-up: Secondary | ICD-10-CM | POA: Diagnosis not present

## 2019-10-02 DIAGNOSIS — O99891 Other specified diseases and conditions complicating pregnancy: Secondary | ICD-10-CM

## 2019-10-02 MED ORDER — CYCLOBENZAPRINE HCL 10 MG PO TABS: 10.0000 mg | ORAL_TABLET | Freq: Two times a day (BID) | ORAL | 2 refills | Status: DC | PRN

## 2019-10-02 MED ORDER — GLUCOSE BLOOD VI STRP: ORAL_STRIP | 12 refills | Status: DC

## 2019-10-02 NOTE — Progress Notes (Signed)
NST today. No vb. No lof.  °

## 2019-10-02 NOTE — Progress Notes (Signed)
Routine Prenatal Care Visit  Subjective  Frances Lamb is a 23 y.o. G2P0010 at [redacted]w[redacted]d being seen today for ongoing prenatal care.  She is currently monitored for the following issues for this high-risk pregnancy and has Chronic hypertension affecting pregnancy; Supervision of high-risk pregnancy; Bipolar disease during pregnancy in first trimester (HCC); Obesity, Class III, BMI 40-49.9 (morbid obesity) (HCC); Anxiety state; and Gestational diabetes mellitus (GDM) in third trimester on their problem list.  ----------------------------------------------------------------------------------- Patient reports backache especially when sitting at work for long periods of time. We discussed comfort measures. She will have appointment with [facility] next week and has just been able to pick up glucometer supplies. We did teaching today regarding glucometer use.  Contractions: Not present. Vag. Bleeding: None.  Movement: Present. Leaking Fluid denies.  ----------------------------------------------------------------------------------- The following portions of the patient's history were reviewed and updated as appropriate: allergies, current medications, past family history, past medical history, past social history, past surgical history and problem list. Problem list updated.  Objective  Blood pressure 126/80, weight 268 lb (121.6 kg), last menstrual period 02/03/2019. Pregravid weight 230 lb (104.3 kg) Total Weight Gain 38 lb (17.2 kg) Urinalysis: Urine Protein    Urine Glucose    Fetal Status: Fetal Heart Rate (bpm): 140   Movement: Present      AFI: 9 cm NST: reactive 20 minute tracing, 140 bpm baseline, moderate variability, +accelerations, -decelerations  General:  Alert, oriented and cooperative. Patient is in no acute distress.  Skin: Skin is warm and dry. No rash noted.   Cardiovascular: Normal heart rate noted  Respiratory: Normal respiratory effort, no problems with respiration noted   Abdomen: Soft, gravid, appropriate for gestational age. Pain/Pressure: Absent     Pelvic:  Cervical exam deferred        Extremities: Normal range of motion.     Mental Status: Normal mood and affect. Normal behavior. Normal judgment and thought content.   Assessment   23 y.o. G2P0010 at [redacted]w[redacted]d by  11/17/2019, by Ultrasound presenting for routine prenatal visit  Plan   pregnancy2 Problems (from 02/03/19 to present)    Problem Noted Resolved   Chronic hypertension affecting pregnancy 03/14/2019 by Nadara Mustard, MD No   Overview Signed 08/27/2019 11:41 AM by Conard Novak, MD    [ ]  Aspirin 81 mg daily after 12 weeks; discontinue after 36 weeks [x]  baseline labs with CBC, CMP, urine protein/creatinine ratio [n/a ] no BP meds unless BPs become elevated [ ]  ultrasound for growth at 28[x] , 32[ ] , 36 weeks [ ]  [ ]  Aspirin 81 mg daily after 12 weeks; discontinue after 36 weeks  Current antihypertensives:  metoprolol 25 mg Qdaily   Baseline and surveillance labs (pulled in from [facility], refresh links as needed)  Lab Results  Component Value Date   PLT 275 06/24/2019   CREATININE 0.48 06/24/2019   AST 16 06/24/2019   ALT 15 06/24/2019    Antenatal Testing CHTN - O10.919  Group I  BP < 140/90, no preeclampsia, AGA,  nml AFV, +/- meds    Group II BP > 140/90, on meds, no preeclampsia, AGA, nml AFV  20-28-34-38  20-24-28-32-35-38  32//2 x wk  28//BPP wkly then 32//2 x wk  40 no meds; 39 meds  PRN or 37  Pre-eclampsia  GHTN - O13.9/Preeclampsia without severe features  - O14.00   Preeclampsia with severe features - O14.10  Q 3-4wks  Q 2 wks  28//BPP wkly then 32//2 x wk  Inpatient  37  PRN or 34         Supervision of high-risk pregnancy 03/14/2019 by Gae Dry, MD No   Overview Addendum 09/18/2019 10:06 AM by Rod Can, [place] Prenatal Labs  Dating [place] 6 weeks Blood type: O/Positive/-- (04/16 1418)   Genetic Screen  NIPS: normal XY  Antibody:Negative (04/16 1418)  Anatomic [place]  Rubella: 1.10 (04/16 1418) Varicella: NONIMMUNE  GTT Early:  91              28wk: GDM- [3 hr 2/4 abnml]  RPR: Non Reactive (04/16 1418)   Rhogam O POS HBsAg: Negative (04/16 1418)   TDaP vaccine                  Flu Shot:2019 done; needs fall 2020 HIV: Non Reactive (04/16 1418)   Baby Food    Breast                            GBS:   Contraception  Pap: 2020 NIL  Tob  Encouraged cessation ecigs   CS/VBAC na   Support Person Husband           Bipolar disease during pregnancy in first trimester ([place]) 03/14/2019 by Gae Dry, MD No   Obesity, Class III, BMI 40-49.9 (morbid obesity) ([place]) 03/14/2019 by Gae Dry, MD No   Overview Signed 08/27/2019 11:41 AM by Will Bonnet, MD    BMI >=40 [ ]  early 1h gtt -  [ ]  u/s for dating [ ]   [ ]  nutritional goals [ ]  folic acid 1mg  [ ]  bASA (>12 weeks) [ ]  consider nutrition consult [ ]  consider maternal EKG 1st trimester [ ]  Growth u/s 28 [ ] , 32 [ ] , 36 weeks [ ]  [ ]  NST/AFI weekly 36+ weeks (36[] , 37[] , 38[] , 39[] , 40[] ) [ ]  IOL by 41 weeks (scheduled, prn [] )           Preterm labor symptoms and general obstetric precautions including but not limited to vaginal bleeding, contractions, leaking of fluid and fetal movement were reviewed in detail with the patient.   Return in about 1 week (around 10/09/2019) for afi/nst/rob.  Rod Can, CNM 10/02/2019 2:33 PM

## 2019-10-10 ENCOUNTER — Encounter: Payer: Self-pay | Admitting: *Deleted

## 2019-10-10 ENCOUNTER — Encounter: Payer: Managed Care, Other (non HMO) | Attending: Maternal Newborn | Admitting: *Deleted

## 2019-10-10 ENCOUNTER — Observation Stay
Admission: EM | Admit: 2019-10-10 | Discharge: 2019-10-11 | Disposition: A | Discharge status: Home / Self Care | Payer: Managed Care, Other (non HMO) | Attending: Obstetrics & Gynecology | Admitting: Obstetrics & Gynecology

## 2019-10-10 ENCOUNTER — Other Ambulatory Visit: Payer: Self-pay

## 2019-10-10 VITALS — BP 110/78 | Ht 62.0 in | Wt 271.1 lb

## 2019-10-10 DIAGNOSIS — Z87891 Personal history of nicotine dependence: Secondary | ICD-10-CM | POA: Insufficient documentation

## 2019-10-10 DIAGNOSIS — O26893 Other specified pregnancy related conditions, third trimester: Principal | ICD-10-CM | POA: Insufficient documentation

## 2019-10-10 DIAGNOSIS — O24419 Gestational diabetes mellitus in pregnancy, unspecified control: Secondary | ICD-10-CM | POA: Insufficient documentation

## 2019-10-10 DIAGNOSIS — Z3A00 Weeks of gestation of pregnancy not specified: Secondary | ICD-10-CM | POA: Insufficient documentation

## 2019-10-10 DIAGNOSIS — F419 Anxiety disorder, unspecified: Secondary | ICD-10-CM | POA: Insufficient documentation

## 2019-10-10 DIAGNOSIS — Z833 Family history of diabetes mellitus: Secondary | ICD-10-CM | POA: Insufficient documentation

## 2019-10-10 DIAGNOSIS — O99343 Other mental disorders complicating pregnancy, third trimester: Secondary | ICD-10-CM | POA: Insufficient documentation

## 2019-10-10 DIAGNOSIS — O10913 Unspecified pre-existing hypertension complicating pregnancy, third trimester: Secondary | ICD-10-CM | POA: Insufficient documentation

## 2019-10-10 DIAGNOSIS — Z79899 Other long term (current) drug therapy: Secondary | ICD-10-CM | POA: Insufficient documentation

## 2019-10-10 DIAGNOSIS — M549 Dorsalgia, unspecified: Secondary | ICD-10-CM | POA: Insufficient documentation

## 2019-10-10 DIAGNOSIS — O10919 Unspecified pre-existing hypertension complicating pregnancy, unspecified trimester: Secondary | ICD-10-CM

## 2019-10-10 DIAGNOSIS — Z3A34 34 weeks gestation of pregnancy: Secondary | ICD-10-CM | POA: Insufficient documentation

## 2019-10-10 DIAGNOSIS — R197 Diarrhea, unspecified: Secondary | ICD-10-CM | POA: Insufficient documentation

## 2019-10-10 DIAGNOSIS — O24410 Gestational diabetes mellitus in pregnancy, diet controlled: Secondary | ICD-10-CM

## 2019-10-10 DIAGNOSIS — F319 Bipolar disorder, unspecified: Secondary | ICD-10-CM

## 2019-10-10 DIAGNOSIS — F3181 Bipolar II disorder: Secondary | ICD-10-CM | POA: Insufficient documentation

## 2019-10-10 NOTE — OB Triage Note (Signed)
Pt reports to unit c/o lower abdominal pain that radiates sharp shooting that began around an hour. Pt also states having nausea that began an hour ago as well. Pt states having diarrhea for the past 5 days as well. Pt reports +FM, denies vaginal bleeding, LOF. Will continue to monitor.

## 2019-10-10 NOTE — Patient Instructions (Signed)
Read booklet on Gestational Diabetes Follow Gestational Meal Planning Guidelines Limit fried foods Include 1 serving of protein and 1 serving of carbohydrate with snacks Complete a 3 Day Food Record and bring to next appointment Check blood sugars 4 x day - before breakfast and 2 hrs after every meal and record  Bring blood sugar log to all appointments Purchase urine ketone strips if ordered by MD and check urine ketones every am:  If + increase bedtime snack to 1 protein and 2 carbohydrate servings Walk 20-30 minutes at least 5 x week if permitted by MD

## 2019-10-11 ENCOUNTER — Ambulatory Visit (INDEPENDENT_AMBULATORY_CARE_PROVIDER_SITE_OTHER): Payer: BC Managed Care – PPO | Admitting: Advanced Practice Midwife

## 2019-10-11 ENCOUNTER — Ambulatory Visit (INDEPENDENT_AMBULATORY_CARE_PROVIDER_SITE_OTHER): Payer: BC Managed Care – PPO

## 2019-10-11 ENCOUNTER — Encounter: Payer: Self-pay | Admitting: Advanced Practice Midwife

## 2019-10-11 VITALS — BP 128/88 | Wt 272.0 lb

## 2019-10-11 DIAGNOSIS — O10913 Unspecified pre-existing hypertension complicating pregnancy, third trimester: Secondary | ICD-10-CM | POA: Diagnosis not present

## 2019-10-11 DIAGNOSIS — Z87891 Personal history of nicotine dependence: Secondary | ICD-10-CM | POA: Diagnosis not present

## 2019-10-11 DIAGNOSIS — Z3689 Encounter for other specified antenatal screening: Secondary | ICD-10-CM | POA: Diagnosis not present

## 2019-10-11 DIAGNOSIS — Z79899 Other long term (current) drug therapy: Secondary | ICD-10-CM | POA: Diagnosis not present

## 2019-10-11 DIAGNOSIS — Z3A34 34 weeks gestation of pregnancy: Secondary | ICD-10-CM

## 2019-10-11 DIAGNOSIS — O24419 Gestational diabetes mellitus in pregnancy, unspecified control: Secondary | ICD-10-CM | POA: Diagnosis not present

## 2019-10-11 DIAGNOSIS — O99343 Other mental disorders complicating pregnancy, third trimester: Secondary | ICD-10-CM | POA: Diagnosis not present

## 2019-10-11 DIAGNOSIS — O0993 Supervision of high risk pregnancy, unspecified, third trimester: Secondary | ICD-10-CM

## 2019-10-11 DIAGNOSIS — R109 Unspecified abdominal pain: Secondary | ICD-10-CM | POA: Diagnosis not present

## 2019-10-11 DIAGNOSIS — Z833 Family history of diabetes mellitus: Secondary | ICD-10-CM | POA: Diagnosis not present

## 2019-10-11 DIAGNOSIS — F3181 Bipolar II disorder: Secondary | ICD-10-CM | POA: Diagnosis not present

## 2019-10-11 DIAGNOSIS — R197 Diarrhea, unspecified: Secondary | ICD-10-CM

## 2019-10-11 DIAGNOSIS — M549 Dorsalgia, unspecified: Secondary | ICD-10-CM

## 2019-10-11 DIAGNOSIS — R110 Nausea: Secondary | ICD-10-CM

## 2019-10-11 DIAGNOSIS — O26893 Other specified pregnancy related conditions, third trimester: Secondary | ICD-10-CM | POA: Diagnosis not present

## 2019-10-11 DIAGNOSIS — F419 Anxiety disorder, unspecified: Secondary | ICD-10-CM | POA: Diagnosis not present

## 2019-10-11 LAB — URINALYSIS, COMPLETE (UACMP) WITH MICROSCOPIC
Bilirubin Urine: NEGATIVE
Glucose, UA: NEGATIVE mg/dL
Hgb urine dipstick: NEGATIVE
Ketones, ur: 5 mg/dL — AB
Leukocytes,Ua: NEGATIVE
Nitrite: NEGATIVE
Protein, ur: NEGATIVE mg/dL
Specific Gravity, Urine: 1.02 (ref 1.005–1.030)
pH: 5 (ref 5.0–8.0)

## 2019-10-11 MED ORDER — LACTATED RINGERS IV BOLUS
500.0000 mL | Freq: Once | INTRAVENOUS | Status: AC
  Administered 2019-10-11: 500 mL via INTRAVENOUS

## 2019-10-11 MED ORDER — ACETAMINOPHEN 500 MG PO TABS
1000.0000 mg | ORAL_TABLET | Freq: Once | ORAL | Status: AC
  Administered 2019-10-11: 1000 mg via ORAL
  Filled 2019-10-11: qty 2

## 2019-10-11 MED ORDER — LOPERAMIDE HCL 2 MG PO CAPS
4.0000 mg | ORAL_CAPSULE | ORAL | Status: DC | PRN
  Administered 2019-10-11: 4 mg via ORAL
  Filled 2019-10-11 (×3): qty 2

## 2019-10-11 MED ORDER — LACTATED RINGERS IV SOLN: INTRAVENOUS | Status: DC

## 2019-10-11 NOTE — Discharge Summary (Signed)
See Final Progress Note 10/11/2019. 

## 2019-10-11 NOTE — Progress Notes (Signed)
Diabetes Self-Management Education  Visit Type: First/Initial  Appt. Start Time: 1535 Appt. End Time: 1715  10/10/2019  [name], identified by name and date of birth, is a 23 y.o. female with a diagnosis of Diabetes: Gestational Diabetes.   ASSESSMENT  Blood pressure 110/78, height 5\' 2"  (1.575 m), weight 271 lb 1.6 oz (123 kg), last menstrual period 02/03/2019., estimated date of delivery 11/17/2019 Body mass index is 49.58 kg/m.  Diabetes Self-Management Education - 10/10/19 1756      Visit Information   Visit Type  First/Initial      Initial Visit   Diabetes Type  Gestational Diabetes    Are you currently following a meal plan?  No    Are you taking your medications as prescribed?  Yes    Date Diagnosed  2-3 weeks      Health Coping   How would you rate your overall health?  Good      Psychosocial Assessment   Patient Belief/Attitude about Diabetes  Other (comment)   "not thrilled"   Self-care barriers  None    Self-management support  Doctor's office;Family    Patient Concerns  Nutrition/Meal planning;Weight Control    Special Needs  None    Preferred Learning Style  Visual;Hands on    Learning Readiness  Ready    How often do you need to have someone help you when you read instructions, pamphlets, or other written materials from your doctor or pharmacy?  1 - Never    What is the last grade level you completed in school?  some college      Pre-Education Assessment   Patient understands the diabetes disease and treatment process.  Needs Review    Patient understands incorporating nutritional management into lifestyle.  Needs Instruction    Patient undertands incorporating physical activity into lifestyle.  Needs Instruction    Patient understands using medications safely.  Needs Instruction    Patient understands monitoring blood glucose, interpreting and using results  Needs Review    Patient understands prevention, detection, and treatment of acute  complications.  Needs Instruction    Patient understands prevention, detection, and treatment of chronic complications.  Needs Instruction    Patient understands how to develop strategies to address psychosocial issues.  Needs Instruction    Patient understands how to develop strategies to promote health/change behavior.  Needs Instruction      Complications   How often do you check your blood sugar?  3-4 times/day    Fasting Blood glucose range (mg/dL)  13/11/20   FBG's 99-833 mg/dL   Postprandial Blood glucose range (mg/dL)  82-505   pp's 115-179 mg/dL   Have you had a dilated eye exam in the past 12 months?  Yes    Have you had a dental exam in the past 12 months?  No    Are you checking your feet?  Yes    How many days per week are you checking your feet?  2      Dietary Intake   Breakfast  works 3rd shift - at 6:30 am eats scrambled egg    Lunch  5:00 pm protein shake, 4 piece nugget    Dinner  7-8:00 pm beef, pork, chicken, bread, potatoes, peas, beans, corn, rice, pasta, green beans - can't tolerate salads during pregnancy    Snack (evening)  1-2 snacks/day - protein packs, sufgar free jello, applesauce or fruit  - grapes, apples, bananas, strawberries    Beverage(s)  water, diet  soda      Exercise   Exercise Type  ADL's      Patient Education   Previous Diabetes Education  No    Disease state   Definition of diabetes, type 1 and 2, and the diagnosis of diabetes;Factors that contribute to the development of diabetes    Nutrition management   Role of diet in the treatment of diabetes and the relationship between the three main macronutrients and blood glucose level;Food label reading, portion sizes and measuring food.;Reviewed blood glucose goals for pre and post meals and how to evaluate the patients' food intake on their blood glucose level.    Physical activity and exercise   Role of exercise on diabetes management, blood pressure control and cardiac health.     Medications  Other (comment)   Limited use of oral medications during pregnancy and possibly insulin   Monitoring  Purpose and frequency of SMBG.;Taught/discussed recording of test results and interpretation of SMBG.;Identified appropriate SMBG and/or A1C goals.;Ketone testing, when, how.    Chronic complications  Relationship between chronic complications and blood glucose control    Psychosocial adjustment  Role of stress on diabetes;Identified and addressed patients feelings and concerns about diabetes    Preconception care  Pregnancy and GDM  Role of pre-pregnancy blood glucose control on the development of the fetus;Reviewed with patient blood glucose goals with pregnancy;Role of family planning for patients with diabetes      Individualized Goals (developed by patient)   Reducing Risk Lose weight     Outcomes   Expected Outcomes  Demonstrated interest in learning. Expect positive outcomes       Individualized Plan for Diabetes Self-Management Training:   Learning Objective:  Patient will have a greater understanding of diabetes self-management. Patient education plan is to attend individual and/or group sessions per assessed needs and concerns. Limit  Plan:   Patient Instructions  Read booklet on Gestational Diabetes Follow Gestational Meal Planning Guidelines Limit fried foods Include 1 serving of protein and 1 serving of carbohydrate with snacks Complete a 3 Day Food Record and bring to next appointment Check blood sugars 4 x day - before breakfast and 2 hrs after every meal and record  Bring blood sugar log to all appointments Purchase urine ketone strips if ordered by MD and check urine ketones every am:  If + increase bedtime snack to 1 protein and 2 carbohydrate servings Walk 20-30 minutes at least 5 x week if permitted by MD  Expected Outcomes:  Demonstrated interest in learning. Expect positive outcomes  Education material provided:  Gestational Booklet Gestational  Meal Planning Guidelines Simple Meal Plan Viewed Gestational Diabetes Video 3 Day Food Record Goals for a Healthy Pregnancy  If problems or questions, patient to contact team via:  Frances Lamb, [place], [place], CDE [phone]  Future DSME appointment:  October 15, 2019 with the dietitian

## 2019-10-11 NOTE — Discharge Instructions (Signed)
Please return to hospital with any of the following symptoms:  -contractions at least 5 minutes apart -decreased fetal movement  -vaginal bleeding  -leakage of fluid  -"rock hard" abdomen that does not relax

## 2019-10-11 NOTE — Progress Notes (Signed)
Routine Prenatal Care Visit  Subjective  Frances Lamb is a 23 y.o. G2P0010 at [redacted]w[redacted]d being seen today for ongoing prenatal care.  She is currently monitored for the following issues for this high-risk pregnancy and has Chronic hypertension affecting pregnancy; Supervision of high-risk pregnancy; Bipolar disease during pregnancy in first trimester ([place]); Obesity, Class III, BMI 40-49.9 (morbid obesity) ([place]); Anxiety state; Gestational diabetes mellitus (GDM) in third trimester; and Indication for care in labor and delivery, antepartum on their problem list.  ----------------------------------------------------------------------------------- Patient reports feeling a little better than when she was in the hospital last night. She still has a little bit of back pain and lower abdominal pain. She denies contractions. She has so far been inconsistently checking her blood sugar for the past 4 days. She works 3rd shift and wakes up usually about 4 in the afternoon. Fasting levels have been elevated in the 110s. After meals levels have been normal. She has been taking her BP meds and denies headaches, visual changes or epigastric pain.   Contractions: Not present. Vag. Bleeding: None.  Movement: Present. Leaking Fluid denies.  ----------------------------------------------------------------------------------- The following portions of the patient's history were reviewed and updated as appropriate: allergies, current medications, past family history, past medical history, past social history, past surgical history and problem list. Problem list updated.  Objective  Blood pressure 128/88, weight 272 lb (123.4 kg), last menstrual period 02/03/2019. Pregravid weight 230 lb (104.3 kg) Total Weight Gain 42 lb (19.1 kg) Urinalysis: Urine Protein    Urine Glucose    Fetal Status: Fetal Heart Rate (bpm): 140   Movement: Present  Presentation: Vertex   NST reactive 20 minute tracing, 140 bpm baseline, moderate  variability, +accelerations, -decelerations AFI: 15.3 General:  Alert, oriented and cooperative. Patient is in no acute distress.  Skin: Skin is warm and dry. No rash noted.   Cardiovascular: Normal heart rate noted  Respiratory: Normal respiratory effort, no problems with respiration noted  Abdomen: Soft, gravid, appropriate for gestational age. Pain/Pressure: Absent     Pelvic:  Cervical exam deferred      checked earlier today at the hospital- closed  Extremities: Normal range of motion.  Edema: Trace  Mental Status: Normal mood and affect. Normal behavior. Normal judgment and thought content.   Assessment   23 y.o. G2P0010 at [redacted]w[redacted]d by  11/17/2019, by Ultrasound presenting for routine prenatal visit  Plan   pregnancy2 Problems (from 02/03/19 to present)    Problem Noted Resolved   Chronic hypertension affecting pregnancy 03/14/2019 by Gae Dry, MD No   Overview Signed 08/27/2019 11:41 AM by Will Bonnet, MD    [ ]  Aspirin 81 mg daily after 12 weeks; discontinue after 36 weeks [x]  baseline labs with CBC, CMP, urine protein/creatinine ratio [n/a ] no BP meds unless BPs become elevated [ ]  ultrasound for growth at 28[x] , 32[ ] , 36 weeks [ ]  [ ]  Aspirin 81 mg daily after 12 weeks; discontinue after 36 weeks  Current antihypertensives:  metoprolol 25 mg Qdaily   Baseline and surveillance labs (pulled in from [facility], refresh links as needed)  Lab Results  Component Value Date   PLT 275 06/24/2019   CREATININE 0.48 06/24/2019   AST 16 06/24/2019   ALT 15 06/24/2019    Antenatal Testing CHTN - O10.919  Group I  BP < 140/90, no preeclampsia, AGA,  nml AFV, +/- meds    Group II BP > 140/90, on meds, no preeclampsia, AGA, nml AFV  20-28-34-38  20-24-28-32-35-38  32//2 x wk  28//BPP wkly then 32//2 x wk  40 no meds; 39 meds  PRN or 37  Pre-eclampsia  GHTN - O13.9/Preeclampsia without severe features  - O14.00   Preeclampsia with severe features - O14.10  Q  3-4wks  Q 2 wks  28//BPP wkly then 32//2 x wk  Inpatient  37  PRN or 34         Supervision of high-risk pregnancy 03/14/2019 by Nadara Mustard, MD No   Overview Addendum 09/18/2019 10:06 AM by Tresea Mall, CNM    [facility] Prenatal Labs  Dating [place] 6 weeks Blood type: O/Positive/-- (04/16 1418)   Genetic Screen  NIPS: normal XY Antibody:Negative (04/16 1418)  Anatomic [place]  Rubella: 1.10 (04/16 1418) Varicella: NONIMMUNE  GTT Early:  91              28wk: GDM- [3 hr 2/4 abnml]  RPR: Non Reactive (04/16 1418)   Rhogam O POS HBsAg: Negative (04/16 1418)   TDaP vaccine                  Flu Shot:2019 done; needs fall 2020 HIV: Non Reactive (04/16 1418)   Baby Food    Breast                            GBS:   Contraception  Pap: 2020 NIL  Tob  Encouraged cessation ecigs   CS/VBAC na   Support Person Husband           Bipolar disease during pregnancy in first trimester (HCC) 03/14/2019 by Nadara Mustard, MD No   Obesity, Class III, BMI 40-49.9 (morbid obesity) (HCC) 03/14/2019 by Nadara Mustard, MD No   Overview Signed 08/27/2019 11:41 AM by Conard Novak, MD    BMI >=40 [ ]  early 1h gtt -  [ ]  u/s for dating [ ]   [ ]  nutritional goals [ ]  folic acid 1mg  [ ]  bASA (>12 weeks) [ ]  consider nutrition consult [ ]  consider maternal EKG 1st trimester [ ]  Growth u/s 28 [ ] , 32 [ ] , 36 weeks [ ]  [ ]  NST/AFI weekly 36+ weeks (36[] , 37[] , 38[] , 39[] , 40[] ) [ ]  IOL by 41 weeks (scheduled, prn [] )           Preterm labor symptoms and general obstetric precautions including but not limited to vaginal bleeding, contractions, leaking of fluid and fetal movement were reviewed in detail with the patient.   Return in about 4 days (around 10/15/2019) for nst/rob on mondays and afi/nst/rob on thursdays.  , CNM 10/11/2019 3:39 PM

## 2019-10-11 NOTE — Progress Notes (Signed)
No vb. No lof.  

## 2019-10-11 NOTE — OB Triage Note (Signed)
Discharge instructions reviewed, all questions answered, pt verbalized understanding. Follow-up care reviewed. Pt discharged ambulatory with significant other.

## 2019-10-11 NOTE — Final Progress Note (Signed)
Physician Final Progress Note  Patient ID: Frances Lamb MRN: [id number] DOB/AGE: 07-26-1996 23 y.o.  Admit date: 10/10/2019 Admitting provider: Gae Dry, MD Discharge date: 10/11/2019   Admission Diagnoses: Abdominal pain during pregnancy, nausea, diarrhea  Discharge Diagnoses: Back pain, diarrhea  History of Present Illness: The patient is a 23 y.o. female G2P0010 at 55w5dwho presents for intermittent abdominal pain that began around 7pm and radiates into her vaginal area. She describes the pain as sharp and shooting and rates it 8/10. She also had nausea that started around the same time, but she took some Zofran at home which has helped. She reports that she has had loose stools for about 5 days. No vaginal bleeding or loss of fluid. Baby has been moving well.  Review of Systems: Review of systems negative unless otherwise noted in HPI.   Past Medical History:  Diagnosis Date  . Anxiety   . Bipolar 2 disorder ([place]   . Depression   . Gestational diabetes   . Hypertension   . Migraine   . Miscarriage     Past Surgical History:  Procedure Laterality Date  . TONSILLECTOMY AND ADENOIDECTOMY      No current facility-administered medications on file prior to encounter.    Current Outpatient Medications on File Prior to Encounter  Medication Sig Dispense Refill  . Blood Glucose Monitoring Suppl (ONETOUCH VERIO FLEX SYSTEM) w/Device KIT Check blood sugar 4 times daily; fasting and 2 hours after each meal 1 kit 0  . cyclobenzaprine (FLEXERIL) 10 MG tablet Take 1 tablet (10 mg total) by mouth 2 (two) times daily as needed for muscle spasms. 30 tablet 2  . DULoxetine (CYMBALTA) 60 MG capsule Take 60 mg by mouth daily.    .Marland Kitchenglucose blood test strip Use as instructed 100 each 12  . metoprolol succinate (TOPROL-XL) 25 MG 24 hr tablet Take 1 tablet (25 mg total) by mouth daily. 30 tablet 2  . ondansetron (ZOFRAN ODT) 4 MG disintegrating tablet Take 1 tablet (4 mg total) by  mouth every 6 (six) hours as needed for nausea. 20 tablet 0  . Prenatal Vit-Fe Fumarate-FA (PRENATAL COMPLETE PO) Take 1 tablet by mouth daily.    . hydrOXYzine (ATARAX/VISTARIL) 50 MG tablet TK 1 TO 2 TS PO HS      No Known Allergies  Social History   Socioeconomic History  . Marital status: Married    Spouse name: Not on file  . Number of children: Not on file  . Years of education: Not on file  . Highest education level: Not on file  Occupational History  . Not on file  Social Needs  . Financial resource strain: Not on file  . Food insecurity    Worry: Not on file    Inability: Not on file  . Transportation needs    Medical: Not on file    Non-medical: Not on file  Tobacco Use  . Smoking status: Former Smoker    Years: 2.00    Types: E-cigarettes    Quit date: 11/29/2012    Years since quitting: 6.8  . Smokeless tobacco: Current User  . Tobacco comment: current vaping weekly  Substance and Sexual Activity  . Alcohol use: Not Currently  . Drug use: Never  . Sexual activity: Yes    Birth control/protection: Pill  Lifestyle  . Physical activity    Days per week: Not on file    Minutes per session: Not on file  . Stress: Not  on file  Relationships  . Social Herbalist on phone: Not on file    Gets together: Not on file    Attends religious service: Not on file    Active member of club or organization: Not on file    Attends meetings of clubs or organizations: Not on file    Relationship status: Not on file  . Intimate partner violence    Fear of current or ex partner: Not on file    Emotionally abused: Not on file    Physically abused: Not on file    Forced sexual activity: Not on file  Other Topics Concern  . Not on file  Social History Narrative  . Not on file    Family history:  Family History  Problem Relation Age of Onset  . Diabetes Mother   . Diabetes Maternal Grandfather      Physical Exam: BP 118/71   Pulse (!) 104   Temp 98.1  F (36.7 C) (Oral)   Resp 20   LMP 02/03/2019   Gen: NAD Pulm: No increased work of breathing Pelvic: 0/thick/-3 Ext: No signs of DVT Psych: Mood appropriate, affect full  NST Baseline: 135 bpm Variability: moderate Accelerations: present Decelerations: absent Tocometry: uterine irritability The patient was monitored for 30+ minutes, fetal heart rate tracing was deemed reactive.  Hospital Course: Lab results on UA showed mild dehydration; IV fluid bolus and continuous fluids given. Patient reported improvement in abdominal pain and no more shooting pains in her vagina. Had a loose stool here and received Immodium. Having some lower back pain, but otherwise feeling better and desires to discharge home. Comfort measures advised for back pain.  Procedures: NST  Discharge Condition: good  Disposition: Discharge disposition: 01-Home or Self Care       Diet: Regular diet  Discharge Activity: Activity as tolerated  Discharge Instructions    Discharge activity:  No Restrictions   Complete by: As directed    Discharge diet:  No restrictions   Complete by: As directed    Fetal Kick Count:  Lie on our left side for one hour after a meal, and count the number of times your baby kicks.  If it is less than 5 times, get up, move around and drink some juice.  Repeat the test 30 minutes later.  If it is still less than 5 kicks in an hour, notify your doctor.   Complete by: As directed    LABOR:  When conractions begin, you should start to time them from the beginning of one contraction to the beginning  of the next.  When contractions are 5 - 10 minutes apart or less and have been regular for at least an hour, you should call your health care provider.   Complete by: As directed    No sexual activity restrictions   Complete by: As directed    Notify physician for bleeding from the vagina   Complete by: As directed    Notify physician for blurring of vision or spots before the eyes    Complete by: As directed    Notify physician for chills or fever   Complete by: As directed    Notify physician for fainting spells, "black outs" or loss of consciousness   Complete by: As directed    Notify physician for increase in vaginal discharge   Complete by: As directed    Notify physician for leaking of fluid   Complete by: As directed  Notify physician for pain or burning when urinating   Complete by: As directed    Notify physician for pelvic pressure (sudden increase)   Complete by: As directed    Notify physician for severe or continued nausea or vomiting   Complete by: As directed    Notify physician for sudden gushing of fluid from the vagina (with or without continued leaking)   Complete by: As directed    Notify physician for sudden, constant, or occasional abdominal pain   Complete by: As directed    Notify physician if baby moving less than usual   Complete by: As directed      Allergies as of 10/11/2019   No Known Allergies     Medication List    TAKE these medications   cyclobenzaprine 10 MG tablet Commonly known as: FLEXERIL Take 1 tablet (10 mg total) by mouth 2 (two) times daily as needed for muscle spasms.   DULoxetine 60 MG capsule Commonly known as: CYMBALTA Take 60 mg by mouth daily.   glucose blood test strip Use as instructed   hydrOXYzine 50 MG tablet Commonly known as: ATARAX/VISTARIL TK 1 TO 2 TS PO HS   metoprolol succinate 25 MG 24 hr tablet Commonly known as: TOPROL-XL Take 1 tablet (25 mg total) by mouth daily.   ondansetron 4 MG disintegrating tablet Commonly known as: Zofran ODT Take 1 tablet (4 mg total) by mouth every 6 (six) hours as needed for nausea.   OneTouch Verio Flex System w/Device Kit Check blood sugar 4 times daily; fasting and 2 hours after each meal   PRENATAL COMPLETE PO Take 1 tablet by mouth daily.      Keep ROB appointment today.  Signed: Rexene Agent, CNM  10/11/2019, 2:45 AM

## 2019-10-14 ENCOUNTER — Observation Stay
Admission: EM | Admit: 2019-10-14 | Discharge: 2019-10-14 | Disposition: A | Discharge status: Home / Self Care | Payer: Managed Care, Other (non HMO) | Attending: Obstetrics and Gynecology | Admitting: Obstetrics and Gynecology

## 2019-10-14 ENCOUNTER — Other Ambulatory Visit: Payer: Self-pay

## 2019-10-14 DIAGNOSIS — F419 Anxiety disorder, unspecified: Secondary | ICD-10-CM | POA: Diagnosis not present

## 2019-10-14 DIAGNOSIS — Z79899 Other long term (current) drug therapy: Secondary | ICD-10-CM | POA: Insufficient documentation

## 2019-10-14 DIAGNOSIS — F319 Bipolar disorder, unspecified: Secondary | ICD-10-CM | POA: Diagnosis not present

## 2019-10-14 DIAGNOSIS — O24419 Gestational diabetes mellitus in pregnancy, unspecified control: Secondary | ICD-10-CM | POA: Diagnosis not present

## 2019-10-14 DIAGNOSIS — Z87891 Personal history of nicotine dependence: Secondary | ICD-10-CM | POA: Diagnosis not present

## 2019-10-14 DIAGNOSIS — O99343 Other mental disorders complicating pregnancy, third trimester: Secondary | ICD-10-CM | POA: Diagnosis not present

## 2019-10-14 DIAGNOSIS — Z3A35 35 weeks gestation of pregnancy: Secondary | ICD-10-CM | POA: Insufficient documentation

## 2019-10-14 DIAGNOSIS — O163 Unspecified maternal hypertension, third trimester: Secondary | ICD-10-CM | POA: Diagnosis not present

## 2019-10-14 DIAGNOSIS — O0993 Supervision of high risk pregnancy, unspecified, third trimester: Secondary | ICD-10-CM

## 2019-10-14 DIAGNOSIS — Z043 Encounter for examination and observation following other accident: Principal | ICD-10-CM | POA: Insufficient documentation

## 2019-10-14 DIAGNOSIS — O99341 Other mental disorders complicating pregnancy, first trimester: Secondary | ICD-10-CM

## 2019-10-14 DIAGNOSIS — O10919 Unspecified pre-existing hypertension complicating pregnancy, unspecified trimester: Secondary | ICD-10-CM

## 2019-10-14 NOTE — Final Progress Note (Signed)
Physician Final Progress Note  Patient ID: Frances Lamb MRN: [id number] DOB/AGE: [birth year] 23 y.o.  Admit date: 10/14/2019 Admitting provider: Will Bonnet, MD Discharge date: 10/14/2019   Admission Diagnoses:  1) ground level fall in pregnancy 2) intrauterine pregnancy at [redacted]w[redacted]d  Discharge Diagnoses:  1) ground level fall in pregnancy 2) intrauterine pregnancy at [redacted]w[redacted]d History of Present Illness: The patient is a [age].o. female G2P0010 at 3571w1do presents for a ground-level fall about 1 hour prior to presentation. She was changing clothes at work and slipped and landed on her bottom.  She did not hit her abdomen.  She notes +FM, no LOF, no vaginal bleeding, and no contractions.  She was instructed to report to L&D after calling the after-hours line.  She initially felt decreased fetal movement, but is feeling the baby move "constantly" now.    Past Medical History:  Diagnosis Date  . Anxiety   . [place] 2 [place]   . Gestational diabetes   . Hypertension   . Migraine   . Miscarriage     Past Surgical History:  Procedure Laterality Date  . TONSILLECTOMY AND ADENOIDECTOMY      No current facility-administered medications on file prior to encounter.    Current Outpatient Medications on File Prior to Encounter  Medication Sig Dispense Refill  . Blood Glucose Monitoring Suppl (ONETOUCH VERIO FLEX SYSTEM) w/Device KIT Check blood sugar 4 times daily; fasting and 2 hours after each meal 1 kit 0  . cyclobenzaprine (FLEXERIL) 10 MG tablet Take 1 tablet (10 mg total) by mouth 2 (two) times daily as needed for muscle spasms. 30 tablet 2  . DULoxetine (CYMBALTA) 60 MG capsule Take 60 mg by mouth daily.    . gMarland Kitchenucose blood test strip Use as instructed 100 each 12  . hydrOXYzine (ATARAX/VISTARIL) 50 MG tablet TK 1 TO 2 TS PO HS    . metoprolol succinate (TOPROL-XL) 25 MG 24 hr tablet Take 1 tablet (25 mg total) by mouth daily. 30 tablet 2  . ondansetron  (ZOFRAN ODT) 4 MG disintegrating tablet Take 1 tablet (4 mg total) by mouth every 6 (six) hours as needed for nausea. 20 tablet 0  . Prenatal Vit-Fe Fumarate-FA (PRENATAL COMPLETE PO) Take 1 tablet by mouth daily.      No Known Allergies  Social History   Socioeconomic History  . Marital status: Married    Spouse name: Not on file  . Number of children: Not on file  . Years of education: Not on file  . Highest education level: Not on file  Occupational History  . Not on file  Social Needs  . Financial resource strain: Not on file  . Food insecurity    Worry: Not on file    Inability: Not on file  . Transportation needs    Medical: Not on file    Non-medical: Not on file  Tobacco Use  . Smoking status: Former Smoker    Years: 2.00    Types: E-cigarettes    Quit date: 11/29/2012    Years since quitting: 6.8  . Smokeless tobacco: Current User  . Tobacco comment: current vaping weekly  Substance and Sexual Activity  . Alcohol use: Not Currently  . Drug use: Never  . Sexual activity: Yes    Birth control/protection: Pill  Lifestyle  . Physical activity    Days per week: Not on file    Minutes per session: Not on  file  . Stress: Not on file  Relationships  . Social Herbalist on phone: Not on file    Gets together: Not on file    Attends religious service: Not on file    Active member of club or organization: Not on file    Attends meetings of clubs or organizations: Not on file    Relationship status: Not on file  . Intimate partner violence    Fear of current or ex partner: Not on file    Emotionally abused: Not on file    Physically abused: Not on file    Forced sexual activity: Not on file  Other Topics Concern  . Not on file  Social History Narrative  . Not on file    Family History  Problem Relation Age of Onset  . Diabetes Mother   . Diabetes Maternal Grandfather      Review of Systems  Constitutional: Negative.   HENT: Negative.   Eyes:  Negative.   Respiratory: Negative.   Cardiovascular: Negative.   Gastrointestinal: Negative.   Genitourinary: Negative.   Musculoskeletal: Positive for back pain, falls (incidental), joint pain and myalgias. Negative for neck pain.  Skin: Negative.   Neurological: Negative.   Psychiatric/Behavioral: Negative.      Physical Exam: BP 125/76   Pulse 87   Temp 98.7 F (37.1 C) (Oral)   Resp 18   Ht _0  (1.575 m)   Wt 122.9 kg   LMP 02/03/2019   BMI 49.57 kg/m   Physical Exam Constitutional:      General: She is not in acute distress.    Appearance: Normal appearance.  HENT:     Head: Normocephalic and atraumatic.  Eyes:     General: No scleral icterus.    Conjunctiva/sclera: Conjunctivae normal.  Neurological:     General: No focal deficit present.     Mental Status: She is alert and oriented to person, place, and time.     Cranial Nerves: No cranial nerve deficit.  Psychiatric:        Mood and Affect: Mood normal.        Behavior: Behavior normal.        Judgment: Judgment normal.     Consults: None  Significant Findings/ Diagnostic Studies: none  Procedures:  NST: Baseline FHR: 125 beats/min Variability: moderate Accelerations: present Decelerations: absent Tocometry: absent  Interpretation:  INDICATIONS: fall RESULTS:  A NST procedure was performed with FHR monitoring and a normal baseline established, appropriate time of 20-40 minutes of evaluation, and accels >2 seen w 15x15 characteristics.  Results show a REACTIVE NST.    Hospital Course: The patient was admitted to Labor and Delivery Triage for observation. She had normal vital signs. She had a reactive fetal tracing. She had no concerning symptoms. The patient was reassured. She has close follow up already scheduled and is not scheduled to work in the next couple of days.  She was, therefore, considered to be safe for discharge.  Discharge Condition: stable  Disposition: Discharge disposition:  01-Home or Self Care       Diet: Diabetic diet  Discharge Activity: Activity as tolerated   Allergies as of 10/14/2019   No Known Allergies     Medication List    TAKE these medications   cyclobenzaprine 10 MG tablet Commonly known as: FLEXERIL Take 1 tablet (10 mg total) by mouth 2 (two) times daily as needed for muscle spasms.   DULoxetine 60 MG capsule Commonly  known as: CYMBALTA Take 60 mg by mouth daily.   glucose blood test strip Use as instructed   hydrOXYzine 50 MG tablet Commonly known as: ATARAX/VISTARIL TK 1 TO 2 TS PO HS   metoprolol succinate 25 MG 24 hr tablet Commonly known as: TOPROL-XL Take 1 tablet (25 mg total) by mouth daily.   ondansetron 4 MG disintegrating tablet Commonly known as: Zofran ODT Take 1 tablet (4 mg total) by mouth every 6 (six) hours as needed for nausea.   OneTouch Verio Flex System w/Device Kit Check blood sugar 4 times daily; fasting and 2 hours after each meal   PRENATAL COMPLETE PO Take 1 tablet by mouth daily.        Total time spent taking care of this patient: 30 minutes  Signed: Prentice Docker, MD  10/14/2019, 9:32 PM

## 2019-10-14 NOTE — OB Triage Note (Signed)
Pt arrived to unit post fall at work around 2000 on 10/14/2019. Patient states she was in the bathroom with socks on and slipped and landed directly on her bottom/perineum. No LOF or spotting. Pt notes mild cramping and soreness in both hips and perineum. +FM. Continue to assess.

## 2019-10-14 NOTE — Discharge Summary (Signed)
See Final Progress Note 

## 2019-10-14 NOTE — Discharge Instructions (Signed)

## 2019-10-15 ENCOUNTER — Encounter: Payer: Self-pay | Admitting: Dietician

## 2019-10-15 ENCOUNTER — Encounter: Payer: Managed Care, Other (non HMO) | Admitting: Dietician

## 2019-10-15 VITALS — BP 126/90 | Ht 62.0 in | Wt 273.8 lb

## 2019-10-15 DIAGNOSIS — O24419 Gestational diabetes mellitus in pregnancy, unspecified control: Secondary | ICD-10-CM | POA: Diagnosis not present

## 2019-10-15 DIAGNOSIS — O24410 Gestational diabetes mellitus in pregnancy, diet controlled: Secondary | ICD-10-CM

## 2019-10-15 NOTE — Progress Notes (Signed)
.   Patient's BG record indicates fasting BGs ranging 88-128 (taken in afternoon after sleeping; 128 was not a true fasting result), and post-meal BGs ranging 106-160. Marland Kitchen Patient's food diary indicates varying carbohydrate intake with several high carb meals and some low in carbs; meal times vary due to third shift work + second part time job  . Provided 1800kcal meal plan, and discussed balanced meal options, importance of eating at regular intervals and preparing for incidental delayed meals by keeping snacks on hand. Reviewed goals for appropriate carb intake. . Instructed patient on food safety, including avoidance of Listeriosis, and limiting mercury from fish. . Discussed importance of maintaining healthy lifestyle habits to reduce risk of Type 2 DM as well as Gestational DM with any future pregnancies. . Advised patient to use any remaining testing supplies to test some BGs after delivery, and to have BG tested ideally annually, as well as prior to attempting future pregnancies.

## 2019-10-16 ENCOUNTER — Encounter: Payer: Self-pay | Admitting: Obstetrics and Gynecology

## 2019-10-16 ENCOUNTER — Other Ambulatory Visit: Payer: Self-pay

## 2019-10-16 ENCOUNTER — Ambulatory Visit (INDEPENDENT_AMBULATORY_CARE_PROVIDER_SITE_OTHER): Payer: BC Managed Care – PPO | Admitting: Obstetrics and Gynecology

## 2019-10-16 VITALS — BP 132/88 | Wt 275.0 lb

## 2019-10-16 DIAGNOSIS — O24415 Gestational diabetes mellitus in pregnancy, controlled by oral hypoglycemic drugs: Secondary | ICD-10-CM

## 2019-10-16 DIAGNOSIS — O0993 Supervision of high risk pregnancy, unspecified, third trimester: Secondary | ICD-10-CM

## 2019-10-16 DIAGNOSIS — O99343 Other mental disorders complicating pregnancy, third trimester: Secondary | ICD-10-CM

## 2019-10-16 DIAGNOSIS — Z3A35 35 weeks gestation of pregnancy: Secondary | ICD-10-CM

## 2019-10-16 DIAGNOSIS — O10913 Unspecified pre-existing hypertension complicating pregnancy, third trimester: Secondary | ICD-10-CM

## 2019-10-16 DIAGNOSIS — F319 Bipolar disorder, unspecified: Secondary | ICD-10-CM

## 2019-10-16 DIAGNOSIS — O10919 Unspecified pre-existing hypertension complicating pregnancy, unspecified trimester: Secondary | ICD-10-CM

## 2019-10-16 MED ORDER — HYDROXYZINE HCL 50 MG PO TABS: 50.0000 mg | ORAL_TABLET | Freq: Four times a day (QID) | ORAL | 4 refills | Status: DC | PRN

## 2019-10-16 MED ORDER — METFORMIN HCL 500 MG PO TABS: 500.0000 mg | ORAL_TABLET | Freq: Two times a day (BID) | ORAL | 5 refills | Status: DC

## 2019-10-16 NOTE — Addendum Note (Signed)
Addended by: Prentice Docker D on: 10/16/2019 11:53 AM   Modules accepted: Orders

## 2019-10-16 NOTE — Progress Notes (Signed)
Routine Prenatal Care Visit  Subjective  Frances Lamb is a 23 y.o. G2P0010 at [redacted]w[redacted]d being seen today for ongoing prenatal care.  She is currently monitored for the following issues for this high-risk pregnancy and has Chronic hypertension affecting pregnancy; Supervision of high-risk pregnancy; Bipolar disease during pregnancy in first trimester (HCC); Obesity, Class III, BMI 40-49.9 (morbid obesity) (HCC); Anxiety state; Gestational diabetes mellitus in pregnancy; Indication for care in labor and delivery, antepartum; and Labor and delivery, indication for care on their problem list.  ----------------------------------------------------------------------------------- Patient reports no complaints.   Contractions: Not present. Vag. Bleeding: None.  Movement: Present. Leaking Fluid denies.  GDM: erratic schedule and diet, and not complete log.  Some values elevated. Some normal. Appears to improving overall, but not in control at this point. Discussed treatment options. Given her hectic lifestyle, will try metformin first. Discussed risks/benefits/side effects ----------------------------------------------------------------------------------- The following portions of the patient's history were reviewed and updated as appropriate: allergies, current medications, past family history, past medical history, past social history, past surgical history and problem list. Problem list updated.  Objective  Blood pressure 132/88, weight 275 lb (124.7 kg), last menstrual period 02/03/2019. Pregravid weight 230 lb (104.3 kg) Total Weight Gain 45 lb (20.4 kg) Urinalysis: Urine Protein    Urine Glucose    Fetal Status: Fetal Heart Rate (bpm): 150   Movement: Present     General:  Alert, oriented and cooperative. Patient is in no acute distress.  Skin: Skin is warm and dry. No rash noted.   Cardiovascular: Normal heart rate noted  Respiratory: Normal respiratory effort, no problems with respiration noted   Abdomen: Soft, gravid, appropriate for gestational age. Pain/Pressure: Absent     Pelvic:  Cervical exam deferred        Extremities: Normal range of motion.  Edema: None  Mental Status: Normal mood and affect. Normal behavior. Normal judgment and thought content.   NST: Baseline FHR: 150 beats/min Variability: moderate Accelerations: present Decelerations: absent Tocometry: not done  Interpretation:  INDICATIONS: gestational diabetes mellitus, chronic hypertension affecting pregnancy RESULTS:  A NST procedure was performed with FHR monitoring and a normal baseline established, appropriate time of 20-40 minutes of evaluation, and accels >2 seen w 15x15 characteristics.  Results show a REACTIVE NST.    Assessment   23 y.o. G2P0010 at [redacted]w[redacted]d by  11/17/2019, by Ultrasound presenting for routine prenatal visit  Plan   pregnancy2 Problems (from 02/03/19 to present)    Problem Noted Resolved   Gestational diabetes mellitus in pregnancy 09/18/2019 by Tresea Mall, CNM No   Overview Addendum 10/16/2019 11:45 AM by Conard Novak, MD    Current Diabetic Medications:  Metformin 500 mg before bed (plan to increase to 1,000 mg bid) [ ]  Aspirin 81 mg daily after 12 weeks; discontinue after 36 weeks (? A2/B GDM)  Required Referrals for A1GDM or A2GDM: [x]  Diabetes Education and Testing Supplies [x]  Nutrition Cousult  For A2/B GDM or higher classes of DM [ ]  Diabetes Education and Testing Supplies [ ]  Nutrition Counsult [ ]  Fetal ECHO after 22-24 weeks  [ ]  Eye exam for retina evaluation  [ ]  Baseline EKG [ ]  fetal growth every 4 weeks starting at 28 weeks [ ]  Twice weekly NST starting at [redacted] weeks gestation [ ]  Delivery planning contingent on fetal growth, AFI, glycemic control, and other co-morbidities but at least by 39 weeks  Baseline and surveillance labs (pulled in from [facility], refresh links as needed)  Lab Results  Component Value Date   CREATININE 0.44 09/05/2019   AST  19 09/05/2019   ALT 19 09/05/2019   PROTCRRATIO 0.10 09/05/2019   No results found for: HGBA1C  Antenatal Testing Class of DM U/S NST/AFI DELIVERY  Diabetes   A1 - good control - O24.410    A2 - good control - O24.419      A2  - poor control or poor compliance - O24.419, E11.65   (Macrosomia or polyhydramnios) **E11.65 is extra code for poor control**    A2/B - O24.919  and B-C O24.319  Poor control B-C or D-R-F-T - O24.319  or  Type I DM - O24.019  20-38  20-38  20-24-28-32-36   20-24-28-32-35-38//fetal echo  20-24-27-30-33-36-38//fetal echo  40  32//2 x wk  32//2 x wk   32//2 x wk  28//BPP wkly then 32//2 x wk  40  39  PRN   39  PRN         Chronic hypertension affecting pregnancy 03/14/2019 by Nadara MustardHarris, Robert P, MD No   Overview Addendum 10/16/2019 11:45 AM by Conard NovakJackson, Malvina Schadler D, MD    [ ]  Aspirin 81 mg daily after 12 weeks; discontinue after 36 weeks [x]  baseline labs with CBC, CMP, urine protein/creatinine ratio [n/a ] no BP meds unless BPs become elevated [ ]  ultrasound for growth at 28[x] , 32[x] , 36 weeks [ ]  [x]  Aspirin 81 mg daily after 12 weeks; discontinue after 36 weeks  Current antihypertensives:  metoprolol 25 mg Qdaily   Baseline and surveillance labs (pulled in from [facility], refresh links as needed)  Lab Results  Component Value Date   PLT 275 06/24/2019   CREATININE 0.48 06/24/2019   AST 16 06/24/2019   ALT 15 06/24/2019    Antenatal Testing CHTN - O10.919  Group I  BP < 140/90, no preeclampsia, AGA,  nml AFV, +/- meds    Group II BP > 140/90, on meds, no preeclampsia, AGA, nml AFV  20-28-34-38  20-24-28-32-35-38  32//2 x wk  28//BPP wkly then 32//2 x wk  40 no meds; 39 meds  PRN or 37  Pre-eclampsia  GHTN - O13.9/Preeclampsia without severe features  - O14.00   Preeclampsia with severe features - O14.10  Q 3-4wks  Q 2 wks  28//BPP wkly then 32//2 x wk  Inpatient  37  PRN or 34         Supervision of  high-risk pregnancy 03/14/2019 by Nadara MustardHarris, Robert P, MD No   Overview Addendum 09/18/2019 10:06 AM by Tresea MallGledhill, Jane, CNM    [facility] Prenatal Labs  Dating US 6 weeks Blood type: O/Positive/-- (04/16 1418)   Genetic Screen  NIPS: normal XY Antibody:Negative (04/16 1418)  Anatomic US  Rubella: 1.10 (04/16 1418) Varicella: NONIMMUNE  GTT Early:  91              28wk: GDM- [3 hr 2/4 abnml]  RPR: Non Reactive (04/16 1418)   Rhogam O POS HBsAg: Negative (04/16 1418)   TDaP vaccine                  Flu Shot:2019 done; needs fall 2020 HIV: Non Reactive (04/16 1418)   Baby Food    Breast                            GBS:   Contraception  Pap: 2020 NIL  Tob  Encouraged cessation ecigs   CS/VBAC na   Support Person Husband  Bipolar disease during pregnancy in first trimester ([place]) 03/14/2019 by Gae Dry, MD No   Obesity, Class III, BMI 40-49.9 (morbid obesity) ([place]) 03/14/2019 by Gae Dry, MD No   Overview Signed 08/27/2019 11:41 AM by Will Bonnet, MD    BMI >=40 [ ]  early 1h gtt -  [ ]  u/s for dating [ ]   [ ]  nutritional goals [ ]  folic acid 1mg  [ ]  bASA (>12 weeks) [ ]  consider nutrition consult [ ]  consider maternal EKG 1st trimester [ ]  Growth u/s 28 [ ] , 32 [ ] , 36 weeks [ ]  [ ]  NST/AFI weekly 36+ weeks (36[] , 37[] , 38[] , 39[] , 40[] ) [ ]  IOL by 41 weeks (scheduled, prn [] )           Preterm labor symptoms and general obstetric precautions including but not limited to vaginal bleeding, contractions, leaking of fluid and fetal movement were reviewed in detail with the patient. Please refer to After Visit Summary for other counseling recommendations.   -Rx for metformin provided. Instruction on how to use also given.   Return in about 1 week (around 10/23/2019) for Routine Prenatal Appointment with NST (keep 11/20 appt).  Prentice Docker, MD, [facility], [facility] 10/16/2019 11:47 AM

## 2019-10-19 ENCOUNTER — Ambulatory Visit (INDEPENDENT_AMBULATORY_CARE_PROVIDER_SITE_OTHER): Payer: BC Managed Care – PPO | Admitting: Advanced Practice Midwife

## 2019-10-19 ENCOUNTER — Ambulatory Visit (INDEPENDENT_AMBULATORY_CARE_PROVIDER_SITE_OTHER): Payer: BC Managed Care – PPO

## 2019-10-19 ENCOUNTER — Encounter: Payer: Self-pay | Admitting: Advanced Practice Midwife

## 2019-10-19 ENCOUNTER — Other Ambulatory Visit: Payer: Self-pay

## 2019-10-19 VITALS — BP 140/92 | Wt 274.0 lb

## 2019-10-19 DIAGNOSIS — Z3689 Encounter for other specified antenatal screening: Secondary | ICD-10-CM

## 2019-10-19 DIAGNOSIS — O10913 Unspecified pre-existing hypertension complicating pregnancy, third trimester: Secondary | ICD-10-CM | POA: Diagnosis not present

## 2019-10-19 DIAGNOSIS — O10919 Unspecified pre-existing hypertension complicating pregnancy, unspecified trimester: Secondary | ICD-10-CM

## 2019-10-19 DIAGNOSIS — O24415 Gestational diabetes mellitus in pregnancy, controlled by oral hypoglycemic drugs: Secondary | ICD-10-CM | POA: Diagnosis not present

## 2019-10-19 DIAGNOSIS — O0993 Supervision of high risk pregnancy, unspecified, third trimester: Secondary | ICD-10-CM

## 2019-10-19 DIAGNOSIS — Z3A35 35 weeks gestation of pregnancy: Secondary | ICD-10-CM

## 2019-10-19 LAB — POCT URINALYSIS DIPSTICK OB
Glucose, UA: NEGATIVE
POC,PROTEIN,UA: NEGATIVE

## 2019-10-19 NOTE — Progress Notes (Signed)
Routine Prenatal Care Visit  Subjective  Frances Lamb is a 23 y.o. G2P0010 at 4682w6d being seen today for ongoing prenatal care.  She is currently monitored for the following issues for this high-risk pregnancy and has Chronic hypertension affecting pregnancy; Supervision of high-risk pregnancy; Bipolar disease during pregnancy in first trimester (HCC); Obesity, Class III, BMI 40-49.9 (morbid obesity) (HCC); Anxiety state; Gestational diabetes mellitus in pregnancy; Indication for care in labor and delivery, antepartum; and Labor and delivery, indication for care on their problem list.  ----------------------------------------------------------------------------------- Patient reports she did not take her blood pressure medication this morning as usual which most likely accounts for her elevated BP today. She denies headache, visual changes or epigastric pain. We reviewed her BS log- all she had was the last 3 days on her phone. 3/4 fasting were elevated around 100-110. Post prandial were normal to slightly elevated. We talked about her erratic work schedule/3rd shift/other stressors as contributors to her blood sugar and blood pressure issues. At this time she is scheduled for an rob with nst next Wednesday as her only appointment of the week- she is supposed to be seen 2x/wk given risk factors. That may be difficult with short work week/holiday next week. She is encouraged to take her BP medication as prescribed and to follow healthy diabetic diet/check sugar regularly. I increased her dosing of metformin to BID today.    Contractions: Not present. Vag. Bleeding: None.  Movement: Present. Leaking Fluid denies.  ----------------------------------------------------------------------------------- The following portions of the patient's history were reviewed and updated as appropriate: allergies, current medications, past family history, past medical history, past social history, past surgical history and  problem list. Problem list updated.  Objective  Blood pressure (!) 140/92, weight 274 lb (124.3 kg), last menstrual period 02/03/2019. Pregravid weight 230 lb (104.3 kg) Total Weight Gain 44 lb (20 kg) Urinalysis: Urine Protein Negative  Urine Glucose Negative  Fetal Status: Fetal Heart Rate (bpm): 135   Movement: Present  Presentation: Vertex   AFI: 16.4 NST: reactive 20 minute tracing, 135 bpm baseline, moderate variability, +accelerations, -decelerations  See above note for BS log information  General:  Alert, oriented and cooperative. Patient is in no acute distress.  Skin: Skin is warm and dry. No rash noted.   Cardiovascular: Normal heart rate noted  Respiratory: Normal respiratory effort, no problems with respiration noted  Abdomen: Soft, gravid, appropriate for gestational age. Pain/Pressure: Absent     Pelvic:  Cervical exam deferred        Extremities: Normal range of motion.  Edema: None  Mental Status: Normal mood and affect. Normal behavior. Normal judgment and thought content.   Assessment   23 y.o. G2P0010 at 4782w6d by  11/17/2019, by Ultrasound presenting for routine prenatal visit  Plan   pregnancy2 Problems (from 02/03/19 to present)    Problem Noted Resolved   Gestational diabetes mellitus in pregnancy 09/18/2019 by Tresea MallGledhill, Moneisha Vosler, CNM No   Overview Addendum 10/16/2019 11:45 AM by Conard NovakJackson, Stephen D, MD    Current Diabetic Medications:  Metformin 500 mg before bed (plan to increase to 1,000 mg bid) [ ]  Aspirin 81 mg daily after 12 weeks; discontinue after 36 weeks (? A2/B GDM)  Required Referrals for A1GDM or A2GDM: [x]  Diabetes Education and Testing Supplies [x]  Nutrition Cousult  For A2/B GDM or higher classes of DM [ ]  Diabetes Education and Testing Supplies [ ]  Nutrition Counsult [ ]  Fetal ECHO after 22-24 weeks  [ ]  Eye exam for retina evaluation  [ ]   Baseline EKG [ ]  US fetal growth every 4 weeks starting at 28 weeks [ ]  Twice weekly NST starting  at [redacted] weeks gestation [ ]  Delivery planning contingent on fetal growth, AFI, glycemic control, and other co-morbidities but at least by 39 weeks  Baseline and surveillance labs (pulled in from [facility], refresh links as needed)  Lab Results  Component Value Date   CREATININE 0.44 09/05/2019   AST 19 09/05/2019   ALT 19 09/05/2019   PROTCRRATIO 0.10 09/05/2019   No results found for: HGBA1C  Antenatal Testing Class of DM U/S NST/AFI DELIVERY  Diabetes   A1 - good control - O24.410    A2 - good control - O24.419      A2  - poor control or poor compliance - O24.419, E11.65   (Macrosomia or polyhydramnios) **E11.65 is extra code for poor control**    A2/B - O24.919  and B-C O24.319  Poor control B-C or D-R-F-T - O24.319  or  Type I DM - O24.019  20-38  20-38  20-24-28-32-36   20-24-28-32-35-38//fetal echo  20-24-27-30-33-36-38//fetal echo  40  32//2 x wk  32//2 x wk   32//2 x wk  28//BPP wkly then 32//2 x wk  40  39  PRN   39  PRN         Chronic hypertension affecting pregnancy 03/14/2019 by Gae Dry, MD No   Overview Addendum 10/16/2019 11:45 AM by Will Bonnet, MD    [ ]  Aspirin 81 mg daily after 12 weeks; discontinue after 36 weeks [x]  baseline labs with CBC, CMP, urine protein/creatinine ratio [n/a ] no BP meds unless BPs become elevated [ ]  ultrasound for growth at 28[x] , 32[x] , 36 weeks [ ]  [x]  Aspirin 81 mg daily after 12 weeks; discontinue after 36 weeks  Current antihypertensives:  metoprolol 25 mg Qdaily   Baseline and surveillance labs (pulled in from [facility], refresh links as needed)  Lab Results  Component Value Date   PLT 275 06/24/2019   CREATININE 0.48 06/24/2019   AST 16 06/24/2019   ALT 15 06/24/2019    Antenatal Testing CHTN - O10.919  Group I  BP < 140/90, no preeclampsia, AGA,  nml AFV, +/- meds    Group II BP > 140/90, on meds, no preeclampsia, AGA, nml AFV  20-28-34-38  20-24-28-32-35-38  32//2 x wk   28//BPP wkly then 32//2 x wk  40 no meds; 39 meds  PRN or 37  Pre-eclampsia  GHTN - O13.9/Preeclampsia without severe features  - O14.00   Preeclampsia with severe features - O14.10  Q 3-4wks  Q 2 wks  28//BPP wkly then 32//2 x wk  Inpatient  37  PRN or 34         Supervision of high-risk pregnancy 03/14/2019 by Gae Dry, MD No   Overview Addendum 09/18/2019 10:06 AM by Rod Can, [place] Prenatal Labs  Dating [place] 6 weeks Blood type: O/Positive/-- (04/16 1418)   Genetic Screen  NIPS: normal XY Antibody:Negative (04/16 1418)  Anatomic [place]  Rubella: 1.10 (04/16 1418) Varicella: NONIMMUNE  GTT Early:  91              28wk: GDM- [3 hr 2/4 abnml]  RPR: Non Reactive (04/16 1418)   Rhogam O POS HBsAg: Negative (04/16 1418)   TDaP vaccine                  Flu Shot:2019 done; needs fall 2020 HIV: Non  Reactive (04/16 1418)   Baby Food    Breast                            GBS:   Contraception  Pap: 2020 NIL  Tob  Encouraged cessation ecigs   CS/VBAC na   Support Person Husband           Bipolar disease during pregnancy in first trimester (HCC) 03/14/2019 by Nadara Mustard, MD No   Obesity, Class III, BMI 40-49.9 (morbid obesity) (HCC) 03/14/2019 by Nadara Mustard, MD No   Overview Signed 08/27/2019 11:41 AM by Conard Novak, MD    BMI >=40 [ ]  early 1h gtt -  [ ]  u/s for dating [ ]   [ ]  nutritional goals [ ]  folic acid 1mg  [ ]  bASA (>12 weeks) [ ]  consider nutrition consult [ ]  consider maternal EKG 1st trimester [ ]  Growth u/s 28 [ ] , 32 [ ] , 36 weeks [ ]  [ ]  NST/AFI weekly 36+ weeks (36[] , 37[] , 38[] , 39[] , 40[] ) [ ]  IOL by 41 weeks (scheduled, prn [] )           Preterm labor symptoms and general obstetric precautions including but not limited to vaginal bleeding, contractions, leaking of fluid and fetal movement were reviewed in detail with the patient.    Return in about 1 week (around 10/26/2019) for growth/afi/nst/rob/has rob  scheduled 11/25 with nst but no AFI or growth.  , CNM 10/19/2019 1:31 PM

## 2019-10-19 NOTE — Progress Notes (Signed)
ROB AFI/NST B/P recheck 144/96

## 2019-10-24 ENCOUNTER — Ambulatory Visit (INDEPENDENT_AMBULATORY_CARE_PROVIDER_SITE_OTHER): Payer: BC Managed Care – PPO

## 2019-10-24 ENCOUNTER — Encounter: Payer: Self-pay | Admitting: Obstetrics and Gynecology

## 2019-10-24 ENCOUNTER — Other Ambulatory Visit (HOSPITAL_COMMUNITY)
Admission: RE | Admit: 2019-10-24 | Discharge: 2019-10-24 | Disposition: A | Discharge status: Home / Self Care | Payer: Managed Care, Other (non HMO) | Source: Ambulatory Visit | Attending: Obstetrics and Gynecology | Admitting: Obstetrics and Gynecology

## 2019-10-24 ENCOUNTER — Other Ambulatory Visit: Payer: Self-pay

## 2019-10-24 ENCOUNTER — Observation Stay
Admission: EM | Admit: 2019-10-24 | Discharge: 2019-10-25 | Disposition: A | Discharge status: Home / Self Care | Payer: Managed Care, Other (non HMO) | Attending: Certified Nurse Midwife | Admitting: Certified Nurse Midwife

## 2019-10-24 ENCOUNTER — Ambulatory Visit (INDEPENDENT_AMBULATORY_CARE_PROVIDER_SITE_OTHER): Payer: BC Managed Care – PPO | Admitting: Obstetrics and Gynecology

## 2019-10-24 VITALS — BP 150/98 | Wt 273.0 lb

## 2019-10-24 DIAGNOSIS — F419 Anxiety disorder, unspecified: Secondary | ICD-10-CM | POA: Diagnosis not present

## 2019-10-24 DIAGNOSIS — O0993 Supervision of high risk pregnancy, unspecified, third trimester: Secondary | ICD-10-CM

## 2019-10-24 DIAGNOSIS — Z23 Encounter for immunization: Secondary | ICD-10-CM

## 2019-10-24 DIAGNOSIS — Z833 Family history of diabetes mellitus: Secondary | ICD-10-CM | POA: Diagnosis not present

## 2019-10-24 DIAGNOSIS — O24419 Gestational diabetes mellitus in pregnancy, unspecified control: Secondary | ICD-10-CM | POA: Insufficient documentation

## 2019-10-24 DIAGNOSIS — O10919 Unspecified pre-existing hypertension complicating pregnancy, unspecified trimester: Secondary | ICD-10-CM

## 2019-10-24 DIAGNOSIS — O24415 Gestational diabetes mellitus in pregnancy, controlled by oral hypoglycemic drugs: Secondary | ICD-10-CM

## 2019-10-24 DIAGNOSIS — O26893 Other specified pregnancy related conditions, third trimester: Secondary | ICD-10-CM | POA: Insufficient documentation

## 2019-10-24 DIAGNOSIS — R519 Headache, unspecified: Secondary | ICD-10-CM | POA: Diagnosis not present

## 2019-10-24 DIAGNOSIS — O1213 Gestational proteinuria, third trimester: Secondary | ICD-10-CM | POA: Diagnosis not present

## 2019-10-24 DIAGNOSIS — O10019 Pre-existing essential hypertension complicating pregnancy, unspecified trimester: Secondary | ICD-10-CM

## 2019-10-24 DIAGNOSIS — F319 Bipolar disorder, unspecified: Secondary | ICD-10-CM | POA: Diagnosis not present

## 2019-10-24 DIAGNOSIS — Z20828 Contact with and (suspected) exposure to other viral communicable diseases: Secondary | ICD-10-CM | POA: Insufficient documentation

## 2019-10-24 DIAGNOSIS — Z79899 Other long term (current) drug therapy: Secondary | ICD-10-CM | POA: Insufficient documentation

## 2019-10-24 DIAGNOSIS — Z3A36 36 weeks gestation of pregnancy: Secondary | ICD-10-CM

## 2019-10-24 DIAGNOSIS — O99213 Obesity complicating pregnancy, third trimester: Secondary | ICD-10-CM

## 2019-10-24 DIAGNOSIS — O133 Gestational [pregnancy-induced] hypertension without significant proteinuria, third trimester: Principal | ICD-10-CM | POA: Insufficient documentation

## 2019-10-24 DIAGNOSIS — O163 Unspecified maternal hypertension, third trimester: Secondary | ICD-10-CM | POA: Diagnosis present

## 2019-10-24 DIAGNOSIS — Z362 Encounter for other antenatal screening follow-up: Secondary | ICD-10-CM

## 2019-10-24 DIAGNOSIS — O10913 Unspecified pre-existing hypertension complicating pregnancy, third trimester: Secondary | ICD-10-CM

## 2019-10-24 DIAGNOSIS — O119 Pre-existing hypertension with pre-eclampsia, unspecified trimester: Secondary | ICD-10-CM

## 2019-10-24 DIAGNOSIS — O99343 Other mental disorders complicating pregnancy, third trimester: Secondary | ICD-10-CM | POA: Insufficient documentation

## 2019-10-24 DIAGNOSIS — Z7984 Long term (current) use of oral hypoglycemic drugs: Secondary | ICD-10-CM | POA: Diagnosis not present

## 2019-10-24 DIAGNOSIS — Z87891 Personal history of nicotine dependence: Secondary | ICD-10-CM | POA: Insufficient documentation

## 2019-10-24 DIAGNOSIS — F418 Other specified anxiety disorders: Secondary | ICD-10-CM

## 2019-10-24 LAB — POCT URINALYSIS DIPSTICK OB: Glucose, UA: NEGATIVE

## 2019-10-24 LAB — PROTEIN / CREATININE RATIO, URINE
Creatinine, Urine: 124 mg/dL
Protein Creatinine Ratio: 0.51 mg/mg{Cre} — ABNORMAL HIGH (ref 0.00–0.15)
Total Protein, Urine: 63 mg/dL

## 2019-10-24 LAB — COMPREHENSIVE METABOLIC PANEL
ALT: 50 U/L — ABNORMAL HIGH (ref 0–44)
AST: 35 U/L (ref 15–41)
Albumin: 2.7 g/dL — ABNORMAL LOW (ref 3.5–5.0)
Alkaline Phosphatase: 122 U/L (ref 38–126)
Anion gap: 9 (ref 5–15)
BUN: 8 mg/dL (ref 6–20)
CO2: 19 mmol/L — ABNORMAL LOW (ref 22–32)
Calcium: 8.7 mg/dL — ABNORMAL LOW (ref 8.9–10.3)
Chloride: 108 mmol/L (ref 98–111)
Creatinine, Ser: 0.46 mg/dL (ref 0.44–1.00)
GFR calc Af Amer: >60 mL/min (ref >60)
GFR calc non Af Amer: >60 mL/min (ref >60)
Glucose, Bld: 156 mg/dL — ABNORMAL HIGH (ref 70–99)
Potassium: 3.6 mmol/L (ref 3.5–5.1)
Sodium: 136 mmol/L (ref 135–145)
Total Bilirubin: 0.5 mg/dL (ref 0.3–1.2)
Total Protein: 6.2 g/dL — ABNORMAL LOW (ref 6.5–8.1)

## 2019-10-24 LAB — CBC
HCT: 34.2 % — ABNORMAL LOW (ref 36.0–46.0)
Hemoglobin: 11.2 g/dL — ABNORMAL LOW (ref 12.0–15.0)
MCH: 26.7 pg (ref 26.0–34.0)
MCHC: 32.7 g/dL (ref 30.0–36.0)
MCV: 81.6 fL (ref 80.0–100.0)
Platelets: 293 10*3/uL (ref 150–400)
RBC: 4.19 MIL/uL (ref 3.87–5.11)
RDW: 14.4 % (ref 11.5–15.5)
WBC: 14 10*3/uL — ABNORMAL HIGH (ref 4.0–10.5)
nRBC: 0 % (ref 0.0–0.2)

## 2019-10-24 LAB — GLUCOSE, CAPILLARY
Glucose-Capillary: 105 mg/dL — ABNORMAL HIGH (ref 70–99)
Glucose-Capillary: 110 mg/dL — ABNORMAL HIGH (ref 70–99)
Glucose-Capillary: 66 mg/dL — ABNORMAL LOW (ref 70–99)
Glucose-Capillary: 89 mg/dL (ref 70–99)

## 2019-10-24 LAB — TYPE AND SCREEN
ABO/RH(D): O POS
Antibody Screen: NEGATIVE

## 2019-10-24 LAB — SARS CORONAVIRUS 2 (TAT 6-24 HRS): SARS Coronavirus 2: NEGATIVE

## 2019-10-24 MED ORDER — DULOXETINE HCL 60 MG PO CPEP
60.0000 mg | ORAL_CAPSULE | Freq: Every day | ORAL | Status: DC
  Administered 2019-10-25: 60 mg via ORAL
  Filled 2019-10-24 (×2): qty 1

## 2019-10-24 MED ORDER — CALCIUM CARBONATE ANTACID 500 MG PO CHEW
CHEWABLE_TABLET | ORAL | Status: AC
  Filled 2019-10-24: qty 1

## 2019-10-24 MED ORDER — METOPROLOL SUCCINATE ER 25 MG PO TB24
25.0000 mg | ORAL_TABLET | Freq: Every day | ORAL | Status: DC
  Filled 2019-10-24 (×2): qty 1

## 2019-10-24 MED ORDER — METFORMIN HCL 500 MG PO TABS
500.0000 mg | ORAL_TABLET | Freq: Two times a day (BID) | ORAL | Status: DC
  Administered 2019-10-24 – 2019-10-25 (×2): 500 mg via ORAL
  Filled 2019-10-24: qty 1

## 2019-10-24 MED ORDER — ZOLPIDEM TARTRATE 5 MG PO TABS
5.0000 mg | ORAL_TABLET | Freq: Every evening | ORAL | Status: DC | PRN
  Administered 2019-10-24: 5 mg via ORAL
  Filled 2019-10-24: qty 1

## 2019-10-24 MED ORDER — METFORMIN HCL 500 MG PO TABS
500.0000 mg | ORAL_TABLET | Freq: Two times a day (BID) | ORAL | Status: DC
  Filled 2019-10-24: qty 1

## 2019-10-24 MED ORDER — BETAMETHASONE SOD PHOS & ACET 6 (3-3) MG/ML IJ SUSP
12.0000 mg | INTRAMUSCULAR | Status: AC
  Administered 2019-10-24 – 2019-10-25 (×2): 12 mg via INTRAMUSCULAR
  Filled 2019-10-24: qty 5

## 2019-10-24 MED ORDER — CALCIUM CARBONATE ANTACID 500 MG PO CHEW
1.0000 | CHEWABLE_TABLET | Freq: Three times a day (TID) | ORAL | Status: DC | PRN
  Administered 2019-10-24: 200 mg via ORAL

## 2019-10-24 NOTE — OB Triage Note (Signed)
Pt was sent from the office this morning for PIH evaluation fdue to elevated BP and proteinuria. Pt states she has "pressure headaches" but denies blurry vision and RUQ pain. Pt states she has noticed swelling in her bilateral lower extremities 3-4 days ago. States it has not gotten worse, but it is a new onset. Pt has +3 reflexes and clonus. Pt states she has had some cramping and had spotting 3 days ago, but nothing sense then. VSS on arrival. Monitors applied and assessing.

## 2019-10-24 NOTE — Progress Notes (Signed)
ROB/US/NST C/o heartburn, some spotting a couple days Denies lof, Good FM

## 2019-10-24 NOTE — Progress Notes (Signed)
Routine Prenatal Care Visit  Subjective  Frances Lamb is a 23 y.o. G2P0010 at [redacted]w[redacted]d being seen today for ongoing prenatal care.  She is currently monitored for the following issues for this low-risk pregnancy and has Chronic hypertension affecting pregnancy; Supervision of high-risk pregnancy; Bipolar disease during pregnancy in first trimester (HCC); Obesity, Class III, BMI 40-49.9 (morbid obesity) (HCC); Anxiety state; Gestational diabetes mellitus in pregnancy; Indication for care in labor and delivery, antepartum; and Labor and delivery, indication for care on their problem list.  ----------------------------------------------------------------------------------- Patient reports no complaints.   Contractions: Not present. Vag. Bleeding: None.  Movement: Present. Denies leaking of fluid.  ----------------------------------------------------------------------------------- The following portions of the patient's history were reviewed and updated as appropriate: allergies, current medications, past family history, past medical history, past social history, past surgical history and problem list. Problem list updated.   Objective  Blood pressure (!) 150/98, weight 273 lb (123.8 kg), last menstrual period 02/03/2019. Pregravid weight 230 lb (104.3 kg) Total Weight Gain 43 lb (19.5 kg) Urinalysis:      Fetal Status: Fetal Heart Rate (bpm): 135   Movement: Present     General:  Alert, oriented and cooperative. Patient is in no acute distress.  Skin: Skin is warm and dry. No rash noted.   Cardiovascular: Normal heart rate noted  Respiratory: Normal respiratory effort, no problems with respiration noted  Abdomen: Soft, gravid, appropriate for gestational age. Pain/Pressure: Absent     Pelvic:  Cervical exam performed Dilation: Closed Effacement (%): 0 Station: -3  Extremities: Normal range of motion.  Edema: Trace  Mental Status: Normal mood and affect. Normal behavior. Normal judgment and  thought content.     Assessment   23 y.o. G2P0010 at [redacted]w[redacted]d by  11/17/2019, by Ultrasound presenting for routine prenatal visit  Plan   pregnancy2 Problems (from 02/03/19 to present)    Problem Noted Resolved   Gestational diabetes mellitus in pregnancy 09/18/2019 by Tresea Mall, CNM No   Overview Addendum 10/16/2019 11:45 AM by Conard Novak, MD    Current Diabetic Medications:  Metformin 500 mg before bed (plan to increase to 1,000 mg bid) [ ]  Aspirin 81 mg daily after 12 weeks; discontinue after 36 weeks (? A2/B GDM)  Required Referrals for A1GDM or A2GDM: [x]  Diabetes Education and Testing Supplies [x]  Nutrition Cousult  For A2/B GDM or higher classes of DM [ ]  Diabetes Education and Testing Supplies [ ]  Nutrition Counsult [ ]  Fetal ECHO after 22-24 weeks  [ ]  Eye exam for retina evaluation  [ ]  Baseline EKG [ ]  fetal growth every 4 weeks starting at 28 weeks [ ]  Twice weekly NST starting at [redacted] weeks gestation [ ]  Delivery planning contingent on fetal growth, AFI, glycemic control, and other co-morbidities but at least by 39 weeks  Baseline and surveillance labs (pulled in from [facility], refresh links as needed)  Lab Results  Component Value Date   CREATININE 0.44 09/05/2019   AST 19 09/05/2019   ALT 19 09/05/2019   PROTCRRATIO 0.10 09/05/2019   No results found for: HGBA1C  Antenatal Testing Class of DM U/S NST/AFI DELIVERY  Diabetes   A1 - good control - O24.410    A2 - good control - O24.419      A2  - poor control or poor compliance - O24.419, E11.65   (Macrosomia or polyhydramnios) **E11.65 is extra code for poor control**    A2/B - O24.919  and B-C O24.319  Poor control  B-C or D-R-F-T - O24.319  or  Type I DM - O24.019  20-38  20-38  20-24-28-32-36   20-24-28-32-35-38//fetal echo  20-24-27-30-33-36-38//fetal echo  40  32//2 x wk  32//2 x wk   32//2 x wk  28//BPP wkly then 32//2 x wk  40  39  PRN   39  PRN          Chronic hypertension affecting pregnancy 03/14/2019 by Nadara MustardHarris, Robert P, MD No   Overview Addendum 10/16/2019 11:45 AM by Conard NovakJackson, Stephen D, MD    [ ]  Aspirin 81 mg daily after 12 weeks; discontinue after 36 weeks [x]  baseline labs with CBC, CMP, urine protein/creatinine ratio [n/a ] no BP meds unless BPs become elevated [ ]  ultrasound for growth at 28[x] , 32[x] , 36 weeks [ ]  [x]  Aspirin 81 mg daily after 12 weeks; discontinue after 36 weeks  Current antihypertensives:  metoprolol 25 mg Qdaily   Baseline and surveillance labs [facility], refresh links as needed)  Lab Results  Component Value Date   PLT 275 06/24/2019   CREATININE 0.48 06/24/2019   AST 16 06/24/2019   ALT 15 06/24/2019    Antenatal Testing CHTN - O10.919  Group I  BP < 140/90, no preeclampsia, AGA,  nml AFV, +/- meds    Group II BP > 140/90, on meds, no preeclampsia, AGA, nml AFV  20-28-34-38  20-24-28-32-35-38  32//2 x wk  28//BPP wkly then 32//2 x wk  40 no meds; 39 meds  PRN or 37  Pre-eclampsia  GHTN - O13.9/Preeclampsia without severe features  - O14.00   Preeclampsia with severe features - O14.10  Q 3-4wks  Q 2 wks  28//BPP wkly then 32//2 x wk  Inpatient  37  PRN or 34         Supervision of high-risk pregnancy 03/14/2019 by Nadara MustardHarris, Robert P, MD No   Overview Addendum 09/18/2019 10:06 AM by Tresea MallGledhill, Jane, CNM    Clinic [facility] Prenatal Labs  Dating US 6 weeks Blood type: O/Positive/-- (04/16 1418)   Genetic Screen  NIPS: normal XY Antibody:Negative (04/16 1418)  Anatomic US  Rubella: 1.10 (04/16 1418) Varicella: NONIMMUNE  GTT Early:  91              28wk: GDM- [3 hr 2/4 abnml]  RPR: Non Reactive (04/16 1418)   Rhogam O POS HBsAg: Negative (04/16 1418)   TDaP vaccine                  Flu Shot:2019 done; needs fall 2020 HIV: Non Reactive (04/16 1418)   Baby Food    Breast                            GBS:   Contraception  Pap: 2020 NIL  Tob  Encouraged cessation ecigs    CS/VBAC na   Support Person Husband           Bipolar disease during pregnancy in first trimester (HCC) 03/14/2019 by Nadara MustardHarris, Robert P, MD No   Obesity, Class III, BMI 40-49.9 (morbid obesity) (HCC) 03/14/2019 by Nadara MustardHarris, Robert P, MD No   Overview Signed 08/27/2019 11:41 AM by Conard NovakJackson, Stephen D, MD    BMI >=40 [ ]  early 1h gtt -  [ ]  u/s for dating [ ]   [ ]  nutritional goals [ ]  folic acid 1mg  [ ]  bASA (>12 weeks) [ ]  consider nutrition consult [ ]  consider maternal EKG 1st  trimester [ ]  Growth u/s 28 [ ] , 32 [ ] , 36 weeks [ ]  [ ]  NST/AFI weekly 36+ weeks (36[] , 37[] , 38[] , 39[] , 40[] ) [ ]  IOL by 41 weeks (scheduled, prn [] )           Gestational age appropriate obstetric precautions including but not limited to vaginal bleeding, contractions, leaking of fluid and fetal movement were reviewed in detail with the patient.    Reviewed blood glucose log- patient has had improved control with taking metformin BID- continue. BP is elevated today. She denies headache or vision changes. She has some swelling of her legs. She has not taken her BP medications today. She reports that sometimes she takes them in the morning and sometimes at night depending on when she is working. She works third shift as a Animal nutritionist.  Asked patient to take her BP medication and proceed to L&D for preeclampsia evaluation.   If she does not have preeclampsia and is discharged:  Given the difficulties of her schedule I would recommend she be taken out of work so she can focus on her health before the delivery.  Would recommend delivery between 51 0/7-38 6/[redacted] weeks gestation secondary to complications of chronic hypertension and gestational diabetes on metformin.   Return in about 5 days (around 10/29/2019) for 2 appointments next week for ROB and NST/ [place] in 1 week. Homero Fellers MD [facility], [facility] 10/24/2019, 12:16 PM

## 2019-10-24 NOTE — H&P (Signed)
OB History & Physical   History of Present Illness:  Chief Complaint:   HPI:  Frances Lamb is a 23 y.o. G104P0010 female with EDC=11/17/2019 at 35w4ddated by a 6wk4d ultrasound.  Her pregnancy has been complicated by GDMA2 (on Metformin 500 mgm BID), chronic hypertension (on Toprol XL  25 mgm daily), anxiety, depression/bipolar disorder, and obesity (current BMI 49.93 kg/m2)..  [facility] Her blood pressure at the office today was 150/98 and she had +1 proteinuria. She had not taken her Toprol XL 25 mgm prior to her visit. She went home and took her Toprol as well as her Metformin prior to coming to L&D.    Has been having some headaches on the crown of her head since the weekend, but no headaches at this time. Denies visual changes, CP, SOB, RUQ pain. Has not had regular contractions, leakage of water, or vaginal bleeding.    [facility] # [facility] and the following: . [facility] Prenatal Labs  Dating [place]6 weeks Blood type: O/Positive/-- (04/16 1418)   Genetic Screen  NIPS: normal XY Antibody:Negative (04/16 1418)  Anatomic [place]Normal anatomy, female infant, posterior placenta Rubella: 1.10 (04/16 1418) Varicella: NONIMMUNE  GTT Early:  91              28wk: 208  [91/221/190/111] GDM  RPR: Non Reactive (04/16 1418)   Rhogam O POS HBsAg: Negative (04/16 1418)   TDaP vaccine     10/24/19             Flu Shot:2019 done; needs fall 2020 HIV: Non Reactive (04/16 1418)   Baby Food    Breast                            GBS: pending  Contraception  Pap: 2020 NIL  Tob  Encouraged cessation ecigs   CS/VBAC na   Support Person Husband          Maternal Medical History:   Past Medical History:  Diagnosis Date  . Anxiety   . Bipolar 2 disorder ([place]   . Depression   . Gestational diabetes   . Hypertension   . Migraine   . Miscarriage     Past Surgical History:  Procedure Laterality  Date  . TONSILLECTOMY AND ADENOIDECTOMY      No Known Allergies  Prior to Admission medications   Medication Sig Start Date End Date Taking? Authorizing Provider  Blood Glucose Monitoring Suppl (ONETOUCH VERIO FLEX SYSTEM) w/Device KIT Check blood sugar 4 times daily; fasting and 2 hours after each meal 09/18/19  Yes GRod Can CNM  cyclobenzaprine (FLEXERIL) 10 MG tablet Take 1 tablet (10 mg total) by mouth 2 (two) times daily as needed for muscle spasms. 10/02/19  Yes GRod Can CNM  DULoxetine (CYMBALTA) 60 MG capsule Take 60 mg by mouth daily.   Yes [provider]  glucose blood test strip Use as instructed 10/02/19  Yes GRod Can CNM  hydrOXYzine (ATARAX/VISTARIL) 50 MG tablet Take 1 tablet (50 mg total) by mouth every 6 (six) hours as needed for anxiety, itching or nausea. 10/16/19  Yes JWill Bonnet MD  metFORMIN (GLUCOPHAGE) 500 MG tablet Take 1 tablet (500 mg total) by mouth 2 (two) times daily. Start with 507mdaily before bedtime for one week, then increase to twice daily 10/16/19  Yes Will Bonnet, MD  metoprolol succinate (TOPROL-XL) 25 MG 24 hr tablet Take 1 tablet (25 mg total) by mouth daily. 07/12/19 07/11/20 Yes Gae Dry, MD  ondansetron (ZOFRAN ODT) 4 MG disintegrating tablet Take 1 tablet (4 mg total) by mouth every 6 (six) hours as needed for nausea. 03/27/19  Yes Gae Dry, MD  Prenatal Vit-Fe Fumarate-FA (PRENATAL COMPLETE PO) Take 1 tablet by mouth daily.   Yes [provider]          Social History: She  reports that she quit smoking about 6 years ago. Her smoking use included cigarettes. She uses smokeless tobacco. She reports previous alcohol use. She reports that she does not use drugs.  Family History: family history includes Diabetes in her maternal grandfather and mother.   Review of Systems: Negative x 10 systems reviewed except as noted in the HPI.      Physical Exam:  Vital Signs: BP (!)  136/91   Pulse 99   Temp 98.7 F (37.1 C) (Oral)   Resp 18   Ht _0  (1.575 m)   Wt 123.8 kg   LMP 02/03/2019   BMI 49.93 kg/m    Patient Vitals for the past 24 hrs:  BP Temp Temp src Pulse Resp Height Weight  10/24/19 1635 (!) 136/91 - - 99 - - -  10/24/19 1620 137/82 - - 100 - - -  10/24/19 1606 (!) 141/82 - - (!) 106 - - -  10/24/19 1551 (!) 149/86 - - (!) 103 - - -  10/24/19 1531 (!) 154/103 - - (!) 112 - - -  10/24/19 1519 (!) 154/99 - - (!) 102 - - -  10/24/19 1515 (!) 150/102 - - (!) 102 - - -  10/24/19 1501 138/79 - - 95 - - -  10/24/19 1445 136/88 - - 100 - - -  10/24/19 1431 123/73 - - (!) 115 - - -  10/24/19 1415 (!) 141/84 - - 87 - - -  10/24/19 1400 (!) 140/97 - - 96 - - -  10/24/19 1345 139/86 - - 92 - - -  10/24/19 1334 (!) 142/96 98.7 F (37.1 C) Oral (!) 105 18 _1  (1.575 m) 123.8 kg   General: no acute distress.  HEENT: normocephalic, atraumatic Heart: regular rate & rhythm.  No murmurs/rubs/gallops Lungs: clear to auscultation bilaterally Abdomen: soft, gravid, non-tender;  EFW: 5#14oz on [place] today Pelvic:   External: Normal external female genitalia  Cervix: closed/ soft on Dr Gennette Pac exam  Extremities: non-tender, symmetric, trace edema bilaterally.  DTRs: +3/+3  Neurologic: Alert & oriented x 3.   Baseline FHR: 140 baseline with accelerations to 180s, moderate variability Toco: some uterine irritability  Results for orders placed or performed during the hospital encounter of 10/24/19 (from the past 24 hour(s))  Type and screen [place]     Status: None   Collection Time: 10/24/19  1:52 PM  Result Value Ref Range   ABO/RH(D) O POS    Antibody Screen NEG    Sample Expiration      10/27/2019,2359 Performed at [facility], [address] [place], [place] [address]   CBC     Status: Abnormal   Collection Time: 10/24/19  1:54 PM  Result Value Ref Range   WBC 14.0 (H) 4.0 - 10.5 K/uL   RBC 4.19 3.87 - 5.11  MIL/uL   Hemoglobin 11.2 (L) 12.0 - 15.0 g/dL   HCT 34.2 (  L) 36.0 - 46.0 %   MCV 81.6 80.0 - 100.0 fL   MCH 26.7 26.0 - 34.0 pg   MCHC 32.7 30.0 - 36.0 g/dL   RDW 14.4 11.5 - 15.5 %   Platelets 293 150 - 400 K/uL   nRBC 0.0 0.0 - 0.2 %  Protein / creatinine ratio, urine     Status: Abnormal   Collection Time: 10/24/19  1:54 PM  Result Value Ref Range   Creatinine, Urine 124 mg/dL   Total Protein, Urine 63 mg/dL   Protein Creatinine Ratio 0.51 (H) 0.00 - 0.15 mg/mg[Cre]  Comprehensive metabolic panel     Status: Abnormal   Collection Time: 10/24/19  1:54 PM  Result Value Ref Range   Sodium 136 135 - 145 mmol/L   Potassium 3.6 3.5 - 5.1 mmol/L   Chloride 108 98 - 111 mmol/L   CO2 19 (L) 22 - 32 mmol/L   Glucose, Bld 156 (H) 70 - 99 mg/dL   BUN 8 6 - 20 mg/dL   Creatinine, Ser 0.46 0.44 - 1.00 mg/dL   Calcium 8.7 (L) 8.9 - 10.3 mg/dL   Total Protein 6.2 (L) 6.5 - 8.1 g/dL   Albumin 2.7 (L) 3.5 - 5.0 g/dL   AST 35 15 - 41 U/L   ALT 50 (H) 0 - 44 U/L   Alkaline Phosphatase 122 38 - 126 U/L   Total Bilirubin 0.5 0.3 - 1.2 mg/dL   GFR calc non Af Amer >60 >60 mL/min   GFR calc Af Amer >60 >60 mL/min   Anion gap 9 5 - 15  Glucose, capillary     Status: Abnormal   Collection Time: 10/24/19  3:16 PM  Result Value Ref Range   Glucose-Capillary 66 (L) 70 - 99 mg/dL  Glucose, capillary     Status: Abnormal   Collection Time: 10/24/19  4:13 PM  Result Value Ref Range   Glucose-Capillary 105 (H) 70 - 99 mg/dL   Assessment:  TIYAH ZELENAK is a 23 y.o. G64P0010 female at 75w4dwith chronic hypertension with superimposed preeclampsia based on her PC ratio of 510 mgm GDMA2. -not well controlled due to changing work schedule and taking Metformin at different times. FWB: reactive NST Plan:  1. Admit to Labor & Delivery for monitoring of blood pressures and serial labs 2. Repeat CMP, CBC in AM 3. GBS done today-.may ned to do GBS PCR if she is induced in the next couple days.   4. Closely  monitor blood pressures 5. Continue CBGs qid and Metformin 500 mgm BID 6. Continue Toprol XL 25 mgm daily  7. Ambien for sleep tonight. 8. CHO restricted diet. 9. Treat severe range blood pressures according to protocol  CDalia Heading, CNM  Addendum: Spoke with Dr SGilman Schmidtwho had consulted [facility]: Can discontinue continuous fetal monitoring. Will get a strip before bedtime. Discussed pros and cons of betamethasone and patient agrees with steroids.   CDalia Heading CNorth Dakota11/25/2020at 1820

## 2019-10-25 DIAGNOSIS — O99213 Obesity complicating pregnancy, third trimester: Secondary | ICD-10-CM | POA: Diagnosis not present

## 2019-10-25 DIAGNOSIS — O119 Pre-existing hypertension with pre-eclampsia, unspecified trimester: Secondary | ICD-10-CM

## 2019-10-25 DIAGNOSIS — O133 Gestational [pregnancy-induced] hypertension without significant proteinuria, third trimester: Secondary | ICD-10-CM | POA: Diagnosis not present

## 2019-10-25 DIAGNOSIS — O24415 Gestational diabetes mellitus in pregnancy, controlled by oral hypoglycemic drugs: Secondary | ICD-10-CM | POA: Diagnosis not present

## 2019-10-25 DIAGNOSIS — Z3A37 37 weeks gestation of pregnancy: Secondary | ICD-10-CM

## 2019-10-25 DIAGNOSIS — O10913 Unspecified pre-existing hypertension complicating pregnancy, third trimester: Secondary | ICD-10-CM | POA: Diagnosis not present

## 2019-10-25 DIAGNOSIS — O10019 Pre-existing essential hypertension complicating pregnancy, unspecified trimester: Secondary | ICD-10-CM

## 2019-10-25 DIAGNOSIS — O99343 Other mental disorders complicating pregnancy, third trimester: Secondary | ICD-10-CM | POA: Diagnosis not present

## 2019-10-25 LAB — COMPREHENSIVE METABOLIC PANEL
ALT: 56 U/L — ABNORMAL HIGH (ref 0–44)
AST: 37 U/L (ref 15–41)
Albumin: 2.8 g/dL — ABNORMAL LOW (ref 3.5–5.0)
Alkaline Phosphatase: 116 U/L (ref 38–126)
Anion gap: 10 (ref 5–15)
BUN: 10 mg/dL (ref 6–20)
CO2: 19 mmol/L — ABNORMAL LOW (ref 22–32)
Calcium: 9.2 mg/dL (ref 8.9–10.3)
Chloride: 109 mmol/L (ref 98–111)
Creatinine, Ser: 0.6 mg/dL (ref 0.44–1.00)
GFR calc Af Amer: >60 mL/min (ref >60)
GFR calc non Af Amer: >60 mL/min (ref >60)
Glucose, Bld: 127 mg/dL — ABNORMAL HIGH (ref 70–99)
Potassium: 4.3 mmol/L (ref 3.5–5.1)
Sodium: 138 mmol/L (ref 135–145)
Total Bilirubin: 0.7 mg/dL (ref 0.3–1.2)
Total Protein: 6.5 g/dL (ref 6.5–8.1)

## 2019-10-25 LAB — CBC
HCT: 34.4 % — ABNORMAL LOW (ref 36.0–46.0)
Hemoglobin: 11.1 g/dL — ABNORMAL LOW (ref 12.0–15.0)
MCH: 26.7 pg (ref 26.0–34.0)
MCHC: 32.3 g/dL (ref 30.0–36.0)
MCV: 82.7 fL (ref 80.0–100.0)
Platelets: 269 10*3/uL (ref 150–400)
RBC: 4.16 MIL/uL (ref 3.87–5.11)
RDW: 14.4 % (ref 11.5–15.5)
WBC: 16.2 10*3/uL — ABNORMAL HIGH (ref 4.0–10.5)
nRBC: 0 % (ref 0.0–0.2)

## 2019-10-25 LAB — RPR: RPR Ser Ql: NONREACTIVE

## 2019-10-25 LAB — GLUCOSE, CAPILLARY
Glucose-Capillary: 107 mg/dL — ABNORMAL HIGH (ref 70–99)
Glucose-Capillary: 115 mg/dL — ABNORMAL HIGH (ref 70–99)

## 2019-10-25 MED ORDER — ACETAMINOPHEN 325 MG PO TABS: 650.0000 mg | ORAL_TABLET | Freq: Once | ORAL | Status: DC

## 2019-10-25 NOTE — Discharge Summary (Signed)
Physician Final Progress Note  Patient ID: Frances Lamb MRN: [id number] DOB/AGE: 07-12-1996 23 y.o.  Admit date: 10/24/2019 Admitting provider: Homero Fellers, MD Discharge date: 10/25/2019   Admission Diagnoses: worsening chronic hypertension in pregnancy, poorly controlled gestational diabetes  Discharge Diagnoses:  Active Problems:   Elevated blood pressure complicating pregnancy in third trimester, antepartum   Pre-eclampsia superimposed on chronic hypertension, antepartum Gestational diabetes IUP at 36 weeks Reactive NST  History of Present Illness: The patient is a 23 y.o. female G2P0010 at 56w5dwho presents from the office yesterday for pregnancy induced hypertension evaluation. Her blood pressure was elevated and she had 1+ proteinuria. She has had some headaches recently relieved by tylenol. She has not had good control of her blood pressure or her blood sugar due to varied shift work, inconsistent medication dosing and non-adherence to diabetic diet.   Her pregnancy has been complicated by: obesity affecting pregnancy with BMI of 49 at 36 weeks, chronic hypertension, gestational diabetes, bipolar disorder, anxiety. She has gained 43 pounds during this pregnancy.   Labs while in triage are consistent with superimposed preeclampsia on chronic hypertension.  At time of discharge she denies headache, visual changes, epigastric pain, shortness of breath, chest pain. She admits good fetal movement. She denies contractions, leakage of fluid or vaginal bleeding.   She is discharged to home with strict preeclampsia precautions and with instructions to return on Saturday at 8 AM for induction of labor at 37 weeks and as needed sooner for s/s of preeclampsia. She received 2 doses of betamethasone over the last 2 days. Plan of care discussed with Dr JGlennon Mac   Past Medical History:  Diagnosis Date  . Anxiety   . Bipolar 2 disorder ([place]   . Depression   . Gestational diabetes    . Hypertension   . Migraine   . Miscarriage     Past Surgical History:  Procedure Laterality Date  . TONSILLECTOMY AND ADENOIDECTOMY      No current facility-administered medications on file prior to encounter.    Current Outpatient Medications on File Prior to Encounter  Medication Sig Dispense Refill  . Blood Glucose Monitoring Suppl (ONETOUCH VERIO FLEX SYSTEM) w/Device KIT Check blood sugar 4 times daily; fasting and 2 hours after each meal 1 kit 0  . cyclobenzaprine (FLEXERIL) 10 MG tablet Take 1 tablet (10 mg total) by mouth 2 (two) times daily as needed for muscle spasms. 30 tablet 2  . DULoxetine (CYMBALTA) 60 MG capsule Take 60 mg by mouth daily.    .Marland Kitchenglucose blood test strip Use as instructed 100 each 12  . hydrOXYzine (ATARAX/VISTARIL) 50 MG tablet Take 1 tablet (50 mg total) by mouth every 6 (six) hours as needed for anxiety, itching or nausea. 30 tablet 4  . metFORMIN (GLUCOPHAGE) 500 MG tablet Take 1 tablet (500 mg total) by mouth 2 (two) times daily. Start with 5057mdaily before bedtime for one week, then increase to twice daily 60 tablet 5  . metoprolol succinate (TOPROL-XL) 25 MG 24 hr tablet Take 1 tablet (25 mg total) by mouth daily. 30 tablet 2  . ondansetron (ZOFRAN ODT) 4 MG disintegrating tablet Take 1 tablet (4 mg total) by mouth every 6 (six) hours as needed for nausea. 20 tablet 0  . Prenatal Vit-Fe Fumarate-FA (PRENATAL COMPLETE PO) Take 1 tablet by mouth daily.      No Known Allergies  Social History   Socioeconomic History  . Marital status: Married    Spouse  name: Not on file  . Number of children: Not on file  . Years of education: Not on file  . Highest education level: Not on file  Occupational History  . Not on file  Social Needs  . Financial resource strain: Not hard at all  . Food insecurity    Worry: Never true    Inability: Never true  . Transportation needs    Medical: No    Non-medical: No  Tobacco Use  . Smoking status: Former  Smoker    Types: Cigarettes    Quit date: 11/29/2012    Years since quitting: 6.9  . Smokeless tobacco: Current User  . Tobacco comment: current vaping weekly  Substance and Sexual Activity  . Alcohol use: Not Currently  . Drug use: Never  . Sexual activity: Yes    Birth control/protection: Pill  Lifestyle  . Physical activity    Days per week: 0 days    Minutes per session: 0 min  . Stress: Not at all  Relationships  . Social connections    Talks on phone: More than three times a week    Gets together: More than three times a week    Attends religious service: Never    Active member of club or organization: No    Attends meetings of clubs or organizations: Never    Relationship status: Not on file  . Intimate partner violence    Fear of current or ex partner: No    Emotionally abused: No    Physically abused: No    Forced sexual activity: No  Other Topics Concern  . Not on file  Social History Narrative  . Not on file    Family History  Problem Relation Age of Onset  . Diabetes Mother   . Diabetes Maternal Grandfather      Review of Systems  Constitutional: Negative.   HENT: Negative.   Eyes: Negative.   Respiratory: Negative.   Cardiovascular: Negative.   Gastrointestinal: Negative.   Genitourinary: Negative.   Musculoskeletal: Negative.   Skin: Negative.   Neurological: Negative.   Endo/Heme/Allergies: Negative.   Psychiatric/Behavioral: Negative.      Physical Exam: BP (!) 138/57 (BP Location: Left Arm)   Pulse (!) 117   Temp 98.3 F (36.8 C) (Oral)   Resp 18   Ht 5' 2"  (1.575 m)   Wt 123.8 kg   LMP 02/03/2019   BMI 49.93 kg/m   Constitutional: Well nourished, well developed female in no acute distress.  HEENT: normal Skin: Warm and dry.  Cardiovascular: Regular rate and rhythm.   Extremity: trace edema  Respiratory: Clear to auscultation bilateral. Normal respiratory effort Abdomen: FHT present Back: no CVAT Neuro: DTRs 2+, Cranial nerves  grossly intact Psych: Alert and Oriented x3. No memory deficits. Normal mood and affect.  MS: normal gait, normal bilateral lower extremity ROM/strength/stability.  Toco: negative for contractions Fetal well being: 140 bpm baseline, moderate variability, +accelerations, -decelerations  Consults: None  Significant Findings/ Diagnostic Studies: labs:   Results for MANDOLIN, FALWELL (MRN [id number]) as of 10/25/2019 13:45  Ref. Range 10/25/2019 06:00 10/25/2019 06:14 10/25/2019 08:21  Glucose-Capillary Latest Ref Range: 70 - 99 mg/dL  107 (H) 115 (H)  COMPREHENSIVE METABOLIC PANEL Unknown Rpt (A)    Sodium Latest Ref Range: 135 - 145 mmol/L 138    Potassium Latest Ref Range: 3.5 - 5.1 mmol/L 4.3    Chloride Latest Ref Range: 98 - 111 mmol/L 109    CO2 Latest  Ref Range: 22 - 32 mmol/L 19 (L)    Glucose Latest Ref Range: 70 - 99 mg/dL 127 (H)    BUN Latest Ref Range: 6 - 20 mg/dL 10    Creatinine Latest Ref Range: 0.44 - 1.00 mg/dL 0.60    Calcium Latest Ref Range: 8.9 - 10.3 mg/dL 9.2    Anion gap Latest Ref Range: 5 - 15  10    Alkaline Phosphatase Latest Ref Range: 38 - 126 U/L 116    Albumin Latest Ref Range: 3.5 - 5.0 g/dL 2.8 (L)    AST Latest Ref Range: 15 - 41 U/L 37    ALT Latest Ref Range: 0 - 44 U/L 56 (H)    Total Protein Latest Ref Range: 6.5 - 8.1 g/dL 6.5    Total Bilirubin Latest Ref Range: 0.3 - 1.2 mg/dL 0.7    GFR, Est Non African American Latest Ref Range: >60 mL/min >60    GFR, Est African American Latest Ref Range: >60 mL/min >60    WBC Latest Ref Range: 4.0 - 10.5 K/uL 16.2 (H)    RBC Latest Ref Range: 3.87 - 5.11 MIL/uL 4.16    Hemoglobin Latest Ref Range: 12.0 - 15.0 g/dL 11.1 (L)    HCT Latest Ref Range: 36.0 - 46.0 % 34.4 (L)    MCV Latest Ref Range: 80.0 - 100.0 fL 82.7    MCH Latest Ref Range: 26.0 - 34.0 pg 26.7    MCHC Latest Ref Range: 30.0 - 36.0 g/dL 32.3    RDW Latest Ref Range: 11.5 - 15.5 % 14.4    Platelets Latest Ref Range: 150 - 400 K/uL 269     nRBC Latest Ref Range: 0.0 - 0.2 % 0.0     GBS is pending  Covid test negative  Procedures: NST  Hospital Course: The patient was admitted to Labor and Delivery Triage for observation. She was placed on monitors, labs collected, CBGs monitored, medications per outpatient, CHO restricted diet.  Discharge Condition: good  Disposition: Discharge disposition: 01-Home or Self Care  Diet: Diabetic diet  Discharge Activity: Activity as tolerated  Discharge Instructions    Discharge activity:  No Restrictions   Complete by: As directed    Discharge diet:   Complete by: As directed    Healthy diabetic pregnancy diet   Fetal Kick Count:  Lie on our left side for one hour after a meal, and count the number of times your baby kicks.  If it is less than 5 times, get up, move around and drink some juice.  Repeat the test 30 minutes later.  If it is still less than 5 kicks in an hour, notify your doctor.   Complete by: As directed    No sexual activity restrictions   Complete by: As directed    Notify physician for a general feeling that "something is not right"   Complete by: As directed    Notify physician for increase or change in vaginal discharge   Complete by: As directed    Notify physician for intestinal cramps, with or without diarrhea, sometimes described as "gas pain"   Complete by: As directed    Notify physician for leaking of fluid   Complete by: As directed    Notify physician for low, dull backache, unrelieved by heat or Tylenol   Complete by: As directed    Notify physician for menstrual like cramps   Complete by: As directed    Notify physician for pelvic pressure  Complete by: As directed    Notify physician for uterine contractions.  These may be painless and feel like the uterus is tightening or the baby is  "balling up"   Complete by: As directed    Notify physician for vaginal bleeding   Complete by: As directed    PRETERM LABOR:  Includes any of the follwing  symptoms that occur between 20 - [redacted] weeks gestation.  If these symptoms are not stopped, preterm labor can result in preterm delivery, placing your baby at risk   Complete by: As directed      Allergies as of 10/25/2019   No Known Allergies     Medication List    TAKE these medications   cyclobenzaprine 10 MG tablet Commonly known as: FLEXERIL Take 1 tablet (10 mg total) by mouth 2 (two) times daily as needed for muscle spasms.   DULoxetine 60 MG capsule Commonly known as: CYMBALTA Take 60 mg by mouth daily.   glucose blood test strip Use as instructed   hydrOXYzine 50 MG tablet Commonly known as: ATARAX/VISTARIL Take 1 tablet (50 mg total) by mouth every 6 (six) hours as needed for anxiety, itching or nausea.   metFORMIN 500 MG tablet Commonly known as: GLUCOPHAGE Take 1 tablet (500 mg total) by mouth 2 (two) times daily. Start with 568m daily before bedtime for one week, then increase to twice daily   metoprolol succinate 25 MG 24 hr tablet Commonly known as: TOPROL-XL Take 1 tablet (25 mg total) by mouth daily.   ondansetron 4 MG disintegrating tablet Commonly known as: Zofran ODT Take 1 tablet (4 mg total) by mouth every 6 (six) hours as needed for nausea.   OneTouch Verio Flex System w/Device Kit Check blood sugar 4 times daily; fasting and 2 hours after each meal   PRENATAL COMPLETE PO Take 1 tablet by mouth daily.        Total time spent taking care of this patient: 30 minutes  Signed: JRod Can CNM  10/25/2019, 1:04 PM

## 2019-10-25 NOTE — Progress Notes (Signed)
Pt d/c home per J.Gledhill, CNM order. Pt received AVS and discharge instructions. Pt given preterm labor precautions and received 2nd dose of BMZ 11/26 @1300 . Pt will continue taking home medications and taking BP at home and will return if BP is elevated.  Pt is scheduled for IOL 10/27/2019 at 0800. Pt d/c home with spouse via personal vehicle.

## 2019-10-26 LAB — CERVICOVAGINAL ANCILLARY ONLY
Chlamydia: NEGATIVE
Comment: NEGATIVE
Comment: NEGATIVE
Comment: NORMAL
Neisseria Gonorrhea: NEGATIVE
Trichomonas: NEGATIVE

## 2019-10-27 ENCOUNTER — Inpatient Hospital Stay
Admission: EM | Admit: 2019-10-27 | Discharge: 2019-10-31 | DRG: 787 | Disposition: A | Discharge status: Home / Self Care | Payer: Managed Care, Other (non HMO) | Attending: Obstetrics & Gynecology | Admitting: Obstetrics & Gynecology

## 2019-10-27 ENCOUNTER — Encounter: Payer: Self-pay | Admitting: *Deleted

## 2019-10-27 ENCOUNTER — Other Ambulatory Visit: Payer: Self-pay

## 2019-10-27 DIAGNOSIS — O9081 Anemia of the puerperium: Secondary | ICD-10-CM | POA: Diagnosis not present

## 2019-10-27 DIAGNOSIS — F418 Other specified anxiety disorders: Secondary | ICD-10-CM | POA: Diagnosis not present

## 2019-10-27 DIAGNOSIS — O119 Pre-existing hypertension with pre-eclampsia, unspecified trimester: Secondary | ICD-10-CM

## 2019-10-27 DIAGNOSIS — Z3A37 37 weeks gestation of pregnancy: Secondary | ICD-10-CM

## 2019-10-27 DIAGNOSIS — Z87891 Personal history of nicotine dependence: Secondary | ICD-10-CM

## 2019-10-27 DIAGNOSIS — O99344 Other mental disorders complicating childbirth: Secondary | ICD-10-CM | POA: Diagnosis not present

## 2019-10-27 DIAGNOSIS — O339 Maternal care for disproportion, unspecified: Secondary | ICD-10-CM | POA: Diagnosis present

## 2019-10-27 DIAGNOSIS — O24415 Gestational diabetes mellitus in pregnancy, controlled by oral hypoglycemic drugs: Secondary | ICD-10-CM

## 2019-10-27 DIAGNOSIS — O10919 Unspecified pre-existing hypertension complicating pregnancy, unspecified trimester: Secondary | ICD-10-CM

## 2019-10-27 DIAGNOSIS — O99214 Obesity complicating childbirth: Secondary | ICD-10-CM | POA: Diagnosis present

## 2019-10-27 DIAGNOSIS — O10019 Pre-existing essential hypertension complicating pregnancy, unspecified trimester: Secondary | ICD-10-CM

## 2019-10-27 DIAGNOSIS — Z3490 Encounter for supervision of normal pregnancy, unspecified, unspecified trimester: Secondary | ICD-10-CM

## 2019-10-27 DIAGNOSIS — O114 Pre-existing hypertension with pre-eclampsia, complicating childbirth: Secondary | ICD-10-CM | POA: Diagnosis present

## 2019-10-27 DIAGNOSIS — O24419 Gestational diabetes mellitus in pregnancy, unspecified control: Secondary | ICD-10-CM | POA: Diagnosis present

## 2019-10-27 DIAGNOSIS — O24425 Gestational diabetes mellitus in childbirth, controlled by oral hypoglycemic drugs: Secondary | ICD-10-CM | POA: Diagnosis present

## 2019-10-27 DIAGNOSIS — D62 Acute posthemorrhagic anemia: Secondary | ICD-10-CM | POA: Diagnosis not present

## 2019-10-27 DIAGNOSIS — O0993 Supervision of high risk pregnancy, unspecified, third trimester: Secondary | ICD-10-CM

## 2019-10-27 DIAGNOSIS — F319 Bipolar disorder, unspecified: Secondary | ICD-10-CM | POA: Diagnosis not present

## 2019-10-27 DIAGNOSIS — O1002 Pre-existing essential hypertension complicating childbirth: Secondary | ICD-10-CM | POA: Diagnosis present

## 2019-10-27 DIAGNOSIS — Z3A35 35 weeks gestation of pregnancy: Secondary | ICD-10-CM

## 2019-10-27 LAB — GLUCOSE, CAPILLARY
Glucose-Capillary: 96 mg/dL (ref 70–99)
Glucose-Capillary: 106 mg/dL — ABNORMAL HIGH (ref 70–99)
Glucose-Capillary: 110 mg/dL — ABNORMAL HIGH (ref 70–99)
Glucose-Capillary: 90 mg/dL (ref 70–99)
Glucose-Capillary: 74 mg/dL (ref 70–99)
Glucose-Capillary: 75 mg/dL (ref 70–99)
Glucose-Capillary: 79 mg/dL (ref 70–99)
Glucose-Capillary: 101 mg/dL — ABNORMAL HIGH (ref 70–99)
Glucose-Capillary: 85 mg/dL (ref 70–99)
Glucose-Capillary: 100 mg/dL — ABNORMAL HIGH (ref 70–99)
Glucose-Capillary: 122 mg/dL — ABNORMAL HIGH (ref 70–99)
Glucose-Capillary: 98 mg/dL (ref 70–99)
Glucose-Capillary: 114 mg/dL — ABNORMAL HIGH (ref 70–99)
Glucose-Capillary: 79 mg/dL (ref 70–99)

## 2019-10-27 LAB — COMPREHENSIVE METABOLIC PANEL
ALT: 82 U/L — ABNORMAL HIGH (ref 0–44)
AST: 45 U/L — ABNORMAL HIGH (ref 15–41)
Albumin: 2.9 g/dL — ABNORMAL LOW (ref 3.5–5.0)
Alkaline Phosphatase: 105 U/L (ref 38–126)
Anion gap: 10 (ref 5–15)
BUN: 12 mg/dL (ref 6–20)
CO2: 20 mmol/L — ABNORMAL LOW (ref 22–32)
Calcium: 8.8 mg/dL — ABNORMAL LOW (ref 8.9–10.3)
Chloride: 106 mmol/L (ref 98–111)
Creatinine, Ser: 0.47 mg/dL (ref 0.44–1.00)
GFR calc Af Amer: >60 mL/min (ref >60)
GFR calc non Af Amer: >60 mL/min (ref >60)
Glucose, Bld: 103 mg/dL — ABNORMAL HIGH (ref 70–99)
Potassium: 3.4 mmol/L — ABNORMAL LOW (ref 3.5–5.1)
Sodium: 136 mmol/L (ref 135–145)
Total Bilirubin: 0.6 mg/dL (ref 0.3–1.2)
Total Protein: 6.4 g/dL — ABNORMAL LOW (ref 6.5–8.1)

## 2019-10-27 LAB — CBC
HCT: 32.3 % — ABNORMAL LOW (ref 36.0–46.0)
Hemoglobin: 10.9 g/dL — ABNORMAL LOW (ref 12.0–15.0)
MCH: 26.7 pg (ref 26.0–34.0)
MCHC: 33.7 g/dL (ref 30.0–36.0)
MCV: 79.2 fL — ABNORMAL LOW (ref 80.0–100.0)
Platelets: 276 10*3/uL (ref 150–400)
RBC: 4.08 MIL/uL (ref 3.87–5.11)
RDW: 14.6 % (ref 11.5–15.5)
WBC: 14.4 10*3/uL — ABNORMAL HIGH (ref 4.0–10.5)
nRBC: 0 % (ref 0.0–0.2)

## 2019-10-27 LAB — TYPE AND SCREEN
ABO/RH(D): O POS
Antibody Screen: NEGATIVE

## 2019-10-27 LAB — PROTEIN / CREATININE RATIO, URINE
Creatinine, Urine: 201 mg/dL
Protein Creatinine Ratio: 0.8 mg/mg{Cre} — ABNORMAL HIGH (ref 0.00–0.15)
Total Protein, Urine: 160 mg/dL

## 2019-10-27 LAB — HEMOGLOBIN A1C
Hgb A1c MFr Bld: 5.8 % — ABNORMAL HIGH (ref 4.8–5.6)
Mean Plasma Glucose: 119.76 mg/dL

## 2019-10-27 LAB — GROUP B STREP BY PCR: Group B strep by PCR: NEGATIVE

## 2019-10-27 MED ORDER — OXYTOCIN 40 UNITS IN NORMAL SALINE INFUSION - SIMPLE MED
1.0000 m[IU]/min | INTRAVENOUS | Status: DC
  Administered 2019-10-27: 1 m[IU]/min via INTRAVENOUS
  Filled 2019-10-27: qty 1000

## 2019-10-27 MED ORDER — LABETALOL HCL 5 MG/ML IV SOLN: 20.0000 mg | INTRAVENOUS | Status: DC | PRN

## 2019-10-27 MED ORDER — HYDRALAZINE HCL 20 MG/ML IJ SOLN: 10.0000 mg | INTRAMUSCULAR | Status: DC | PRN

## 2019-10-27 MED ORDER — LABETALOL HCL 5 MG/ML IV SOLN: 80.0000 mg | INTRAVENOUS | Status: DC | PRN

## 2019-10-27 MED ORDER — LACTATED RINGERS IV SOLN
500.0000 mL | INTRAVENOUS | Status: DC | PRN
  Administered 2019-10-27 – 2019-10-28 (×3): 500 mL via INTRAVENOUS

## 2019-10-27 MED ORDER — LACTATED RINGERS IV SOLN
INTRAVENOUS | Status: DC
  Administered 2019-10-27 – 2019-10-28 (×4): via INTRAVENOUS

## 2019-10-27 MED ORDER — MISOPROSTOL 25 MCG QUARTER TABLET
25.0000 ug | ORAL_TABLET | Freq: Once | ORAL | Status: AC
  Administered 2019-10-27: 25 ug via BUCCAL
  Filled 2019-10-27: qty 1

## 2019-10-27 MED ORDER — METOPROLOL SUCCINATE ER 25 MG PO TB24
25.0000 mg | ORAL_TABLET | Freq: Every day | ORAL | Status: DC
  Administered 2019-10-27: 23:00:00 25 mg via ORAL
  Filled 2019-10-27 (×2): qty 1

## 2019-10-27 MED ORDER — DEXTROSE 50 % IV SOLN: 0.0000 mL | INTRAVENOUS | Status: DC | PRN

## 2019-10-27 MED ORDER — LIDOCAINE HCL (PF) 1 % IJ SOLN: 30.0000 mL | INTRAMUSCULAR | Status: DC | PRN

## 2019-10-27 MED ORDER — BUTORPHANOL TARTRATE 1 MG/ML IJ SOLN
1.0000 mg | INTRAMUSCULAR | Status: DC | PRN
  Administered 2019-10-28: 03:00:00 1 mg via INTRAVENOUS
  Filled 2019-10-27: qty 1

## 2019-10-27 MED ORDER — DEXTROSE-NACL 5-0.45 % IV SOLN
INTRAVENOUS | Status: DC
  Administered 2019-10-27 – 2019-10-28 (×3): via INTRAVENOUS

## 2019-10-27 MED ORDER — TERBUTALINE SULFATE 1 MG/ML IJ SOLN: 0.2500 mg | Freq: Once | INTRAMUSCULAR | Status: DC | PRN

## 2019-10-27 MED ORDER — ACETAMINOPHEN 325 MG PO TABS
650.0000 mg | ORAL_TABLET | ORAL | Status: DC | PRN
  Administered 2019-10-27: 650 mg via ORAL
  Filled 2019-10-27: qty 2

## 2019-10-27 MED ORDER — MISOPROSTOL 25 MCG QUARTER TABLET
25.0000 ug | ORAL_TABLET | ORAL | Status: DC | PRN
  Administered 2019-10-27 (×2): 25 ug via VAGINAL
  Filled 2019-10-27 (×2): qty 1

## 2019-10-27 MED ORDER — INSULIN REGULAR(HUMAN) IN NACL 100-0.9 UT/100ML-% IV SOLN
INTRAVENOUS | Status: DC
  Administered 2019-10-27: 0.6 [IU]/h via INTRAVENOUS
  Filled 2019-10-27: qty 100

## 2019-10-27 MED ORDER — OXYTOCIN 40 UNITS IN NORMAL SALINE INFUSION - SIMPLE MED
2.5000 [IU]/h | INTRAVENOUS | Status: DC
  Administered 2019-10-28: 500 mL via INTRAVENOUS
  Administered 2019-10-28: 1000 mL via INTRAVENOUS
  Filled 2019-10-27: qty 1000

## 2019-10-27 MED ORDER — OXYTOCIN BOLUS FROM INFUSION: 500.0000 mL | Freq: Once | INTRAVENOUS | Status: DC

## 2019-10-27 MED ORDER — LABETALOL HCL 5 MG/ML IV SOLN: 40.0000 mg | INTRAVENOUS | Status: DC | PRN

## 2019-10-27 MED ORDER — ONDANSETRON HCL 4 MG/2ML IJ SOLN: 4.0000 mg | Freq: Four times a day (QID) | INTRAMUSCULAR | Status: DC | PRN

## 2019-10-27 MED ORDER — SODIUM CHLORIDE 0.9 % IV SOLN: INTRAVENOUS | Status: DC

## 2019-10-27 NOTE — H&P (Signed)
OB History & Physical   History of Present Illness:  Chief Complaint: here for induction  HPI:  Frances Lamb is a 23 y.o. G2P0010 female at 25w0ddated by 6 week ultrasound.  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  She took her last dose of Toprol XL last night and reports her blood pressures in the past 2 days to be 130s/140s over less than 100. She did not take Metformin this morning. We discussed plan for labor- continuous insulin per cbg and IV anti-hypertensives for severe range BP. She denies headache, visual changes or epigastric pain.  She reports irregular cramping and back pain.   She denies leakage of fluid.   She had some vaginal spotting a couple days ago.   She reports fetal movement.    Total weight gain for pregnancy: 19.5 kg   Obstetrical Problem List: pregnancy2 Problems (from 02/03/19 to present)    Problem Noted Resolved   Pre-eclampsia superimposed on chronic hypertension, antepartum 10/25/2019 by GRod Can CNM No   Gestational diabetes mellitus in pregnancy 09/18/2019 by GRod Can CNM No   Overview Addendum 10/16/2019 11:45 AM by JWill Bonnet MD    Current Diabetic Medications:  Metformin 500 mg before bed (plan to increase to 1,000 mg bid) [ ]  Aspirin 81 mg daily after 12 weeks; discontinue after 36 weeks (? A2/B GDM)  Required Referrals for A1GDM or A2GDM: [x]  Diabetes Education and Testing Supplies [x]  Nutrition Cousult  For A2/B GDM or higher classes of DM [ ]  Diabetes Education and Testing Supplies [ ]  Nutrition Counsult [ ]  Fetal ECHO after 22-24 weeks  [ ]  Eye exam for retina evaluation  [ ]  Baseline EKG [ ]  UKoreafetal growth every 4 weeks starting at 28 weeks [ ]  Twice weekly NST starting at [redacted] weeks gestation [ ]  Delivery  planning contingent on fetal growth, AFI, glycemic control, and other co-morbidities but at least by 39 weeks  Baseline and surveillance labs [facility])  Lab Results  Component Value Date   CREATININE 0.44 09/05/2019   AST 19 09/05/2019   ALT 19 09/05/2019   PROTCRRATIO 0.10 09/05/2019   No results found for: HGBA1C  Antenatal Testing Class of DM U/S NST/AFI DELIVERY  Diabetes   A1 - good control - O24.410    A2 - good control - O24.419      A2  - poor control or poor compliance - O24.419, E11.65   (Macrosomia or polyhydramnios) **E11.65 is extra code for poor control**    A2/B - O24.919  and B-C O24.319  Poor control B-C or D-R-F-T - O24.319  or  Type I DM - O24.019  20-38  20-38  20-24-28-32-36   20-24-28-32-35-38//fetal echo  20-24-27-30-33-36-38//fetal echo  40  32//2 x wk  32//2 x wk   32//2 x wk  28//BPP wkly then 32//2 x wk  40  39  PRN   39  PRN         Chronic hypertension affecting pregnancy 03/14/2019 by HGae Dry MD No   Overview Addendum 10/16/2019 11:45 AM by JWill Bonnet MD    [ ]  Aspirin 81 mg daily after 12 weeks; discontinue after 36 weeks [x]  baseline labs with CBC, CMP, urine protein/creatinine ratio [n/a ] no BP meds unless  BPs become elevated [ ]  ultrasound for growth at 28[x] , 32[x] , 36 weeks [ ]  [x]  Aspirin 81 mg daily after 12 weeks; discontinue after 36 weeks  Current antihypertensives:  metoprolol 25 mg Qdaily   Baseline and surveillance labs (pulled in from [facility], refresh links as needed)  Lab Results  Component Value Date   PLT 275 06/24/2019   CREATININE 0.48 06/24/2019   AST 16 06/24/2019   ALT 15 06/24/2019    Antenatal Testing CHTN - O10.919  Group I  BP < 140/90, no preeclampsia, AGA,  nml AFV, +/- meds    Group II BP > 140/90, on meds, no preeclampsia, AGA, nml AFV  20-28-34-38  20-24-28-32-35-38  32//2 x wk  28//BPP wkly then 32//2 x wk  40 no  meds; 39 meds  PRN or 37  Pre-eclampsia  GHTN - O13.9/Preeclampsia without severe features  - O14.00   Preeclampsia with severe features - O14.10  Q 3-4wks  Q 2 wks  28//BPP wkly then 32//2 x wk  Inpatient  37  PRN or 34         Supervision of high-risk pregnancy 03/14/2019 by Gae Dry, MD No   Overview Addendum 09/18/2019 10:06 AM by Rod Can, [place] Prenatal Labs  Dating [place] 6 weeks Blood type: O/Positive/-- (04/16 1418)   Genetic Screen  NIPS: normal XY Antibody:Negative (04/16 1418)  Anatomic [place]  Rubella: 1.10 (04/16 1418) Varicella: NONIMMUNE  GTT Early:  91              28wk: GDM- [3 hr 2/4 abnml]  RPR: Non Reactive (04/16 1418)   Rhogam O POS HBsAg: Negative (04/16 1418)   TDaP vaccine                  Flu Shot:2019 done; needs fall 2020 HIV: Non Reactive (04/16 1418)   Baby Food    Breast                            GBS:   Contraception  Pap: 2020 NIL  Tob  Encouraged cessation ecigs   CS/VBAC na   Support Person Husband           Bipolar disease during pregnancy in first trimester ([place]) 03/14/2019 by Gae Dry, MD No   Obesity, Class III, BMI 40-49.9 (morbid obesity) ([place]) 03/14/2019 by Gae Dry, MD No   Overview Signed 08/27/2019 11:41 AM by Will Bonnet, MD    BMI >=40 [ ]  early 1h gtt -  [ ]  u/s for dating [ ]   [ ]  nutritional goals [ ]  folic acid 60m [ ]  bASA (>12 weeks) [ ]  consider nutrition consult [ ]  consider maternal EKG 1st trimester [ ]  Growth u/s 28 [ ] , 32 [ ] , 36 weeks [ ]  [ ]  NST/AFI weekly 36+ weeks (36[] , 37[] , 38[] , 39[] , 40[] ) [ ]  IOL by 41 weeks (scheduled, prn [] )           Maternal Medical History:   Past Medical History:  Diagnosis Date  . Anxiety   . Bipolar 2 disorder (HTool   . Depression   . Gestational diabetes   . Hypertension   . Migraine   . Miscarriage     Past Surgical History:  Procedure Laterality Date  . TONSILLECTOMY AND ADENOIDECTOMY      No Known  Allergies  Prior to Admission medications   Medication Sig Start  Date End Date Taking? Authorizing Provider  Blood Glucose Monitoring Suppl (ONETOUCH VERIO FLEX SYSTEM) w/Device KIT Check blood sugar 4 times daily; fasting and 2 hours after each meal 09/18/19   Rod Can, CNM  cyclobenzaprine (FLEXERIL) 10 MG tablet Take 1 tablet (10 mg total) by mouth 2 (two) times daily as needed for muscle spasms. 10/02/19   Rod Can, CNM  DULoxetine (CYMBALTA) 60 MG capsule Take 60 mg by mouth daily.    [provider]  glucose blood test strip Use as instructed 10/02/19   Rod Can, CNM  hydrOXYzine (ATARAX/VISTARIL) 50 MG tablet Take 1 tablet (50 mg total) by mouth every 6 (six) hours as needed for anxiety, itching or nausea. 10/16/19   Will Bonnet, MD  metFORMIN (GLUCOPHAGE) 500 MG tablet Take 1 tablet (500 mg total) by mouth 2 (two) times daily. Start with 516m daily before bedtime for one week, then increase to twice daily 10/16/19   JWill Bonnet MD  metoprolol succinate (TOPROL-XL) 25 MG 24 hr tablet Take 1 tablet (25 mg total) by mouth daily. 07/12/19 07/11/20  HGae Dry MD  ondansetron (ZOFRAN ODT) 4 MG disintegrating tablet Take 1 tablet (4 mg total) by mouth every 6 (six) hours as needed for nausea. 03/27/19   HGae Dry MD  Prenatal Vit-Fe Fumarate-FA (PRENATAL COMPLETE PO) Take 1 tablet by mouth daily.    [provider]    OB History  Gravida Para Term Preterm AB Living  2       1    SAB TAB Ectopic Multiple Live Births  1            # Outcome Date GA Lbr Len/2nd Weight Sex Delivery Anes PTL Lv  2 Current           1 SAB             Prenatal care site: [facility]  Social History: She  reports that she quit smoking about 6 years ago. Her smoking use included cigarettes. She uses smokeless tobacco. She reports previous alcohol use. She reports that she does not use drugs.  Family History: family history includes Diabetes in  her maternal grandfather and mother.   Review of Systems:  Review of Systems  Constitutional: Negative.   HENT: Negative.   Eyes: Negative.   Respiratory: Negative.   Cardiovascular: Negative.   Gastrointestinal: Negative.   Genitourinary: Negative.        Period like cramps  Musculoskeletal: Positive for back pain.  Skin: Negative.   Neurological: Negative.   Endo/Heme/Allergies: Negative.   Psychiatric/Behavioral: Negative.      Physical Exam:  BP (!) 144/75 (BP Location: Left Arm)   Pulse 96   Temp 98.6 F (37 C) (Oral)   Resp 18   LMP 02/03/2019   Constitutional: Well nourished, well developed female in no acute distress.  HEENT: normal Skin: Warm and dry.  Cardiovascular: Regular rate and rhythm.   Extremity: trace edema  Respiratory: Clear to auscultation bilateral. Normal respiratory effort Abdomen: FHT present Back: no CVAT Neuro: DTRs 2+, Cranial nerves grossly intact Psych: Alert and Oriented x3. No memory deficits. Normal mood and affect.  MS: normal gait, normal bilateral lower extremity ROM/strength/stability.  Pelvic exam: (female chaperone present) is not limited by body habitus EGBUS: within normal limits Vagina: within normal limits and with normal mucosa, blood in the vault after cervical sweep Cervix: 1.5/40-50/-2 to -3, soft, between posterior and midline   Pertinent Results:  Prenatal Labs Blood type/Rh O positive  Antibody screen negative  Rubella Immune  Varicella Not immune    RPR Non-reactive  HBsAg negative  HIV negative  GC negative  Chlamydia negative  Genetic screening Negative/female  1 hour GTT Early normal/28 wk 208  3 hour GTT 2/4 elevated, GDM on Metformin  GBS negative on 10/27/19   Baseline FHR: 150 beats/min   Variability: moderate   Accelerations: present   Decelerations: absent Contractions: absent  Overall assessment: Reassuring   Lab Results  Component Value Date   Bond NEGATIVE 10/24/2019   ]  Assessment:  Frances Lamb is a 23 y.o. G71P0010 female at 58w0dwith CHTN superimposed with preeclampsia, GDMA2.   Plan:  1. Admit to Labor & Delivery  2. CBC, T&S, , CMP, PC ratio, HGBA1C, Clrs, IVF 3. GBS negative by PCR.   4. Fetal well-being: Category I 5. Preeclampsia focused orders with IV Labetalol PRN for severe range BP 6. Endo Tool for intrapartum GDMA2 management 7. Begin induction with cytotec: 25 mcg per vagina and 25 mcg buccal   JRod Can CNM 10/27/2019 9:11 AM

## 2019-10-27 NOTE — Progress Notes (Signed)
Staff RN in L & D called about continuing IV insulin drip on patient since it was telling nurse that she had met criteria. Told nurse to continue drip through delivery. Alarm may only sound every 2 hours at this point. Correct order set in chart. May need Novolog correction scale per physician order after delivery, once IV insulin drip has been stopped.  Harvel Ricks RN BSN CDE Diabetes Coordinator Pager: [phone]  8am-5pm

## 2019-10-27 NOTE — Progress Notes (Signed)
  Labor Progress Note   23 y.o. G2P0010 @ [redacted]w[redacted]d , admitted for  Pregnancy, Labor Management.   Subjective:  Patient feels contractions primarily in her back- heating pad is helping  Objective:  BP (!) 150/82   Pulse 86   Temp 98.6 F (37 C) (Oral)   Resp 16   Ht 5\' 2"  (1.575 m)   Wt 123.8 kg   LMP 02/03/2019   BMI 49.93 kg/m  Abd: gravid, ND, FHT present, mild tenderness on exam Extr: trace edema SVE: CERVIX: 2.5 cm dilated, 70 effaced, -3,-2 station  EFM: FHR: 150 bpm, variability: moderate,  accelerations:  Present,  decelerations:  Absent Toco: Frequency: Every 2-4.5 minutes Labs: I have reviewed the patient's lab results.  GBS done 3 days ago has not resulted. GBS by pcr sent today- results pending   Assessment & Plan:  [id number] @ [redacted]w[redacted]d, admitted for  Pregnancy and Labor/Delivery Management  1. Pain management: heating pad. 2. FWB: FHT category I.  3. ID: GBS pending 4. Labor management: start pitocin  All discussed with patient, see orders   Rod Can, [facility] 10/27/2019  5:18 PM

## 2019-10-28 ENCOUNTER — Encounter: Payer: Self-pay | Admitting: Anesthesiology

## 2019-10-28 ENCOUNTER — Inpatient Hospital Stay: Payer: Managed Care, Other (non HMO) | Admitting: Anesthesiology

## 2019-10-28 ENCOUNTER — Encounter
Admission: EM | Disposition: A | Discharge status: Home / Self Care | Payer: Self-pay | Source: Home / Self Care | Attending: Obstetrics & Gynecology

## 2019-10-28 DIAGNOSIS — F319 Bipolar disorder, unspecified: Secondary | ICD-10-CM

## 2019-10-28 DIAGNOSIS — F418 Other specified anxiety disorders: Secondary | ICD-10-CM

## 2019-10-28 DIAGNOSIS — O99214 Obesity complicating childbirth: Secondary | ICD-10-CM

## 2019-10-28 DIAGNOSIS — O99344 Other mental disorders complicating childbirth: Secondary | ICD-10-CM

## 2019-10-28 LAB — CULTURE, BETA STREP (GROUP B ONLY): Strep Gp B Culture: NEGATIVE

## 2019-10-28 LAB — GLUCOSE, CAPILLARY
Glucose-Capillary: 101 mg/dL — ABNORMAL HIGH (ref 70–99)
Glucose-Capillary: 102 mg/dL — ABNORMAL HIGH (ref 70–99)
Glucose-Capillary: 105 mg/dL — ABNORMAL HIGH (ref 70–99)
Glucose-Capillary: 107 mg/dL — ABNORMAL HIGH (ref 70–99)
Glucose-Capillary: 108 mg/dL — ABNORMAL HIGH (ref 70–99)
Glucose-Capillary: 108 mg/dL — ABNORMAL HIGH (ref 70–99)
Glucose-Capillary: 111 mg/dL — ABNORMAL HIGH (ref 70–99)
Glucose-Capillary: 111 mg/dL — ABNORMAL HIGH (ref 70–99)
Glucose-Capillary: 113 mg/dL — ABNORMAL HIGH (ref 70–99)
Glucose-Capillary: 113 mg/dL — ABNORMAL HIGH (ref 70–99)
Glucose-Capillary: 85 mg/dL (ref 70–99)
Glucose-Capillary: 90 mg/dL (ref 70–99)

## 2019-10-28 LAB — RPR: RPR Ser Ql: NONREACTIVE

## 2019-10-28 SURGERY — Surgical Case
Anesthesia: Epidural

## 2019-10-28 MED ORDER — ONDANSETRON HCL 4 MG/2ML IJ SOLN
INTRAMUSCULAR | Status: DC | PRN
  Administered 2019-10-28: 4 mg via INTRAVENOUS

## 2019-10-28 MED ORDER — FENTANYL CITRATE (PF) 100 MCG/2ML IJ SOLN
INTRAMUSCULAR | Status: AC
  Filled 2019-10-28: qty 2

## 2019-10-28 MED ORDER — EPHEDRINE 5 MG/ML INJ
10.0000 mg | INTRAVENOUS | Status: DC | PRN
  Filled 2019-10-28: qty 2

## 2019-10-28 MED ORDER — BUPIVACAINE HCL (PF) 0.25 % IJ SOLN
INTRAMUSCULAR | Status: DC | PRN
  Administered 2019-10-28: 5 mL via EPIDURAL

## 2019-10-28 MED ORDER — DIPHENHYDRAMINE HCL 50 MG/ML IJ SOLN: 12.5000 mg | INTRAMUSCULAR | Status: DC | PRN

## 2019-10-28 MED ORDER — FENTANYL CITRATE (PF) 100 MCG/2ML IJ SOLN
INTRAMUSCULAR | Status: DC | PRN
  Administered 2019-10-28 (×4): 50 ug via INTRAVENOUS

## 2019-10-28 MED ORDER — LIDOCAINE HCL (PF) 2 % IJ SOLN
INTRAMUSCULAR | Status: DC | PRN
  Administered 2019-10-28: 10 mg via EPIDURAL
  Administered 2019-10-28 (×2): 5 mg via EPIDURAL

## 2019-10-28 MED ORDER — SOD CITRATE-CITRIC ACID 500-334 MG/5ML PO SOLN
ORAL | Status: AC
  Administered 2019-10-28: 30 mL
  Filled 2019-10-28: qty 30

## 2019-10-28 MED ORDER — CEFAZOLIN SODIUM-DEXTROSE 1-4 GM/50ML-% IV SOLN
INTRAVENOUS | Status: DC | PRN
  Administered 2019-10-28: 3 g via INTRAVENOUS

## 2019-10-28 MED ORDER — LIDOCAINE HCL (PF) 2 % IJ SOLN
INTRAMUSCULAR | Status: AC
  Filled 2019-10-28: qty 20

## 2019-10-28 MED ORDER — BUPIVACAINE HCL 0.25 % IJ SOLN
INTRAMUSCULAR | Status: DC | PRN
  Administered 2019-10-28: 13 mL

## 2019-10-28 MED ORDER — PHENYLEPHRINE 40 MCG/ML (10ML) SYRINGE FOR IV PUSH (FOR BLOOD PRESSURE SUPPORT)
80.0000 ug | PREFILLED_SYRINGE | INTRAVENOUS | Status: DC | PRN
  Filled 2019-10-28: qty 10

## 2019-10-28 MED ORDER — PHENYLEPHRINE HCL (PRESSORS) 10 MG/ML IV SOLN
INTRAVENOUS | Status: AC
  Filled 2019-10-28: qty 1

## 2019-10-28 MED ORDER — LACTATED RINGERS IV SOLN
INTRAVENOUS | Status: DC | PRN
  Administered 2019-10-28: 20:00:00 via INTRAVENOUS

## 2019-10-28 MED ORDER — BUPIVACAINE HCL (PF) 0.5 % IJ SOLN
INTRAMUSCULAR | Status: AC
  Filled 2019-10-28: qty 30

## 2019-10-28 MED ORDER — ONDANSETRON HCL 4 MG/2ML IJ SOLN: 4.0000 mg | Freq: Once | INTRAMUSCULAR | Status: DC | PRN

## 2019-10-28 MED ORDER — FENTANYL 2.5 MCG/ML W/ROPIVACAINE 0.15% IN NS 100 ML EPIDURAL (ARMC)
12.0000 mL/h | EPIDURAL | Status: DC
  Administered 2019-10-28 (×3): 12 mL/h via EPIDURAL
  Filled 2019-10-28 (×3): qty 100

## 2019-10-28 MED ORDER — LACTATED RINGERS IV SOLN
500.0000 mL | Freq: Once | INTRAVENOUS | Status: AC
  Administered 2019-10-28: 500 mL via INTRAVENOUS

## 2019-10-28 MED ORDER — FENTANYL CITRATE (PF) 100 MCG/2ML IJ SOLN
25.0000 ug | INTRAMUSCULAR | Status: DC | PRN
  Administered 2019-10-28: 25 ug via INTRAVENOUS
  Filled 2019-10-28: qty 2

## 2019-10-28 MED ORDER — FENTANYL 2.5 MCG/ML W/ROPIVACAINE 0.15% IN NS 100 ML EPIDURAL (ARMC)
EPIDURAL | Status: AC
  Filled 2019-10-28: qty 100

## 2019-10-28 MED ORDER — KETOROLAC TROMETHAMINE 30 MG/ML IJ SOLN
INTRAMUSCULAR | Status: DC | PRN
  Administered 2019-10-28: 30 mg via INTRAVENOUS

## 2019-10-28 MED ORDER — SODIUM BICARBONATE 8.4 % IV SOLN
INTRAVENOUS | Status: AC
  Filled 2019-10-28: qty 50

## 2019-10-28 MED ORDER — PHENYLEPHRINE HCL (PRESSORS) 10 MG/ML IV SOLN
INTRAVENOUS | Status: DC | PRN
  Administered 2019-10-28 (×2): 300 ug via INTRAVENOUS
  Administered 2019-10-28 (×2): 200 ug via INTRAVENOUS

## 2019-10-28 SURGICAL SUPPLY — 26 items
BARRIER ADHS 3X4 INTERCEED (GAUZE/BANDAGES/DRESSINGS) ×2 IMPLANT
CANISTER SUCT 3000ML PPV (MISCELLANEOUS) ×2 IMPLANT
CATH KIT ON-Q SILVERSOAK 5IN (CATHETERS) ×4 IMPLANT
CHLORAPREP W/TINT 26 (MISCELLANEOUS) ×4 IMPLANT
COVER WAND RF STERILE (DRAPES) ×2 IMPLANT
DERMABOND ADVANCED (GAUZE/BANDAGES/DRESSINGS) ×1
DERMABOND ADVANCED .7 DNX12 (GAUZE/BANDAGES/DRESSINGS) ×1 IMPLANT
ELECT CAUTERY BLADE 6.4 (BLADE) ×2 IMPLANT
ELECT REM PT RETURN 9FT ADLT (ELECTROSURGICAL) ×2
ELECTRODE REM PT RTRN 9FT ADLT (ELECTROSURGICAL) ×1 IMPLANT
GLOVE SKINSENSE NS SZ8.0 LF (GLOVE) ×1
GLOVE SKINSENSE STRL SZ8.0 LF (GLOVE) ×1 IMPLANT
GOWN STRL REUS W/ TWL LRG LVL3 (GOWN DISPOSABLE) ×1 IMPLANT
GOWN STRL REUS W/ TWL XL LVL3 (GOWN DISPOSABLE) ×2 IMPLANT
GOWN STRL REUS W/TWL LRG LVL3 (GOWN DISPOSABLE) ×1
GOWN STRL REUS W/TWL XL LVL3 (GOWN DISPOSABLE) ×2
NEEDLE HYPO 22GX1.5 SAFETY (NEEDLE) ×2 IMPLANT
NS IRRIG 1000ML POUR BTL (IV SOLUTION) ×2 IMPLANT
PACK C SECTION AR (MISCELLANEOUS) ×2 IMPLANT
PAD OB MATERNITY 4.3X12.25 (PERSONAL CARE ITEMS) ×2 IMPLANT
PAD PREP 24X41 OB/GYN DISP (PERSONAL CARE ITEMS) ×2 IMPLANT
PENCIL SMOKE ULTRAEVAC 22 CON (MISCELLANEOUS) ×2 IMPLANT
SUT MAXON ABS #0 GS21 30IN (SUTURE) ×4 IMPLANT
SUT VIC AB 1 CT1 36 (SUTURE) ×6 IMPLANT
SUT VIC AB 2-0 CT1 36 (SUTURE) ×2 IMPLANT
SUT VIC AB 4-0 FS2 27 (SUTURE) ×4 IMPLANT

## 2019-10-28 NOTE — Anesthesia Preprocedure Evaluation (Addendum)
Anesthesia Evaluation  Patient identified by MRN, date of birth, ID band Patient awake    Reviewed: Allergy & Precautions, NPO status , Patient's Chart, lab work & pertinent test results, reviewed documented beta blocker date and time   Airway Mallampati: III  TM Distance: >3 FB     Dental  (+) Chipped   Pulmonary former smoker,           Cardiovascular hypertension, Pt. on medications and Pt. on home beta blockers      Neuro/Psych  Headaches, PSYCHIATRIC DISORDERS Anxiety Depression Bipolar Disorder    GI/Hepatic   Endo/Other  diabetes, Type 2Morbid obesity  Renal/GU      Musculoskeletal   Abdominal   Peds  Hematology   Anesthesia Other Findings   Reproductive/Obstetrics                             Anesthesia Physical Anesthesia Plan  ASA: III  Anesthesia Plan: Epidural   Post-op Pain Management:    Induction: Intravenous  PONV Risk Score and Plan:   Airway Management Planned:   Additional Equipment:   Intra-op Plan:   Post-operative Plan:   Informed Consent: I have reviewed the patients History and Physical, chart, labs and discussed the procedure including the risks, benefits and alternatives for the proposed anesthesia with the patient or authorized representative who has indicated his/her understanding and acceptance.       Plan Discussed with: CRNA  Anesthesia Plan Comments:        Anesthesia Quick Evaluation

## 2019-10-28 NOTE — Progress Notes (Signed)
  Labor Progress Note   23 y.o. G2P0010 @ [redacted]w[redacted]d , admitted for  Pregnancy, Labor Management.   Subjective:  Feels stronger contractions every few minutes. Would like IV pain medicine soon.  Objective:  BP (!) 155/94   Pulse 86   Temp 98.8 F (37.1 C) (Oral)   Resp 17   Ht 5\' 2"  (1.575 m)   Wt 123.8 kg   LMP 02/03/2019   SpO2 100%   BMI 49.93 kg/m  Abd: gravid, FHTs present Extr: trace edema SVE: CERVIX: 3.5 cm dilated, 80 effaced, -2 station  AROM clear  EFM: FHR: 150 bpm, variability: moderate,  accelerations:  Present,  decelerations:  Absent Toco: Frequency: Every 2-3 minutes Labs: I have reviewed the patient's lab results.   Assessment & Plan:  G2P0010 @ [redacted]w[redacted]d, admitted for  Pregnancy and Labor/Delivery Management  1. Pain management: none. 2. FWB: FHT category I.  3. ID: GBS negative 4. Labor management: continue pitocin  All discussed with patient, see orders   Rod Can, [facility] 10/28/2019  2:12 AM

## 2019-10-28 NOTE — Progress Notes (Signed)
  Labor Progress Note   23 y.o. G2P0010 @ [redacted]w[redacted]d , admitted for  Pregnancy, Labor Management.   Subjective:  Some pressure Pt has been 9 cm on Pitocin >20 mU/min with 150-180 mVU for >3 hours  Objective:  BP (!) 149/70   Pulse (!) 108   Temp 98.7 F (37.1 C) (Oral)   Resp 17   Ht 5\' 2"  (1.575 m)   Wt 123.8 kg   LMP 02/03/2019   SpO2 98%   BMI 49.93 kg/m  Abd: gravid, ND, FHT present, mild tenderness on exam Extr: trace to 1+ bilateral pedal edema SVE: CERVIX: 9 cm dilated, 90 effaced, 0 station  EFM: FHR: 130 bpm, variability: moderate,  accelerations:  Present,  decelerations:  Absent Toco: Frequency: Every 2-4 minutes Labs: I have reviewed the patient's lab results.   Assessment & Plan:  G2P0010 @ [redacted]w[redacted]d, admitted for  Pregnancy and Labor/Delivery Management  1. Pain management: epidural. 2. FWB: FHT category 1.  3. ID: GBS negative 4. Labor management: Plan CS for arrest of dilation due to CPD  The risks of cesarean section discussed with the patient included but were not limited to: bleeding which may require transfusion or reoperation; infection which may require antibiotics; injury to bowel, bladder, ureters or other surrounding organs; injury to the fetus; need for additional procedures including hysterectomy in the event of a life-threatening hemorrhage; placental abnormalities wth subsequent pregnancies, incisional problems, thromboembolic phenomenon and other postoperative/anesthesia complications. The patient concurred with the proposed plan, giving informed written consent for the procedure.    All discussed with patient, see orders  Barnett Applebaum, MD, [facility], [facility] 10/28/2019  7:39 PM

## 2019-10-28 NOTE — Progress Notes (Signed)
  Labor Progress Note   23 y.o. G2P0010 @ [redacted]w[redacted]d , admitted for  Pregnancy, Labor Management.   Subjective:  Called to see patient for pain unrelieved by epidural. Epidural placement was difficult. She was asking for c/section. She is making good progress on exam. I turned off pitocin and got her out of bed onto birthing ball. Dr Kenton Kingfisher called to evaluate. Patient is much more comfortable since above measures taken. Dr Kenton Kingfisher is also encouraging patient to continue as she is to deliver vaginally. Patient is much calmer and able to continue at this time.   Objective:  BP (!) 156/83 (BP Location: Right Arm)   Pulse (!) 110   Temp 98.6 F (37 C) (Oral)   Resp 15   Ht 5\' 2"  (1.575 m)   Wt 123.8 kg   LMP 02/03/2019   SpO2 97%   BMI 49.93 kg/m  Abd: gravid, ND, FHT present, mild tenderness on exam Extr: trace edema SVE: CERVIX: 7-8 cm dilated, 90 effaced, -1 station  EFM: FHR: 155 bpm, variability: moderate,  accelerations:  Present,  decelerations:  Absent  Intermittent monitoring since pitocin turned off and on birthing ball Toco: Frequency: Every 2-4 minutes Labs: I have reviewed the patient's lab results.   Assessment & Plan:  G2P0010 @ [redacted]w[redacted]d, admitted for  Pregnancy and Labor/Delivery Management  1. Pain management: epidural placed without relief. 2. FWB: FHT category I.  3. ID: GBS negative 4. Labor management: continue expectant management, re-evaluate for cervical change  All discussed with patient, see orders   Rod Can, [facility] 10/28/2019  7:41 AM

## 2019-10-28 NOTE — Op Note (Signed)
Cesarean Section Procedure Note Indications: Gestational Diabetes, Hypertension complicating pregnancy, Failure to progress due to arrest of cervical dilation w ith CPD, 37 weeks pregnancy  Pre-operative Diagnosis:Gestational Diabetes, Hypertension complicating pregnancy, Failure to progress due to arrest of cervical dilation w ith CPD, 37 weeks pregnancy Post-operative Diagnosis: same, delivered. Procedure: Low Transverse Cesarean Section Surgeon: Barnett Applebaum, MD, FACOG Assistant(s): Hassan Buckler, NCM, No other capable assistant available, in surgery requiring high level assistant. Anesthesia: Epidural anesthesia Estimated Blood [id number] Complications: None; patient tolerated the procedure well. Disposition: PACU - hemodynamically stable. Condition: stable  Findings: A female infant in the [place] presentation. "[name]" Amniotic fluid - Clear  Birth weight 6-12lbs.  Apgars of 9 and 9.  Intact placenta with a three-vessel cord. Grossly normal uterus, tubes and ovaries bilaterally. No intraabdominal adhesions were noted.  Procedure Details   The patient was taken to Operating Room, identified as the correct patient and the procedure verified as C-Section Delivery. A Time Out was held and the above information confirmed. After induction of anesthesia, the patient was draped and prepped in the usual sterile manner. A Pfannenstiel incision was made and carried down through the subcutaneous tissue to the fascia. Fascial incision was made and extended transversely with the Mayo scissors. The fascia was separated from the underlying rectus tissue superiorly and inferiorly. The peritoneum was identified and entered bluntly. Peritoneal incision was extended longitudinally. The utero-vesical peritoneal reflection was incised transversely and a bladder flap was created digitally.  A low transverse hysterotomy was made. The fetus was delivered atraumatically. The umbilical cord was clamped x2 and cut  and the infant was handed to the awaiting pediatricians. The placenta was removed intact and appeared normal with a 3-vessel cord.  The uterus was exteriorized and cleared of all clot and debris. The hysterotomy was closed with running sutures of 0 Vicryl suture. A second imbricating layer was placed with the same suture. Excellent hemostasis was observed. The uterus was returned to the abdomen. The pelvis was irrigated and again, excellent hemostasis was noted. Interceed placed over uterine incision closure. The On Q Pain pump System was then placed.  Trocars were placed through the abdominal wall into the subfascial space and these were used to thread the silver soaker cathaters into place.The rectus fascia was then reapproximated with running sutures of Maxon, with careful placement not to incorporate the cathaters. Subcutaneous tissues are then irrigated with saline and hemostasis assured.  A subcytaneous layer with plain gut suture was placed.  Skin is then closed with 4-0 vicryl suture in a subcuticular fashion followed by skin adhesive. The cathaters are flushed each with 5 mL of Bupivicaine and stabilized into place with dressing. Instrument, sponge, and needle counts were correct prior to the abdominal closure and at the conclusion of the case.  The patient tolerated the procedure well and was transferred to the recovery room in stable condition.   Barnett Applebaum, MD, [facility], [facility] 10/28/2019  9:34 PM

## 2019-10-28 NOTE — Progress Notes (Signed)
  Labor Progress Note   23 y.o. G2P0010 @ [redacted]w[redacted]d , admitted for  Pregnancy, Labor Management.   Subjective:  Pain w pressure   Objective:  BP 135/84   Pulse 94   Temp 98.7 F (37.1 C) (Oral)   Resp 20   Ht 5\' 2"  (1.575 m)   Wt 123.8 kg   LMP 02/03/2019   SpO2 98%   BMI 49.93 kg/m  Abd: gravid, ND, FHT present, moderate tenderness on exam Extr: trace to 1+ bilateral pedal edema SVE: CERVIX: 6-7 cm dilated, 70 effaced, -3 station  EFM: FHR: 150 bpm, variability: moderate,  accelerations:  Present,  decelerations:  Absent Toco: Frequency: Every 3-6 minutes Labs: I have reviewed the patient's lab results.   Assessment & Plan:  G2P0010 @ [redacted]w[redacted]d, admitted for  Pregnancy and Labor/Delivery Management Arrest of descent/dilation at 6-7 cm.  1. Pain management: epidural. 2. FWB: FHT category 1.  3. ID: GBS negative 4. Labor management: Discussed restarting Pitocin vs CS for arrest of descent. Will restart Pitocin and see if we can have progress soon  All discussed with patient, see orders  Barnett Applebaum, MD, [facility], [facility] 10/28/2019  9:52 AM

## 2019-10-28 NOTE — Progress Notes (Signed)
  Labor Progress Note   23 y.o. G2P0010 @ [redacted]w[redacted]d , admitted for  Pregnancy, Labor Management.   Subjective:  Pain improved with redo of epidural  Objective:  BP 116/72   Pulse 89   Temp 98.1 F (36.7 C) (Oral)   Resp 16   Ht 5\' 2"  (1.575 m)   Wt 123.8 kg   LMP 02/03/2019   SpO2 100%   BMI 49.93 kg/m  Abd: gravid, ND, FHT present, without guarding, without rebound tenderness on exam Extr: trace to 1+ bilateral pedal edema SVE: CERVIX: 8 cm dilated, 90 effaced, -1 station  EFM: FHR: 140 bpm, variability: moderate,  accelerations:  Present,  decelerations:  Absent, she has had some early decels with some ctxs She has one late decel after recent exam and in supine positioning for foley placement, recovery Toco: Frequency: Every 4-5 minutes Labs: I have reviewed the patient's lab results.   Assessment & Plan:  G2P0010 @ [redacted]w[redacted]d, admitted for  Pregnancy and Labor/Delivery Management  1. Pain management: epidural. 2. FWB: FHT category 1.  3. ID: GBS negative 4. Labor management: IUPC and FSE have been placed Pitocin  Hopeful cervical change, fetal tolerating, continue labor  All discussed with patient, see orders  Barnett Applebaum, MD, [facility], [facility] 10/28/2019  1:43 PM

## 2019-10-28 NOTE — Transfer of Care (Signed)
Immediate Anesthesia Transfer of Care Note  Patient: Frances Lamb  Procedure(s) Performed: CESAREAN SECTION (N/A )  Patient Location: PACU in L&D  Anesthesia Type:Epidural  Level of Consciousness: awake, alert  and oriented  Airway & Oxygen Therapy: Patient Spontanous Breathing  Post-op Assessment: Report given to RN and Post -op Vital signs reviewed and stable  Post vital signs: Reviewed and stable  Last Vitals:  Vitals Value Taken Time  BP    Temp    Pulse    Resp    SpO2      Last Pain:  Vitals:   10/28/19 1907  TempSrc: Oral  PainSc: 1       Patients Stated Pain Goal: 0 (81/15/72 6203)  Complications: No apparent anesthesia complications

## 2019-10-28 NOTE — Anesthesia Procedure Notes (Signed)
Epidural Patient location during procedure: OB  Staffing Anesthesiologist: Orra Nolde, MD Performed: anesthesiologist   Preanesthetic Checklist Completed: patient identified, site marked, surgical consent, pre-op evaluation, timeout performed, IV checked, risks and benefits discussed and monitors and equipment checked  Epidural Patient position: sitting Prep: ChloraPrep Patient monitoring: heart rate, continuous pulse ox and blood pressure Approach: midline Location: L4-L5 Injection technique: LOR saline  Needle:  Needle type: Tuohy  Needle gauge: 18 G Needle length: 9 cm and 9 Catheter type: closed end flexible Catheter size: 20 Guage Test dose: negative and 1.5% lidocaine with Epi 1:200 K  Assessment Sensory level: T10 Events: blood not aspirated, injection not painful, no injection resistance, negative IV test and no paresthesia  Additional Notes   Patient tolerated the insertion well without complications.Reason for block:procedure for pain     

## 2019-10-28 NOTE — Anesthesia Post-op Follow-up Note (Signed)
Anesthesia QCDR form completed.        

## 2019-10-28 NOTE — Discharge Summary (Signed)
OB Discharge Summary     Patient Name: Frances Lamb DOB: June 21, 1996 MRN: [id number]  Date of admission: 10/27/2019 Delivering MD: Hoyt Koch, MD  Date of Delivery: 10/28/2019  Date of discharge: 10/31/2019  Admitting diagnosis: here induction 3 wks, Gestational Diabetes, Chronic hypertension with superimposed preeclampsia,  Intrauterine pregnancy: [redacted]w[redacted]d        Discharge diagnosis: Term Pregnancy Delivered, Failed induction of labor and Reasons for cesarean section  Arrest of Dilation and Other  CPD          Gestational Diabetes, Chronic hypertension with superimposed preeclampsia, Failure to progress due to arrest of cervical dilation w ith CPD, 37 weeks pregnancy. Morbid obesity with BMI 49.93 kg/m2            Hospital course:  Induction of Labor With Cesarean Section  23y.o. yo G2P0010 at 25w1das admitted to the hospital 10/27/2019 for induction of labor. Patient had a labor course significant for Cytotec, Pitocin, AROM, epidural; labor to 9 cm and then arrest. The patient went for cesarean section due to Arrest of Dilation, and delivered a Viable 6#11.6oz female infant,10/28/2019  Membrane Rupture Time/Date: 1:58 AM ,10/28/2019  "[name]" Details of operation can be found in separate operative Note.  Patient had a postpartum course remarkable for asymptomatic anemia with discharge hemoglobin of 8.2 gm/dl.and with elevated blood pressures postpartum. She is being discharged on iron and vitamin supplements, Norvasc 5 mgm daily in AM and metaprolol 50 mgm daily in PM.  She is ambulating, tolerating a regular diet, passing flatus, and urinating well. Has also been on DVT prophylaxis (Lovenox) and will continue Lovenox at home for 3-6 weeks.   Patient is discharged home in stable condition on 10/31/19.                                                                                                   Post partum procedures:none  Complications: [id number] exam on  10/31/2019: Vitals:   10/30/19 2303 10/31/19 0300 10/31/19 0816 10/31/19 1036  BP: 126/74 132/84 (!) 158/109 (!) 152/95  Pulse: 100 93 94 86  Resp: _0 Temp: 98.2 F (36.8 C) 98 F (36.7 C) 98.5 F (36.9 C)   TempSrc: Oral Oral Oral   SpO2: 98% 99% 99%   Weight:      Height:       General: alert, cooperative and no distress . Has a little occipital headache when up ambulating and it resolves when she lies down. No lightheadedness when OOB Heart: RRR without murmur Lungs: CTAB/ normal respiratory effort Lochia: appropriate Abdomen: bowel sounds active, soft, non distended Incision: Dressing is clean, dry, and intact. ON Q is intact DVT Evaluation: No evidence of DVT seen on physical exam.  Labs: Lab Results  Component Value Date   WBC 15.6 (H) 10/30/2019   HGB 8.2 (L) 10/30/2019   HCT 25.0 (L) 10/30/2019   MCV 81.2 10/30/2019   PLT 247 10/30/2019   CMP Latest Ref Rng & Units 10/29/2019  Glucose 70 - 99 mg/dL -  BUN 6 -  20 mg/dL -  Creatinine 0.44 - 1.00 mg/dL 0.54  Sodium 135 - 145 mmol/L -  Potassium 3.5 - 5.1 mmol/L -  Chloride 98 - 111 mmol/L -  CO2 22 - 32 mmol/L -  Calcium 8.9 - 10.3 mg/dL -  Total Protein 6.5 - 8.1 g/dL -  Total Bilirubin 0.3 - 1.2 mg/dL -  Alkaline Phos 38 - 126 U/L -  AST 15 - 41 U/L -  ALT 0 - 44 U/L -    Discharge instruction: per After Visit Summary.  Medications:  Allergies as of 10/31/2019   No Known Allergies     Medication List    STOP taking these medications   cyclobenzaprine 10 MG tablet Commonly known as: FLEXERIL   glucose blood test strip   metFORMIN 500 MG tablet Commonly known as: GLUCOPHAGE   OneTouch Verio Flex System w/Device Kit     TAKE these medications   acetaminophen 500 MG tablet Commonly known as: TYLENOL Take 2 tablets (1,000 mg total) by mouth every 6 (six) hours as needed.   amLODipine 5 MG tablet Commonly known as: NORVASC Take 1 tablet (5 mg total) by mouth daily.   docusate sodium  100 MG capsule Commonly known as: Colace Take 1 capsule (100 mg total) by mouth 2 (two) times daily as needed for mild constipation or moderate constipation.   DULoxetine 60 MG capsule Commonly known as: CYMBALTA Take 60 mg by mouth daily.   enoxaparin 60 MG/0.6ML injection Commonly known as: LOVENOX Inject 0.6 mLs (60 mg total) into the skin daily.   ferrous sulfate 325 (65 FE) MG tablet Take 1 tablet (325 mg total) by mouth 2 (two) times daily with a meal.   hydrOXYzine 50 MG tablet Commonly known as: ATARAX/VISTARIL Take 1 tablet (50 mg total) by mouth at bedtime as needed (sleep). What changed:   when to take this  reasons to take this   metoprolol succinate 25 MG 24 hr tablet Commonly known as: TOPROL-XL Take two tablets every evening What changed:   how much to take  how to take this  when to take this  additional instructions   norethindrone 0.35 MG tablet Commonly known as: Errin Take 1 tablet (0.35 mg total) by mouth daily. Start taking on: November 25, 2019   ondansetron 4 MG disintegrating tablet Commonly known as: Zofran ODT Take 1 tablet (4 mg total) by mouth every 6 (six) hours as needed for nausea.   oxyCODONE 5 MG immediate release tablet Commonly known as: Roxicodone Take 1 tablet (5 mg total) by mouth every 6 (six) hours as needed for up to 7 days for moderate pain or severe pain.   PRENATAL COMPLETE PO Take 1 tablet by mouth daily.       Diet: routine diet  Activity: Advance as tolerated. Pelvic rest for 6 weeks.   Outpatient follow up: Follow-up Information    Dalia Heading, CNM. Go on 11/02/2019.   Specialty: Certified Nurse Midwife Why: at 1330 (1:30 PM) for a blood pressure check Contact information: [place] [address] [phone]        Gae Dry, MD. Schedule an appointment as soon as possible for a visit in 6 week(s).   Specialty: Obstetrics and Gynecology Why: Please call to schedule  an appointment in 6 weeks for postpartum visit Contact information: [address] [place] [address] [phone]             Postpartum contraception: Progesterone only pills Rhogam  Given postpartum: no Rubella vaccine given postpartum: no Varicella vaccine given postpartum: yes TDaP given antepartum or postpartum: Yes  Newborn Data: Live born female [name] Birth Weight: 6 lb 11.6 oz (3050 g) APGAR: 9, 9  Newborn Delivery   Birth date/time: 10/28/2019 20:44:00 Delivery type: C-Section, Low Transverse Trial of labor: Yes C-section categorization: Primary       Baby Feeding: Bottle and Breast  Disposition:home with mother  SIGNED: Dalia Heading, CNM 10/31/2019 12:44 PM

## 2019-10-29 ENCOUNTER — Encounter: Payer: BC Managed Care – PPO | Admitting: Obstetrics and Gynecology

## 2019-10-29 LAB — CBC
HCT: 25.5 % — ABNORMAL LOW (ref 36.0–46.0)
Hemoglobin: 8.7 g/dL — ABNORMAL LOW (ref 12.0–15.0)
MCH: 26.8 pg (ref 26.0–34.0)
MCHC: 34.1 g/dL (ref 30.0–36.0)
MCV: 78.5 fL — ABNORMAL LOW (ref 80.0–100.0)
Platelets: 240 10*3/uL (ref 150–400)
RBC: 3.25 MIL/uL — ABNORMAL LOW (ref 3.87–5.11)
RDW: 14.3 % (ref 11.5–15.5)
WBC: 19 10*3/uL — ABNORMAL HIGH (ref 4.0–10.5)
nRBC: 0 % (ref 0.0–0.2)

## 2019-10-29 LAB — CREATININE, SERUM
Creatinine, Ser: 0.54 mg/dL (ref 0.44–1.00)
GFR calc Af Amer: >60 mL/min (ref >60)
GFR calc non Af Amer: >60 mL/min (ref >60)

## 2019-10-29 MED ORDER — DIBUCAINE (PERIANAL) 1 % EX OINT: 1.0000 "application " | TOPICAL_OINTMENT | CUTANEOUS | Status: DC | PRN

## 2019-10-29 MED ORDER — PRENATAL MULTIVITAMIN CH
1.0000 | ORAL_TABLET | Freq: Every day | ORAL | Status: DC
  Administered 2019-10-29 – 2019-10-31 (×3): 1 via ORAL
  Filled 2019-10-29 (×3): qty 1

## 2019-10-29 MED ORDER — BUPIVACAINE ON-Q PAIN PUMP (FOR ORDER SET NO CHG)
INJECTION | Status: DC
  Filled 2019-10-29 (×3): qty 1

## 2019-10-29 MED ORDER — SIMETHICONE 80 MG PO CHEW
80.0000 mg | CHEWABLE_TABLET | ORAL | Status: DC | PRN
  Administered 2019-10-29: 05:00:00 80 mg via ORAL
  Filled 2019-10-29 (×2): qty 1

## 2019-10-29 MED ORDER — OXYCODONE-ACETAMINOPHEN 5-325 MG PO TABS
1.0000 | ORAL_TABLET | ORAL | Status: DC | PRN
  Administered 2019-10-29: 1 via ORAL
  Filled 2019-10-29: qty 1

## 2019-10-29 MED ORDER — MENTHOL 3 MG MT LOZG
1.0000 | LOZENGE | OROMUCOSAL | Status: DC | PRN
  Filled 2019-10-29: qty 9

## 2019-10-29 MED ORDER — DIPHENHYDRAMINE HCL 25 MG PO CAPS: 25.0000 mg | ORAL_CAPSULE | Freq: Four times a day (QID) | ORAL | Status: DC | PRN

## 2019-10-29 MED ORDER — FERROUS SULFATE 325 (65 FE) MG PO TABS
325.0000 mg | ORAL_TABLET | Freq: Two times a day (BID) | ORAL | Status: DC
  Administered 2019-10-30 – 2019-10-31 (×3): 325 mg via ORAL
  Filled 2019-10-29 (×3): qty 1

## 2019-10-29 MED ORDER — KETOROLAC TROMETHAMINE 30 MG/ML IJ SOLN
30.0000 mg | Freq: Four times a day (QID) | INTRAMUSCULAR | Status: AC
  Administered 2019-10-29 (×4): 30 mg via INTRAVENOUS
  Filled 2019-10-29 (×4): qty 1

## 2019-10-29 MED ORDER — VARICELLA VIRUS VACCINE LIVE 1350 PFU/0.5ML IJ SUSR
0.5000 mL | Freq: Once | INTRAMUSCULAR | Status: AC
  Administered 2019-10-31: 0.5 mL via SUBCUTANEOUS
  Filled 2019-10-29 (×2): qty 0.5

## 2019-10-29 MED ORDER — WITCH HAZEL-GLYCERIN EX PADS: 1.0000 "application " | MEDICATED_PAD | CUTANEOUS | Status: DC | PRN

## 2019-10-29 MED ORDER — ACETAMINOPHEN 500 MG PO TABS
1000.0000 mg | ORAL_TABLET | Freq: Four times a day (QID) | ORAL | Status: DC
  Administered 2019-10-29 – 2019-10-31 (×9): 1000 mg via ORAL
  Filled 2019-10-29 (×10): qty 2

## 2019-10-29 MED ORDER — LACTATED RINGERS IV SOLN: INTRAVENOUS | Status: DC

## 2019-10-29 MED ORDER — DULOXETINE HCL 60 MG PO CPEP
60.0000 mg | ORAL_CAPSULE | Freq: Every day | ORAL | Status: DC
  Administered 2019-10-29 – 2019-10-31 (×3): 60 mg via ORAL
  Filled 2019-10-29 (×4): qty 1

## 2019-10-29 MED ORDER — ENOXAPARIN SODIUM 40 MG/0.4ML ~~LOC~~ SOLN
40.0000 mg | Freq: Two times a day (BID) | SUBCUTANEOUS | Status: DC
  Administered 2019-10-29 – 2019-10-31 (×4): 40 mg via SUBCUTANEOUS
  Filled 2019-10-29 (×4): qty 0.4

## 2019-10-29 MED ORDER — OXYTOCIN 40 UNITS IN NORMAL SALINE INFUSION - SIMPLE MED: 2.5000 [IU]/h | INTRAVENOUS | Status: AC

## 2019-10-29 MED ORDER — COCONUT OIL OIL
1.0000 "application " | TOPICAL_OIL | Status: DC | PRN
  Administered 2019-10-29: 1 via TOPICAL
  Filled 2019-10-29: qty 120

## 2019-10-29 MED ORDER — SIMETHICONE 80 MG PO CHEW
80.0000 mg | CHEWABLE_TABLET | ORAL | Status: DC
  Administered 2019-10-29 – 2019-10-30 (×2): 80 mg via ORAL
  Filled 2019-10-29 (×2): qty 1

## 2019-10-29 MED ORDER — SIMETHICONE 80 MG PO CHEW
80.0000 mg | CHEWABLE_TABLET | Freq: Three times a day (TID) | ORAL | Status: DC
  Administered 2019-10-29 – 2019-10-31 (×6): 80 mg via ORAL
  Filled 2019-10-29 (×7): qty 1

## 2019-10-29 MED ORDER — METOPROLOL SUCCINATE ER 25 MG PO TB24
25.0000 mg | ORAL_TABLET | Freq: Every day | ORAL | Status: DC
  Administered 2019-10-29 – 2019-10-30 (×2): 25 mg via ORAL
  Filled 2019-10-29 (×4): qty 1

## 2019-10-29 MED ORDER — SENNOSIDES-DOCUSATE SODIUM 8.6-50 MG PO TABS
2.0000 | ORAL_TABLET | ORAL | Status: DC
  Administered 2019-10-29 – 2019-10-31 (×3): 2 via ORAL
  Filled 2019-10-29 (×3): qty 2

## 2019-10-29 MED ORDER — MORPHINE SULFATE (PF) 2 MG/ML IV SOLN: 1.0000 mg | INTRAVENOUS | Status: DC | PRN

## 2019-10-29 MED ORDER — TETANUS-DIPHTH-ACELL PERTUSSIS 5-2.5-18.5 LF-MCG/0.5 IM SUSP: 0.5000 mL | Freq: Once | INTRAMUSCULAR | Status: DC

## 2019-10-29 MED ORDER — ZOLPIDEM TARTRATE 5 MG PO TABS: 5.0000 mg | ORAL_TABLET | Freq: Every evening | ORAL | Status: DC | PRN

## 2019-10-29 NOTE — Anesthesia Postprocedure Evaluation (Signed)
Anesthesia Post Note  Patient: Frances Lamb  Procedure(s) Performed: CESAREAN SECTION (N/A )  Anesthesia Type: Epidural and Spinal     Last Vitals:  Vitals:   10/29/19 0124 10/29/19 0330  BP: (!) 144/84 122/79  Pulse: 92 95  Resp: 20 20  Temp: 37 C 36.8 C  SpO2: 98% 97%    Last Pain:  Vitals:   10/29/19 0722  TempSrc:   PainSc: 4                  Blima Singer

## 2019-10-29 NOTE — Progress Notes (Signed)
CSW aware that MOB scored 8 on [place] with answering "1" to question number 10. CSW attempting to call into MOB's room-with no answer at this time. CSW to keep trying.    Virgie Dad Kalum Minner, MSW, LCSW [facility] [facility] [place] [phone]

## 2019-10-29 NOTE — Progress Notes (Signed)
PHARMACIST - PHYSICIAN COMMUNICATION  CONCERNING:  Enoxaparin (Lovenox) for DVT Prophylaxis    RECOMMENDATION: Patient was prescribed enoxaprin 40mg  q24 hours for VTE prophylaxis.   Filed Weights   10/27/19 0829  Weight: 273 lb (123.8 kg)    Body mass index is 49.93 kg/m.  Estimated Creatinine Clearance: 135.1 mL/min (by C-G formula based on SCr of 0.47 mg/dL).   Based on [place] patient is candidate for enoxaparin 40mg  every 12 hour dosing due to BMI being >40.  DESCRIPTION: Pharmacy has adjusted enoxaparin dose per [facility] policy.  Patient is now receiving enoxaparin 40mg  every 12 hours.    Ena Dawley, PharmD Clinical Pharmacist  10/29/2019 1:08 AM

## 2019-10-29 NOTE — Anesthesia Postprocedure Evaluation (Signed)
Anesthesia Post Note  Patient: Frances Lamb  Procedure(s) Performed: CESAREAN SECTION (N/A )  Patient location during evaluation: Mother Baby Anesthesia Type: Epidural Level of consciousness: awake and alert and oriented Pain management: satisfactory to patient Vital Signs Assessment: post-procedure vital signs reviewed and stable Respiratory status: respiratory function stable Cardiovascular status: stable Postop Assessment: no headache, no backache, spinal receding, no apparent nausea or vomiting, patient able to bend at knees, adequate PO intake and able to ambulate Anesthetic complications: no     Last Vitals:  Vitals:   10/29/19 0124 10/29/19 0330  BP: (!) 144/84 122/79  Pulse: 92 95  Resp: 20 20  Temp: 37 C 36.8 C  SpO2: 98% 97%    Last Pain:  Vitals:   10/29/19 0722  TempSrc:   PainSc: 4                  Blima Singer

## 2019-10-29 NOTE — Anesthesia Post-op Follow-up Note (Signed)
  Anesthesia Pain Follow-up Note  Patient: Frances Lamb  Day #: 1  Date of Follow-up: 10/29/2019 Time: 7:28 AM  Last Vitals:  Vitals:   10/29/19 0124 10/29/19 0330  BP: (!) 144/84 122/79  Pulse: 92 95  Resp: 20 20  Temp: 37 C 36.8 C  SpO2: 98% 97%    Level of Consciousness: alert  Pain: mild   Side Effects:None  Catheter Site Exam:clean, dry  Anti-Coag Meds (From admission, onward)   Start     Dose/Rate Route Frequency Ordered Stop   10/29/19 2100  enoxaparin (LOVENOX) injection 40 mg     40 mg Subcutaneous Every 12 hours 10/29/19 0101         Plan: D/C from anesthesia care at surgeon's request  Blima Singer

## 2019-10-29 NOTE — Anesthesia Postprocedure Evaluation (Signed)
Anesthesia Post Note  Patient: Frances Lamb  Procedure(s) Performed: CESAREAN SECTION (N/A )  Anesthesia Type: Spinal     Last Vitals:  Vitals:   10/29/19 0124 10/29/19 0330  BP: (!) 144/84 122/79  Pulse: 92 95  Resp: 20 20  Temp: 37 C 36.8 C  SpO2: 98% 97%    Last Pain:  Vitals:   10/29/19 0722  TempSrc:   PainSc: 4                  Blima Singer

## 2019-10-29 NOTE — Clinical Social Work Maternal (Signed)
CLINICAL SOCIAL WORK MATERNAL/CHILD NOTE  Patient Details  Name: Frances Lamb MRN: [id number] Date of Birth: 09/25/1996  Date:  November 15, 2019  Clinical Social Worker Initiating Note:  Durward Fortes, LCSW Date/Time: Initiated:  2019-07-21/0130     Child's Name:  Frances Lamb   Biological Parents:  Mother   Need for Interpreter:  None   Reason for Referral:  Other (Comment)(Edinburgh score 8)   Address:  [address] [place] [address]    Phone number:  [phone] (home)     Additional phone number: none   Household Members/Support Persons (HM/SP):   Household Member/Support Person 1   HM/SP Name Relationship DOB or Age  HM/SP -[place] MOB   23  HM/SP -[place]  FOB     HM/SP -3        HM/SP -4        HM/SP -5        HM/SP -6        HM/SP -7        HM/SP -8          Natural Supports (not living in the home):  Parent, Extended Family, Friends   Professional Supports: Therapist(has had therapist in the past, currently looking for new one at this time.)   Employment: Animator   Type of Work: Production designer, theatre/television/film fro [employer]   Education:  [place] arranged:  n/a  Museum/gallery curator Resources:  Multimedia programmer   Other Resources:  Physicist, medical , WIC(plans to apply for both.)   Cultural/Religious Considerations Which May Impact Care:  none reported.   Strengths:  Pediatrician chosen, Compliance with medical plan , Ability to meet basic needs , Home prepared for child    Psychotropic Medications:    none currently.      Pediatrician:    [facility]  Pediatrician List:   [facility]      Pediatrician Fax Number:    Risk Factors/Current Problems:  None   Cognitive State:  Alert , Able to Concentrate , Insightful    Mood/Affect:  Interested , Happy , Comfortable , Calm , Relaxed    CSW  Assessment: CSW consulted as MOB scored 8 on Edinburgh depression scale. CSW called into MOB's room to speak with her regarding score and answering 1 to question 10. CSW introduced role via phone as CSW reported to [facility] that CSW is working from another location. MOB reported that she was fine and that t was okay for CSW to ask her questions via phone. CSW started conversation by congratulating MOB on the birth of [place]. MOB reported that she was being honest in answering her questions on [place]. MOB reported that has had thoughts of self harm in the past but nothing most recently. MOB reported that that was from 2016.  CSW inquired from [facility] on her mental history. MOB reported that's he has a mental history of depression, anxiety, Bipolar 2 Disorder, and Generalized Social Anxiety. MOB reported that she was on medications in the past for her Bipolar Disorder, however stopped her medications once her pregnancy was confirmed. MOB reported that she is looking to start medications again but not sure what kind she can take as she is looking to speak with [facility] regarding this. MOB encouraged MOB to providers about medications use before taking  medications. MOB reported that she would. MOB reports that she is not SI or HI and denies DV at this time.   MOB reported that she has support from her family including her mom and spouse. MOB reported that she has plans to enroll infant in [facility] Program and sought details  from [facility] [place] how to get this set up for infant. CSW advised MOB to speak with [facility] to establish this as CSW unaware of this program. MOB reported that she has all needed items to care for infant with no other needs at this time.   CSW provided MOB with PPD and SIDS education. MOB was given what signs and symptoms she should look for regarding PPD. MOB reported understanding and expressed no further needs.   CSW Plan/Description:  No Further Intervention Required/No Barriers to Discharge, Sudden Infant Death  Syndrome (SIDS) Education, Perinatal Mood and Anxiety Disorder (PMADs) Education    Robb Matar, LCSWA 29-Apr-2019, 2:16 PM

## 2019-10-29 NOTE — Progress Notes (Signed)
POD #1 LTCS for FTP/CPD Subjective:  Feeling well. OOB ambulating without lightheadedness. Has been voiding without difficulty  Since foley discontinued. Tolerating regular diet and passing flatus.    Objective:  Blood pressure 135/81, pulse 87, temperature 98 F (36.7 C), temperature source Oral, resp. rate 20, height 5\' 2"  (1.575 m), weight 123.8 kg, last menstrual period 02/03/2019, SpO2 98 %, currently breastfeeding. Vital signs: Temp:  [98 F (36.7 C)-99.3 F (37.4 C)] 98 F (36.7 C) (11/30 1303) Pulse Rate:  [87-114] 87 (11/30 1303) Resp:  [13-25] 20 (11/30 1303) BP: (112-154)/(64-99) 135/81 (11/30 1303) SpO2:  [96 %-100 %] 98 % (11/30 1303)   2425 ml urine output in past 24 hours. Has voided 1000 ml since foley out  General: WF in NAD Pulmonary: no increased work of breathing/ CTA Heart: RRR without murmur Abdomen: active BS x 4, non-distended, non-tender, fundus firm at level of umbilicus-1FB/ ML Incision: Honey comb dressing C+D+I; ON Q intact Extremities: +1 edema, no erythema, no tenderness  Results for orders placed or performed during the hospital encounter of 10/27/19 (from the past 72 hour(s))  CBC     Status: Abnormal   Collection Time: 10/27/19  8:59 AM  Result Value Ref Range   WBC 14.4 (H) 4.0 - 10.5 K/uL   RBC 4.08 3.87 - 5.11 MIL/uL   Hemoglobin 10.9 (L) 12.0 - 15.0 g/dL   HCT 78.232.3 (L) 95.636.0 - 21.346.0 %   MCV 79.2 (L) 80.0 - 100.0 fL   MCH 26.7 26.0 - 34.0 pg   MCHC 33.7 30.0 - 36.0 g/dL   RDW 08.614.6 57.811.5 - 46.915.5 %   Platelets 276 150 - 400 K/uL   nRBC 0.0 0.0 - 0.2 %    Comment: Performed at [facility], [address] [place], [place] [address]  Type and screen [facility]     Status: None   Collection Time: 10/27/19  8:59 AM  Result Value Ref Range   ABO/RH(D) O POS    Antibody Screen NEG    Sample Expiration      10/30/2019,2359 Performed at [facility], [address] [place], [place] [id number]   RPR      Status: None   Collection Time: 10/27/19  8:59 AM  Result Value Ref Range   RPR Ser Ql NON REACTIVE NON REACTIVE    Comment: Performed at [facility], [address] [place], [place] [address]  Comprehensive metabolic panel     Status: Abnormal   Collection Time: 10/27/19  8:59 AM  Result Value Ref Range   Sodium 136 135 - 145 mmol/L   Potassium 3.4 (L) 3.5 - 5.1 mmol/L   Chloride 106 98 - 111 mmol/L   CO2 20 (L) 22 - 32 mmol/L   Glucose, Bld 103 (H) 70 - 99 mg/dL   BUN 12 6 - 20 mg/dL   Creatinine, Ser 2.720.47 0.44 - 1.00 mg/dL   Calcium 8.8 (L) 8.9 - 10.3 mg/dL   Total Protein 6.4 (L) 6.5 - 8.1 g/dL   Albumin 2.9 (L) 3.5 - 5.0 g/dL   AST 45 (H) 15 - 41 U/L   ALT 82 (H) 0 - 44 U/L   Alkaline Phosphatase 105 38 - 126 U/L   Total Bilirubin 0.6 0.3 - 1.2 mg/dL   GFR calc non Af Amer >60 >60 mL/min   GFR calc Af Amer >60 >60 mL/min   Anion gap 10 5 - 15    Comment:  Performed at [facility], [address] [place], [place] [address]  Protein / creatinine ratio, urine     Status: Abnormal   Collection Time: 10/27/19  8:59 AM  Result Value Ref Range   Creatinine, Urine 201 mg/dL   Total Protein, Urine 160 mg/dL    Comment: RESULT CONFIRMED BY MANUAL DILUTION SDR NO NORMAL RANGE ESTABLISHED FOR THIS TEST    Protein Creatinine Ratio 0.80 (H) 0.00 - 0.15 mg/mg[Cre]    Comment: Performed at [facility], [address] [place], [place] [address]  Hemoglobin A1c     Status: Abnormal   Collection Time: 10/27/19  8:59 AM  Result Value Ref Range   Hgb A1c MFr Bld 5.8 (H) 4.8 - 5.6 %    Comment: (NOTE) Pre diabetes:          5.7%-6.4% Diabetes:              >6.4% Glycemic control for   <7.0% adults with diabetes    Mean Plasma Glucose 119.76 mg/dL    Comment: Performed at [facility] [address] [place], [place] [address]  Glucose, capillary     Status: None   Collection Time: 10/27/19  9:20 AM  Result Value Ref Range   Glucose-Capillary 96  70 - 99 mg/dL  Glucose, capillary     Status: Abnormal   Collection Time: 10/27/19 10:33 AM  Result Value Ref Range   Glucose-Capillary 110 (H) 70 - 99 mg/dL  Glucose, capillary     Status: Abnormal   Collection Time: 10/27/19 11:08 AM  Result Value Ref Range   Glucose-Capillary 106 (H) 70 - 99 mg/dL  Glucose, capillary     Status: None   Collection Time: 10/27/19 12:18 PM  Result Value Ref Range   Glucose-Capillary 90 70 - 99 mg/dL  Glucose, capillary     Status: None   Collection Time: 10/27/19  2:17 PM  Result Value Ref Range   Glucose-Capillary 74 70 - 99 mg/dL  Glucose, capillary     Status: None   Collection Time: 10/27/19  3:23 PM  Result Value Ref Range   Glucose-Capillary 75 70 - 99 mg/dL  Glucose, capillary     Status: None   Collection Time: 10/27/19  3:59 PM  Result Value Ref Range   Glucose-Capillary 79 70 - 99 mg/dL  Group B strep by PCR     Status: None   Collection Time: 10/27/19  4:37 PM   Specimen: Vaginal/Rectal; Genital  Result Value Ref Range   Group B strep by PCR NEGATIVE NEGATIVE    Comment: (NOTE) Intrapartum testing with Xpert GBS assay should be used as an adjunct to other methods available and not used to replace antepartum testing (at 35-[redacted] weeks gestation). Performed at [facility], [address] [place], [place] [address]   Glucose, capillary     Status: Abnormal   Collection Time: 10/27/19  5:23 PM  Result Value Ref Range   Glucose-Capillary 101 (H) 70 - 99 mg/dL  Glucose, capillary     Status: None   Collection Time: 10/27/19  6:24 PM  Result Value Ref Range   Glucose-Capillary 85 70 - 99 mg/dL  Glucose, capillary     Status: Abnormal   Collection Time: 10/27/19  7:30 PM  Result Value Ref Range   Glucose-Capillary 100 (H) 70 - 99 mg/dL  Glucose, capillary     Status: Abnormal   Collection Time: 10/27/19  8:27 PM  Result  Value Ref Range   Glucose-Capillary 122 (H) 70 - 99 mg/dL  Glucose, capillary     Status: None    Collection Time: 10/27/19  9:29 PM  Result Value Ref Range   Glucose-Capillary 98 70 - 99 mg/dL  Glucose, capillary     Status: None   Collection Time: 10/27/19 10:32 PM  Result Value Ref Range   Glucose-Capillary 79 70 - 99 mg/dL  Glucose, capillary     Status: Abnormal   Collection Time: 10/27/19 11:29 PM  Result Value Ref Range   Glucose-Capillary 114 (H) 70 - 99 mg/dL  Glucose, capillary     Status: None   Collection Time: 10/28/19 12:30 AM  Result Value Ref Range   Glucose-Capillary 85 70 - 99 mg/dL  Glucose, capillary     Status: None   Collection Time: 10/28/19  1:30 AM  Result Value Ref Range   Glucose-Capillary 90 70 - 99 mg/dL  Glucose, capillary     Status: Abnormal   Collection Time: 10/28/19  2:29 AM  Result Value Ref Range   Glucose-Capillary 107 (H) 70 - 99 mg/dL  Glucose, capillary     Status: Abnormal   Collection Time: 10/28/19  3:30 AM  Result Value Ref Range   Glucose-Capillary 101 (H) 70 - 99 mg/dL  Glucose, capillary     Status: Abnormal   Collection Time: 10/28/19  5:27 AM  Result Value Ref Range   Glucose-Capillary 105 (H) 70 - 99 mg/dL  Glucose, capillary     Status: Abnormal   Collection Time: 10/28/19  7:40 AM  Result Value Ref Range   Glucose-Capillary 108 (H) 70 - 99 mg/dL  Glucose, capillary     Status: Abnormal   Collection Time: 10/28/19  9:25 AM  Result Value Ref Range   Glucose-Capillary 108 (H) 70 - 99 mg/dL  Glucose, capillary     Status: Abnormal   Collection Time: 10/28/19 11:47 AM  Result Value Ref Range   Glucose-Capillary 113 (H) 70 - 99 mg/dL  Glucose, capillary     Status: Abnormal   Collection Time: 10/28/19  2:16 PM  Result Value Ref Range   Glucose-Capillary 111 (H) 70 - 99 mg/dL  Glucose, capillary     Status: Abnormal   Collection Time: 10/28/19  5:02 PM  Result Value Ref Range   Glucose-Capillary 111 (H) 70 - 99 mg/dL  Glucose, capillary     Status: Abnormal   Collection Time: 10/28/19  7:12 PM  Result Value Ref  Range   Glucose-Capillary 102 (H) 70 - 99 mg/dL  Glucose, capillary     Status: Abnormal   Collection Time: 10/28/19 10:04 PM  Result Value Ref Range   Glucose-Capillary 113 (H) 70 - 99 mg/dL  CBC     Status: Abnormal   Collection Time: 10/29/19  6:26 AM  Result Value Ref Range   WBC 19.0 (H) 4.0 - 10.5 K/uL   RBC 3.25 (L) 3.87 - 5.11 MIL/uL   Hemoglobin 8.7 (L) 12.0 - 15.0 g/dL   HCT 25.3 (L) 66.4 - 40.3 %   MCV 78.5 (L) 80.0 - 100.0 fL   MCH 26.8 26.0 - 34.0 pg   MCHC 34.1 30.0 - 36.0 g/dL   RDW 47.4 25.9 - 56.3 %   Platelets 240 150 - 400 K/uL   nRBC 0.0 0.0 - 0.2 %    Comment: Performed at [facility], [address] [place], [place] [address]  Creatinine, serum     Status:  None   Collection Time: 10/29/19  6:26 AM  Result Value Ref Range   Creatinine, Ser 0.54 0.44 - 1.00 mg/dL   GFR calc non Af Amer >60 >60 mL/min   GFR calc Af Amer >60 >60 mL/min    Comment: Performed at [facility], [address], [place], [place] [address]     Assessment/ Plan:   23 y.o. [phone] postoperativeday # 1-stable-continue postoperative and postpartum care  Chronic hypertension with superimposed preeclampsia: normal to mild range blood pressures today-continue metoprolol  Acute blood loss anemia - hemodynamically stable and asymptomatic - po ferrous sulfate/ vitamins  --/--/O POS (11/28 8119) /RI/ Varicella NI-offer Varivax on discharge  TDAP status -received TDAP AP  Breast//Contraception-undecided  DIsposition-discharge probably POD  #3  Farrel Conners, CNM

## 2019-10-29 NOTE — Plan of Care (Signed)
Transferred to room 339 Post Partum. Oriented to room, Safety and Security as well as Location manager and POC. V/O.

## 2019-10-29 NOTE — Lactation Note (Addendum)
This note was copied from a baby's chart. Lactation Consultation Note  Patient Name: Frances Lamb [id number] Date: 10/29/2019   Mom attempted to breast feed.  [name] was sleepy and mom gave up quickly.  It could be from circumcision performed at 08:55 this am or it could be because he is used to getting bottlefed first [place] 22 cal and now [place] for low blood sugars.  Mom agreed to pump breasts for stimulation and supplementation.  Set up [employer] DEBP in room with instructions in breast massage, hand expression, collection, storage, cleaning, labeling and handling of expressed milk explaining may not get any volume first couple of days d/t viscosity of colostrum for now until mature milk transitions in.  Mom had C/S and had just got back in bed from going to restroom so was tired and wanted to sleep for now.  FOB had given him a 15 ml bottle of formula and he had spit a small amount.  Letting mom rest for now. Later [name] went to the breast using #20 nipple shield and 8 ml Gerber given along side of nipple shield via curved tip syringe at the breast.  Mom pumped later and expressed 2 to 3 ml.  [name] sleeping while mom pumping, so demonstrated to mom how to give expressed colostrum via TB syringe since such small amount expressed.  Demonstrated how to rub colostrum in flange of pump on nipples.  Mom on Cymbalta, which is L3 category, for bipolar disorder.  Information hand out given from "Medications and Mothers Milk" book and reviewed.  Mom had questions about other medications she might go back on for bipolar disorder.  These medications reviewed as well such as Lamictal.  Parents letting [name] suck on pacifier a lot.  Explained feeding cues and encouraged mom to put him to the breast whenever he demonstrated hunger cues.  Reviewed newborn stomach size, normal course of lactation and routine newborn feeding patterns.  Reviewed supply and demand and need for frequent stimulation to the breast to bring in  mature milk and ensure a plentiful milk supply.  Lactation name and number written on white board and encouraged to call with any questions, concerns or when needed assistance with pumping or nursing.  Maternal Data    Feeding    LATCH Score                   Interventions    Lactation Tools Discussed/Used     Consult Status      [name] 10/29/2019, 2:51 PM

## 2019-10-29 NOTE — Progress Notes (Signed)
WNL

## 2019-10-30 LAB — CBC
HCT: 25 % — ABNORMAL LOW (ref 36.0–46.0)
Hemoglobin: 8.2 g/dL — ABNORMAL LOW (ref 12.0–15.0)
MCH: 26.6 pg (ref 26.0–34.0)
MCHC: 32.8 g/dL (ref 30.0–36.0)
MCV: 81.2 fL (ref 80.0–100.0)
Platelets: 247 10*3/uL (ref 150–400)
RBC: 3.08 MIL/uL — ABNORMAL LOW (ref 3.87–5.11)
RDW: 14.7 % (ref 11.5–15.5)
WBC: 15.6 10*3/uL — ABNORMAL HIGH (ref 4.0–10.5)
nRBC: 0 % (ref 0.0–0.2)

## 2019-10-30 MED ORDER — BUPIVACAINE HCL 0.5 % IJ SOLN
10.0000 mL | Freq: Once | INTRAMUSCULAR | Status: DC
  Filled 2019-10-30: qty 10

## 2019-10-30 MED ORDER — AMLODIPINE BESYLATE 5 MG PO TABS
5.0000 mg | ORAL_TABLET | Freq: Every day | ORAL | Status: DC
  Administered 2019-10-30 – 2019-10-31 (×2): 5 mg via ORAL
  Filled 2019-10-30 (×3): qty 1

## 2019-10-30 MED ORDER — BUPIVACAINE 0.25 % ON-Q PUMP DUAL CATH 400 ML
400.0000 mL | INJECTION | Status: DC
  Filled 2019-10-30: qty 400

## 2019-10-30 NOTE — Lactation Note (Signed)
This note was copied from a baby's chart. Lactation Consultation Note  Patient Name: Frances Lamb Date: 10/30/2019   [facility] checked in with mom with feedings overnight. Mom continues to pump, although not routinely, continued to bottle feed with up to 96mL per feed, and attempted a feeding with use of nipple shield and inserting formula, however reports that it did not go well. Mom desires to continue working on feedings at the breast today, along with continuing to pump.  Mom plans to return to work, 3rd shift, and has goal of primary pumping, with occasional latching on her days off.  LC reviewed normal course of lactation, milk supply and demand, use of nipple shield, newborn feeding patterns, growth spurts/cluster feedings.  Baby ate less than 1 hour ago, and sleeping well. LC will check back in shortly to attempt skin to skin and feeding at the breast.  Maternal Data    Feeding Feeding Type: Bottle Fed - Formula Nipple Type: Slow - flow  LATCH Score                   Interventions    Lactation Tools Discussed/Used     Consult Status      [name] 10/30/2019, 11:16 AM

## 2019-10-30 NOTE — Lactation Note (Signed)
This note was copied from a baby's chart. Lactation Consultation Note  Patient Name: Boy Makenzee Choudhry [id number] Date: 10/30/2019 Reason for consult: Follow-up assessment;Mother's request  [place] and student entered room after being called for assistance. Mom has baby Zaden skin to skin and Ledell Noss was showing feeding cues. LC assisted mom in attaching a size 20 nipple shield to the right breast, and place Zaden in football hold. Zaden latched onto the nipple shield well, and appears to have a strong suck. Zaden fell asleep quickly at the breast, and LC was not able to wake him to continue to feed. The nipple shield did not contain any milk droplets when removed.   Mom questioned if Ledell Noss could still be hungry and LC cautioned that as Ledell Noss had taken in 81ml of formula 1.5 hours ago, and additional supplementation could be too much for his small belly this soon. Mom was encouraged to allow Zaden to settle and rest, and that the next time Ledell Noss is hungry and feed at the breast supplementation could be offered afterwards if needed.    Maternal Data Formula Feeding for Exclusion: No  Feeding Feeding Type: Breast Fed Nipple Type: Slow - flow  LATCH Score Latch: Grasps breast easily, tongue down, lips flanged, rhythmical sucking.  Audible Swallowing: A few with stimulation  Type of Nipple: Everted at rest and after stimulation  Comfort (Breast/Nipple): Soft / non-tender  Hold (Positioning): Assistance needed to correctly position infant at breast and maintain latch.  LATCH Score: 8  Interventions Interventions: Breast feeding basics reviewed;Assisted with latch;Adjust position;Support pillows  Lactation Tools Discussed/Used Tools: Nipple Shields Nipple shield size: 20   Consult Status Consult Status: Follow-up Date: 10/30/19 Follow-up type: In-patient    [name] 10/30/2019, 12:10 PM

## 2019-10-30 NOTE — Progress Notes (Signed)
POD #2 LTCS for FTP Subjective:   Feeling well. OOB without lightheadedness. Nurses reported blood pressure elevations yesterday and this AM after patient was up moving around in room and taking care of baby. Blood pressures normal when relaxing in bed. Has had 3 BMs since surgery. Tolerating a regular diet. Voiding without difficulty. Breast and formula feeding.  Objective:  Blood pressure (!) 150/98, pulse 95, temperature 97.9 F (36.6 C), temperature source Oral, resp. rate 20, height 5\' 2"  (1.575 m), weight 123.8 kg, last menstrual period 02/03/2019, SpO2 100 %, currently breastfeeding. Patient Vitals for the past 24 hrs:  BP Temp Temp src Pulse Resp SpO2  10/30/19 0800 (!) 150/98 - - - - -  10/30/19 0743 (!) 142/108 97.9 F (36.6 C) Oral 95 20 100 %  10/30/19 0358 124/79 98 F (36.7 C) Oral 89 18 100 %  10/29/19 2354 (!) 130/98 98.1 F (36.7 C) Oral (!) 114 18 100 %  10/29/19 1955 (!) 131/92 - - (!) 115 - -  10/29/19 1917 (!) 177/106 98.2 F (36.8 C) Oral (!) 118 18 99 %  10/29/19 1543 136/86 98.9 F (37.2 C) Oral 95 18 98 %  10/29/19 1303 135/81 98 F (36.7 C) Oral 87 20 98 %  10/29/19 1037 - - - - - 97 %   General: WF in NAD Heasrt: RRR with out murmur Pulmonary: no increased work of breathing/ CTAB Lochia: small amount Abdomen: non-distended, non-tender, bowel sounds active Incision: Honeycomb dressing C&D&I, ON Q intact Extremities: +1 edema, no erythema, no tenderness  Results for orders placed or performed during the hospital encounter of 10/27/19 (from the past 72 hour(s))  Glucose, capillary     Status: Abnormal   Collection Time: 10/27/19 10:33 AM  Result Value Ref Range   Glucose-Capillary 110 (H) 70 - 99 mg/dL  Glucose, capillary     Status: Abnormal   Collection Time: 10/27/19 11:08 AM  Result Value Ref Range   Glucose-Capillary 106 (H) 70 - 99 mg/dL  Glucose, capillary     Status: None   Collection Time: 10/27/19 12:18 PM  Result Value Ref Range   Glucose-Capillary 90 70 - 99 mg/dL  Glucose, capillary     Status: None   Collection Time: 10/27/19  2:17 PM  Result Value Ref Range   Glucose-Capillary 74 70 - 99 mg/dL  Glucose, capillary     Status: None   Collection Time: 10/27/19  3:23 PM  Result Value Ref Range   Glucose-Capillary 75 70 - 99 mg/dL  Glucose, capillary     Status: None   Collection Time: 10/27/19  3:59 PM  Result Value Ref Range   Glucose-Capillary 79 70 - 99 mg/dL  Group B strep by PCR     Status: None   Collection Time: 10/27/19  4:37 PM   Specimen: Vaginal/Rectal; Genital  Result Value Ref Range   Group B strep by PCR NEGATIVE NEGATIVE    Comment: (NOTE) Intrapartum testing with Xpert GBS assay should be used as an adjunct to other methods available and not used to replace antepartum testing (at 35-[redacted] weeks gestation). Performed at [facility], [address] [place], [place] [address]   Glucose, capillary     Status: Abnormal   Collection Time: 10/27/19  5:23 PM  Result Value Ref Range   Glucose-Capillary 101 (H) 70 - 99 mg/dL  Glucose, capillary     Status: None   Collection Time: 10/27/19  6:24 PM  Result Value Ref Range  Glucose-Capillary 85 70 - 99 mg/dL  Glucose, capillary     Status: Abnormal   Collection Time: 10/27/19  7:30 PM  Result Value Ref Range   Glucose-Capillary 100 (H) 70 - 99 mg/dL  Glucose, capillary     Status: Abnormal   Collection Time: 10/27/19  8:27 PM  Result Value Ref Range   Glucose-Capillary 122 (H) 70 - 99 mg/dL  Glucose, capillary     Status: None   Collection Time: 10/27/19  9:29 PM  Result Value Ref Range   Glucose-Capillary 98 70 - 99 mg/dL  Glucose, capillary     Status: None   Collection Time: 10/27/19 10:32 PM  Result Value Ref Range   Glucose-Capillary 79 70 - 99 mg/dL  Glucose, capillary     Status: Abnormal   Collection Time: 10/27/19 11:29 PM  Result Value Ref Range   Glucose-Capillary 114 (H) 70 - 99 mg/dL  Glucose, capillary      Status: None   Collection Time: 10/28/19 12:30 AM  Result Value Ref Range   Glucose-Capillary 85 70 - 99 mg/dL  Glucose, capillary     Status: None   Collection Time: 10/28/19  1:30 AM  Result Value Ref Range   Glucose-Capillary 90 70 - 99 mg/dL  Glucose, capillary     Status: Abnormal   Collection Time: 10/28/19  2:29 AM  Result Value Ref Range   Glucose-Capillary 107 (H) 70 - 99 mg/dL  Glucose, capillary     Status: Abnormal   Collection Time: 10/28/19  3:30 AM  Result Value Ref Range   Glucose-Capillary 101 (H) 70 - 99 mg/dL  Glucose, capillary     Status: Abnormal   Collection Time: 10/28/19  5:27 AM  Result Value Ref Range   Glucose-Capillary 105 (H) 70 - 99 mg/dL  Glucose, capillary     Status: Abnormal   Collection Time: 10/28/19  7:40 AM  Result Value Ref Range   Glucose-Capillary 108 (H) 70 - 99 mg/dL  Glucose, capillary     Status: Abnormal   Collection Time: 10/28/19  9:25 AM  Result Value Ref Range   Glucose-Capillary 108 (H) 70 - 99 mg/dL  Glucose, capillary     Status: Abnormal   Collection Time: 10/28/19 11:47 AM  Result Value Ref Range   Glucose-Capillary 113 (H) 70 - 99 mg/dL  Glucose, capillary     Status: Abnormal   Collection Time: 10/28/19  2:16 PM  Result Value Ref Range   Glucose-Capillary 111 (H) 70 - 99 mg/dL  Glucose, capillary     Status: Abnormal   Collection Time: 10/28/19  5:02 PM  Result Value Ref Range   Glucose-Capillary 111 (H) 70 - 99 mg/dL  Glucose, capillary     Status: Abnormal   Collection Time: 10/28/19  7:12 PM  Result Value Ref Range   Glucose-Capillary 102 (H) 70 - 99 mg/dL  Glucose, capillary     Status: Abnormal   Collection Time: 10/28/19 10:04 PM  Result Value Ref Range   Glucose-Capillary 113 (H) 70 - 99 mg/dL  CBC     Status: Abnormal   Collection Time: 10/29/19  6:26 AM  Result Value Ref Range   WBC 19.0 (H) 4.0 - 10.5 K/uL   RBC 3.25 (L) 3.87 - 5.11 MIL/uL   Hemoglobin 8.7 (L) 12.0 - 15.0 g/dL   HCT 25.925.5 (L) 56.336.0  - 46.0 %   MCV 78.5 (L) 80.0 - 100.0 fL   MCH 26.8 26.0 - 34.0 pg  MCHC 34.1 30.0 - 36.0 g/dL   RDW 37.9 02.4 - 09.7 %   Platelets 240 150 - 400 K/uL   nRBC 0.0 0.0 - 0.2 %    Comment: Performed at [facility], [address] [place], [place] [address]  Creatinine, serum     Status: None   Collection Time: 10/29/19  6:26 AM  Result Value Ref Range   Creatinine, Ser 0.54 0.44 - 1.00 mg/dL   GFR calc non Af Amer >60 >60 mL/min   GFR calc Af Amer >60 >60 mL/min    Comment: Performed at [facility], [address] [place], [place] [address]  CBC     Status: Abnormal   Collection Time: 10/30/19  9:02 AM  Result Value Ref Range   WBC 15.6 (H) 4.0 - 10.5 K/uL   RBC 3.08 (L) 3.87 - 5.11 MIL/uL   Hemoglobin 8.2 (L) 12.0 - 15.0 g/dL   HCT 83.4 (L) 19.6 - 22.2 %   MCV 81.2 80.0 - 100.0 fL   MCH 26.6 26.0 - 34.0 pg   MCHC 32.8 30.0 - 36.0 g/dL   RDW 97.9 89.2 - 11.9 %   Platelets 247 150 - 400 K/uL   nRBC 0.0 0.0 - 0.2 %    Comment: Performed at [facility], [address], [place], [place] [address]     Assessment:     23 y.o. [phone] postoperativeday # 2-stable-continue postoperative and postpartum care  Chronic hypertension with superimposed preeclampsia: normal to mild range blood pressures (had one severe range, but repeat was mild range blood pressure) continue metoprolol in PM Will add Norvasc 5 mgm daily in AM. Monitor blood pressures every 4 hours today   Acute blood loss anemia - hemodynamically stable and asymptomatic Hemoglobin 8.2 gm/dl today (down from 8.7 gm/dl yesterday) - po ferrous sulfate/ vitamins   --/--/O POS (11/28 5631) /RI/ Varicella NI-offer Varivax on discharge  TDAP status -received TDAP AP  Breast//Contraception-undecided  DIsposition-discharge probably POD  #3 if blood pressures better controlled.  Farrel Conners, CNM

## 2019-10-30 NOTE — Lactation Note (Signed)
This note was copied from a baby's chart. Lactation Consultation Note  Patient Name: Frances Lamb GGEZM'O Date: 10/30/2019 Reason for consult: Follow-up assessment;Mother's request;Difficult latch;1st time breastfeeding;Early term 52-38.6wks  Mom called out to [facility] for assistance. Baby Frances Lamb was in upright position skin to skin with mom bobbing his head and rooting. With [facility] and student in the room, mom moved baby Frances Lamb independently into football position on left breast. [employer] student assisted with placement of nipple shield. Frances Lamb easily opened his mouth widely and grasped the breast and nipple shield without concerns. Frances Lamb immediately began strong rhythmic sucking, and continued feeding with some light swallows for 10 minutes. LC did provide supplement into nipple shield to encourage Frances Lamb to re-latch, and he began to feed again for 5 additional minutes. A small additional amount of formula supplement was added to nipple shield, and Frances Lamb finished the feed after 3 more minutes; a total feeding time of 18 minutes with Frances Lamb appearing relaxed and sleeping. LC assisted in bringing Frances Lamb to the cradle to swaddle and helped with burping. Frances Lamb burped and was handed to dad for comfort.  Parents given guidance to provide supplement post feed if necessary per previous directions given of 10-70mL. LC encouraged mom to also continue with pumping routine for ongoing breast stimulation. Encouraged to call out with any questions, concerns, for additional breastfeeding support.  Maternal Data Formula Feeding for Exclusion: No Has patient been taught Hand Expression?: Yes Does the patient have breastfeeding experience prior to this delivery?: No  Feeding Feeding Type: Breast Fed  LATCH Score Latch: Grasps breast easily, tongue down, lips flanged, rhythmical sucking.  Audible Swallowing: A few with stimulation  Type of Nipple: Everted at rest and after stimulation  Comfort (Breast/Nipple): Soft /  non-tender  Hold (Positioning): No assistance needed to correctly position infant at breast.  LATCH Score: 9  Interventions Interventions: Breast feeding basics reviewed;Skin to skin;Hand express  Lactation Tools Discussed/Used Tools: Nipple Shields Nipple shield size: 20 Date initiated:: 10/29/19   Consult Status Consult Status: Follow-up Date: 10/30/19 Follow-up type: In-patient    [name] 10/30/2019, 2:40 PM

## 2019-10-30 NOTE — Lactation Note (Signed)
This note was copied from a baby's chart. Lactation Consultation Note  Patient Name: Frances Lamb Date: 10/30/2019 Reason for consult: Follow-up assessment;Mother's request  Mom called out for breastfeeding assistance. Frances Lamb's first feed since last time LC helped. [facility] student assisted with changing of diaper, and calming of baby in effort to achieve a successful latch.  Support pillows moved to right breast/under arm for football position. Mom practiced placing nipple shield in right position, and was able to bring baby independently into good position, tummy turned in, and elicited a wide open mouth. LC and [employer] student praised mom for her independence. Encouraged to continue putting baby to breast on cue, anticipation of 48hr growth spurt/cluster feeding over night tonight, and importance of feeding on cue during this time.  Mom plans to continue breastfeeding, and using formula PRN, based on baby's behavior at the breast and after breastfeeding.  Encouraged mom to call out overnight for breastfeeding assistance if needed.  Maternal Data Formula Feeding for Exclusion: No Has patient been taught Hand Expression?: Yes Does the patient have breastfeeding experience prior to this delivery?: No  Feeding Feeding Type: Breast Fed  LATCH Score Latch: Grasps breast easily, tongue down, lips flanged, rhythmical sucking.  Audible Swallowing: A few with stimulation  Type of Nipple: Everted at rest and after stimulation  Comfort (Breast/Nipple): Soft / non-tender  Hold (Positioning): No assistance needed to correctly position infant at breast.  LATCH Score: 9  Interventions Interventions: Breast feeding basics reviewed;Support pillows;Position options  Lactation Tools Discussed/Used Tools: Nipple Shields Nipple shield size: 20 Date initiated:: 10/29/19   Consult Status Consult Status: Follow-up Date: 10/30/19 Follow-up type: In-patient    Frances Lamb 10/30/2019, 4:50  PM

## 2019-10-31 ENCOUNTER — Other Ambulatory Visit: Payer: Self-pay | Admitting: Certified Nurse Midwife

## 2019-10-31 DIAGNOSIS — Z3490 Encounter for supervision of normal pregnancy, unspecified, unspecified trimester: Secondary | ICD-10-CM | POA: Diagnosis present

## 2019-10-31 MED ORDER — AMLODIPINE BESYLATE 5 MG PO TABS: 5.0000 mg | ORAL_TABLET | Freq: Every day | ORAL | 2 refills | Status: DC

## 2019-10-31 MED ORDER — ENOXAPARIN SODIUM 40 MG/0.4ML ~~LOC~~ SOLN: 40.0000 mg | Freq: Two times a day (BID) | SUBCUTANEOUS | 1 refills | Status: DC

## 2019-10-31 MED ORDER — HYDROXYZINE HCL 50 MG PO TABS: 50.0000 mg | ORAL_TABLET | Freq: Every evening | ORAL | 4 refills | Status: DC | PRN

## 2019-10-31 MED ORDER — FERROUS SULFATE 325 (65 FE) MG PO TABS: 325.0000 mg | ORAL_TABLET | Freq: Two times a day (BID) | ORAL | 3 refills | Status: DC

## 2019-10-31 MED ORDER — ACETAMINOPHEN 500 MG PO TABS: 1000.0000 mg | ORAL_TABLET | Freq: Four times a day (QID) | ORAL | 0 refills | Status: DC | PRN

## 2019-10-31 MED ORDER — DOCUSATE SODIUM 100 MG PO CAPS: 100.0000 mg | ORAL_CAPSULE | Freq: Two times a day (BID) | ORAL | 2 refills | Status: DC | PRN

## 2019-10-31 MED ORDER — ENOXAPARIN SODIUM 60 MG/0.6ML ~~LOC~~ SOLN: 60.0000 mg | SUBCUTANEOUS | 1 refills | Status: DC

## 2019-10-31 MED ORDER — NORETHINDRONE 0.35 MG PO TABS: 1.0000 | ORAL_TABLET | Freq: Every day | ORAL | 11 refills | Status: DC

## 2019-10-31 MED ORDER — OXYCODONE HCL 5 MG PO TABS: 5.0000 mg | ORAL_TABLET | Freq: Four times a day (QID) | ORAL | 0 refills | Status: AC | PRN

## 2019-10-31 MED ORDER — METOPROLOL SUCCINATE ER 25 MG PO TB24: ORAL_TABLET | ORAL | Status: DC

## 2019-10-31 MED ORDER — OXYCODONE HCL 5 MG PO TABS: 5.0000 mg | ORAL_TABLET | Freq: Four times a day (QID) | ORAL | 0 refills | Status: DC | PRN

## 2019-10-31 NOTE — Discharge Instructions (Signed)
Subcutaneous Injection Instructions Using a Prefilled Syringe A subcutaneous injection is a shot of medicine that is given into the layer of fat and tissue between skin and muscle. The injection is given with a single-use syringe that is already filled with medicine (prefilled syringe). Read the medicine guide or package insert that came with the syringe. Follow directions from the guide about how to prepare and give the injection. This is important because the directions may be different for each medicine. Use only the syringe, needle, and medicine that your health care provider prescribes. Use each prefilled syringe and needle only one time. Supplies needed:  Prefilled syringe with needle. Use the needle length and size (gauge) that your health care provider or pharmacist gives to you.  Alcohol wipes.  Bandage.  A container for syringe disposal. This may be a puncture-proof sharps container or a hard-sided plastic container that has a secure lid, such as an empty laundry detergent bottle. How to choose a site for injection Follow instructions from your health care provider about where to give an injection. Do not inject in the same spot each time. There are five main areas that can be used for injecting. These areas include:  Abdomen. Avoid the area that is within 2 inches (5 cm) of your navel (umbilicus).  Front of thigh.  Upper, outer side of thigh.  Upper, outer side of arm.  Upper, outer part of buttock. How to give an injection using a prefilled syringe  1. Wash your hands with soap and water. If soap and water are not available, use hand sanitizer. 2. Use an alcohol wipe to clean the site where you will be injecting the needle. Let the site air-dry. 3. Remove the plastic cover from the needle on the syringe. Do not let the needle touch anything. 4. Hold the syringe with the needle pointing up. Check the syringe for any remaining air bubbles. If there are air bubbles, flick the  syringe with your finger until the air bubbles rise to the top. Then, gently push on the plunger until you can see a drop of medicine appear at the tip of the needle. This will clear any remaining air bubbles from the syringe. 5. Hold the syringe in your writing hand like a pencil. 6. Use your other hand to pinch and hold about an inch (2.5 cm) of skin. Do not directly touch the cleaned part of the skin. 7. Insert the entire needle straight into the fold of skin. The needle should be at a 90-degree angle (perpendicular) to the skin. Push the needle all the way against the skin. The needle may need to be injected at a 45-degree angle in thin adults or children who have a small amount of body fat. 8. After the needle is completely inserted into the skin, release the skin that you are pinching. Continue to hold the syringe with your writing hand. 9. Use your thumb or index finger of your writing hand to push the plunger all the way into the syringe to inject the medicine. 10. Pull the needle straight out of the skin. 11. Press and hold the alcohol wipe over the injection site until bleeding stops. Do not rub the area. 12. Cover the injection site with a bandage, if needed. How to safely throw away the supplies If you are using a syringe that does not have a safety system for shielding the needle after injection:  Do not recap the needle. Place the syringe and needle in the disposal  container. If your syringe has a safety system for shielding the needle after injection:  Firmly push down on the plunger after you complete the injection. The protective sleeve will automatically cover the needle, and you will hear a click. The click means that the needle is safely covered. Follow the disposal regulations for the area where you live. Do not use any syringe or needle more than one time. Contact a health care provider if:  You have difficulty giving the injection.  You think that the injection was not  given correctly.  You have difficulty with any of the supplies.  The medicine causes side effects.  Rashes develop on the skin.  A fever develops.  The condition that is being treated gets worse. Get help right away if: Any of these symptoms develop after the injection is given:  Difficulty breathing.  Chest pain.  A rash over most or all of the body.  Swelling of the lips or tongue.  Difficulty swallowing. Summary  A subcutaneous injection is a shot of medicine that is given into the layer of fat and tissue between skin and muscle.  Read the medicine guide or package insert that came with the syringe. Follow directions from the guide about how to prepare and give the injection.  Follow instructions from your health care provider about where to give an injection.  Contact a health care provider if you cannot give an injection, you think you gave it incorrectly, or you develop any side effects of the medicine.  Get help right away if any of these develop after an injection: difficulty breathing, chest pain, rash on the body, swelling of the lips or tongue, or difficulty swallowing. This information is not intended to replace advice given to you by your health care provider. Make sure you discuss any questions you have with your health care provider. Document Released: 07/29/2011 Document Revised: 03/08/2019 Document Reviewed: 08/16/2018 Elsevier Patient Education  2020 Else[employer]. Cesarean Delivery, Care After This sheet gives you information about how to care for yourself after your procedure. Your health care provider may also give you more specific instructions. If you have problems or questions, contact your health care provider. What can I expect after the procedure? After the procedure, it is common to have:  A small amount of blood or clear fluid coming from the incision.  Some redness, swelling, and pain in your incision area.  Some abdominal pain and  soreness.  Vaginal bleeding (lochia). Even though you did not have a vaginal delivery, you will still have vaginal bleeding and discharge.  Pelvic cramps.  Fatigue. You may have pain, swelling, and discomfort in the tissue between your vagina and your anus (perineum) if:  Your C-section was unplanned, and you were allowed to labor and push.  An incision was made in the area (episiotomy) or the tissue tore during attempted vaginal delivery. Follow these instructions at home: Incision care   Follow instructions from your health care provider about how to take care of your incision. Make sure you: ? Wash your hands with soap and water before you change your bandage (dressing). If soap and water are not available, use hand sanitizer. ? If you have a dressing, change it or remove it as told by your health care provider. ? Leave stitches (sutures), skin staples, skin glue, or adhesive strips in place. These skin closures may need to stay in place for 2 weeks or longer. If adhesive strip edges start to loosen and curl up,  you may trim the loose edges. Do not remove adhesive strips completely unless your health care provider tells you to do that.  Check your incision area every day for signs of infection. Check for: ? More redness, swelling, or pain. ? More fluid or blood. ? Warmth. ? Pus or a bad smell.  Do not take baths, swim, or use a hot tub until your health care provider says it's okay. Ask your health care provider if you can take showers.  When you cough or sneeze, hug a pillow. This helps with pain and decreases the chance of your incision opening up (dehiscing). Do this until your incision heals. Medicines  Take over-the-counter and prescription medicines only as told by your health care provider.  If you were prescribed an antibiotic medicine, take it as told by your health care provider. Do not stop taking the antibiotic even if you start to feel better.  Do not drive or use  heavy machinery while taking prescription pain medicine. Lifestyle  Do not drink alcohol. This is especially important if you are breastfeeding or taking pain medicine.  Do not use any products that contain nicotine or tobacco, such as cigarettes, e-cigarettes, and chewing tobacco. If you need help quitting, ask your health care provider. Eating and drinking  Drink at least 8 eight-ounce glasses of water every day unless told not to by your health care provider. If you breastfeed, you may need to drink even more water.  Eat high-fiber foods every day. These foods may help prevent or relieve constipation. High-fiber foods include: ? Whole grain cereals and breads. ? Brown rice. ? Beans. ? Fresh fruits and vegetables. Activity   If possible, have someone help you care for your baby and help with household activities for at least a few days after you leave the hospital.  Return to your normal activities as told by your health care provider. Ask your health care provider what activities are safe for you.  Rest as much as possible. Try to rest or take a nap while your baby is sleeping.  Do not lift anything that is heavier than 10 lbs (4.5 kg), or the limit that you were told, until your health care provider says that it is safe.  Talk with your health care provider about when you can engage in sexual activity. This may depend on your: ? Risk of infection. ? How fast you heal. ? Comfort and desire to engage in sexual activity. General instructions  Do not use tampons or douches until your health care provider approves.  Wear loose, comfortable clothing and a supportive and well-fitting bra.  Keep your perineum clean and dry. Wipe from front to back when you use the toilet.  If you pass a blood clot, save it and call your health care provider to discuss. Do not flush blood clots down the toilet before you get instructions from your health care provider.  Keep all follow-up visits  for you and your baby as told by your health care provider. This is important. Contact a health care provider if:  You have: ? A fever. ? Bad-smelling vaginal discharge. ? Pus or a bad smell coming from your incision. ? Difficulty or pain when urinating. ? A sudden increase or decrease in the frequency of your bowel movements. ? More redness, swelling, or pain around your incision. ? More fluid or blood coming from your incision. ? A rash. ? Nausea. ? Little or no interest in activities you used to enjoy. ?  Questions about caring for yourself or your baby.  Your incision feels warm to the touch.  Your breasts turn red or become painful or hard.  You feel unusually sad or worried.  You vomit.  You pass a blood clot from your vagina.  You urinate more than usual.  You are dizzy or light-headed. Get help right away if:  You have: ? Pain that does not go away or get better with medicine. ? Chest pain. ? Difficulty breathing. ? Blurred vision or spots in your vision. ? Thoughts about hurting yourself or your baby. ? New pain in your abdomen or in one of your legs. ? A severe headache.  You faint.  You bleed from your vagina so much that you fill more than one sanitary pad in one hour. Bleeding should not be heavier than your heaviest period. Summary  After the procedure, it is common to have pain at your incision site, abdominal cramping, and slight bleeding from your vagina.  Check your incision area every day for signs of infection.  Tell your health care provider about any unusual symptoms.  Keep all follow-up visits for you and your baby as told by your health care provider. This information is not intended to replace advice given to you by your health care provider. Make sure you discuss any questions you have with your health care provider. Document Released: 08/07/2002 Document Revised: 05/24/2018 Document Reviewed: 05/24/2018 Elsevier Patient Education  2020  [employer].

## 2019-10-31 NOTE — Progress Notes (Signed)
Pt discharged with infant. Discharge instructions, prescriptions, and follow up appointments given to and reviewed with patient. Pt verbalized understanding. Escorted out by staff. 

## 2019-10-31 NOTE — Lactation Note (Signed)
This note was copied from a baby's chart. Lactation Consultation Note  Patient Name: Frances Lamb BTDVV'O Date: 10/31/2019 Reason for consult: Follow-up assessment  LC in to visit with mom and [name] before discharge. Mom continued feedings overnight at the breast with nipple shield and formula added to syringe via curved tip syringe. Mom states baby is up to 20-51mL per feeding, and mom continues pumping every 2-3 hours; notes no output with overnight pumpings.  Mom's feeding plan is to continue efforts of feeding at the breast, but planning to introduce bottle with supplement instead of syringe, and continue with pumping post feeds to help encourage onset of adequate milk production. [place] educated mom on importance of frequent breast stimulation/emptying of breast, paced bottle feeding positioning, newborn growth spurts/cluster feedings, wet/stool diapers. Information given for outpatient lactation support after discharge and community breastfeeding support groups. Encouraged mom to call with questions or for assistance.  Maternal Data Formula Feeding for Exclusion: No Has patient been taught Hand Expression?: Yes Does the patient have breastfeeding experience prior to this delivery?: No  Feeding    LATCH Score                   Interventions Interventions: Breast feeding basics reviewed;DEBP  Lactation Tools Discussed/Used     Consult Status Consult Status: Complete Date: 10/31/19 Follow-up type: Call as needed    [name] 10/31/2019, 11:08 AM

## 2019-11-01 ENCOUNTER — Other Ambulatory Visit: Payer: BC Managed Care – PPO

## 2019-11-01 ENCOUNTER — Encounter: Payer: BC Managed Care – PPO | Admitting: Obstetrics and Gynecology

## 2019-11-02 ENCOUNTER — Other Ambulatory Visit: Payer: Self-pay

## 2019-11-02 ENCOUNTER — Encounter: Payer: Self-pay | Admitting: Certified Nurse Midwife

## 2019-11-02 ENCOUNTER — Ambulatory Visit (INDEPENDENT_AMBULATORY_CARE_PROVIDER_SITE_OTHER): Payer: BC Managed Care – PPO | Admitting: Certified Nurse Midwife

## 2019-11-02 DIAGNOSIS — O165 Unspecified maternal hypertension, complicating the puerperium: Secondary | ICD-10-CM

## 2019-11-02 DIAGNOSIS — O119 Pre-existing hypertension with pre-eclampsia, unspecified trimester: Secondary | ICD-10-CM

## 2019-11-02 DIAGNOSIS — Z4889 Encounter for other specified surgical aftercare: Secondary | ICD-10-CM

## 2019-11-03 ENCOUNTER — Other Ambulatory Visit: Payer: Self-pay | Admitting: Obstetrics & Gynecology

## 2019-11-03 MED ORDER — AMLODIPINE BESYLATE 5 MG PO TABS: 10.0000 mg | ORAL_TABLET | Freq: Every day | ORAL | 2 refills | Status: DC

## 2019-11-03 MED ORDER — METOPROLOL SUCCINATE ER 25 MG PO TB24: ORAL_TABLET | ORAL | Status: DC

## 2019-11-03 NOTE — Progress Notes (Signed)
  History of Present Illness:  Frances Lamb is a 23 y.o. who is now 6 days s/p Cesarean section for CPD. Her labor was induced for [facility] with superimposed preeclampsia. She was discharged home on Norvasc 5 mgm and Metaprolol 50 mgm both daily. She is taking both these medications in the evening. She presents for a blood pressure check and incision check. She would also like her ON Q removed. She reports that she and her baby are doing well. She has normal bowel and bladder function. No fever, headaches. Ambulating without difficulty.ON Q has started to leak. Continues also on Lovenox 60 mgm daily Carlos. PMHx: She  has a past medical history of Anxiety, Bipolar 2 disorder ([place]), Depression, Gestational diabetes, Hypertension, Migraine, and Miscarriage. Also,  has a past surgical history that includes Tonsillectomy and adenoidectomy and Cesarean section (N/A, 10/28/2019)., family history includes Diabetes in her maternal grandfather and mother.,  reports that she quit smoking about 6 years ago. Her smoking use included cigarettes. She uses smokeless tobacco. She reports previous alcohol use. She reports that she does not use drugs.  She has a current medication list which includes the following prescription(s): acetaminophen, amlodipine, docusate sodium, duloxetine, enoxaparin, ferrous sulfate, hydroxyzine, metoprolol succinate, ondansetron, oxycodone, prenatal vit-fe fumarate-fa, and norethindrone. Also, has No Known Allergies.  ROS  Physical Exam:  BP (!) 144/100   Ht 5\' 2"  (1.575 m)   Wt 257 lb (116.6 kg)   Breastfeeding Yes   BMI 47.01 kg/m  Body mass index is 47.01 kg/m. Constitutional: Well nourished, well developed female in no acute distress.  Abdomen: diffusely non tender to palpation, non distended. Honey comb dressing removed when ON Q catheters removed. Incision C&D&I Neuro: Grossly intact Psych:  Normal mood and affect.   Extremities: edema in right ankle (hx of multiple injuries to right  ankle), no calf tenderness  Assessment: Mild range blood pressures on current dose of Norvasc and Metaprolol Healing well following surgery  Plan: She will undergo a dosage change in her medical therapy. To increase her Norvasc to 10 mgm daily and decrease her Metaprolol to 25 mgm daily Will take blood pressures at home BID and notify me if BP<100/60 or >160/110 Follow up in 1 week . Dalia Heading, [facility], [facility]

## 2019-11-05 ENCOUNTER — Other Ambulatory Visit: Payer: Self-pay | Admitting: Certified Nurse Midwife

## 2019-11-05 ENCOUNTER — Other Ambulatory Visit: Payer: Self-pay

## 2019-11-07 ENCOUNTER — Encounter: Payer: Self-pay | Admitting: Obstetrics and Gynecology

## 2019-11-07 ENCOUNTER — Ambulatory Visit (INDEPENDENT_AMBULATORY_CARE_PROVIDER_SITE_OTHER): Payer: BC Managed Care – PPO | Admitting: Obstetrics and Gynecology

## 2019-11-07 ENCOUNTER — Other Ambulatory Visit: Payer: Self-pay | Admitting: Obstetrics and Gynecology

## 2019-11-07 ENCOUNTER — Other Ambulatory Visit: Payer: Self-pay

## 2019-11-07 VITALS — BP 114/80 | HR 104 | Temp 97.2°F | Ht 62.0 in | Wt 247.0 lb

## 2019-11-07 DIAGNOSIS — O8600 Infection of obstetric surgical wound, unspecified: Secondary | ICD-10-CM

## 2019-11-07 MED ORDER — SULFAMETHOXAZOLE-TRIMETHOPRIM 800-160 MG PO TABS: 1.0000 | ORAL_TABLET | Freq: Two times a day (BID) | ORAL | 0 refills | Status: DC

## 2019-11-07 MED ORDER — CLINDAMYCIN HCL 300 MG PO CAPS: 300.0000 mg | ORAL_CAPSULE | Freq: Two times a day (BID) | ORAL | 0 refills | Status: AC

## 2019-11-07 NOTE — Progress Notes (Signed)
Patient ID: Frances Lamb, female   DOB: February 17, 1996, 23 y.o.   MRN: [id number]  Reason for Consult: Postpartum Care (inicision has opened in a small spot )   Referred by Glendon Axe, MD  Subjective:     HPI:  Frances Lamb is a 23 y.o. female She was seen today for concerns regarding her incisions. She has a small overlap of tissue which she wanted to make sure was not infected or at risk for dehiscence.   Past Medical History:  Diagnosis Date  . Anxiety   . Bipolar 2 disorder ([place])   . Depression   . Gestational diabetes   . Hypertension   . Migraine   . Miscarriage    Family History  Problem Relation Age of Onset  . Diabetes Mother   . Diabetes Maternal Grandfather    Past Surgical History:  Procedure Laterality Date  . CESAREAN SECTION N/A 10/28/2019   Procedure: CESAREAN SECTION;  Surgeon: Gae Dry, MD;  Location: [facility] ORS;  Service: Obstetrics;  Laterality: N/A;  . TONSILLECTOMY AND ADENOIDECTOMY      Short Social History:  Social History   Tobacco Use  . Smoking status: Former Smoker    Types: Cigarettes    Quit date: 11/29/2012    Years since quitting: 6.9  . Smokeless tobacco: Current User  . Tobacco comment: current vaping weekly  Substance Use Topics  . Alcohol use: Not Currently    No Known Allergies  Current Outpatient Medications  Medication Sig Dispense Refill  . acetaminophen (TYLENOL) 500 MG tablet Take 2 tablets (1,000 mg total) by mouth every 6 (six) hours as needed. 30 tablet 0  . amLODipine (NORVASC) 5 MG tablet Take 2 tablets (10 mg total) by mouth daily. 30 tablet 2  . docusate sodium (COLACE) 100 MG capsule Take 1 capsule (100 mg total) by mouth 2 (two) times daily as needed for mild constipation or moderate constipation. 30 capsule 2  . DULoxetine (CYMBALTA) 60 MG capsule Take 60 mg by mouth daily.    Marland Kitchen enoxaparin (LOVENOX) 60 MG/0.6ML injection Inject 0.6 mLs (60 mg total) into the skin daily. 12.6 mL 1  . ferrous sulfate 325  (65 FE) MG tablet Take 1 tablet (325 mg total) by mouth 2 (two) times daily with a meal. 60 tablet 3  . hydrOXYzine (ATARAX/VISTARIL) 50 MG tablet Take 1 tablet (50 mg total) by mouth at bedtime as needed (sleep). 30 tablet 4  . metoprolol succinate (TOPROL-XL) 25 MG 24 hr tablet TAKE 1 TABLET BY MOUTH DAILY 30 tablet 2  . ondansetron (ZOFRAN ODT) 4 MG disintegrating tablet Take 1 tablet (4 mg total) by mouth every 6 (six) hours as needed for nausea. 20 tablet 0  . Prenatal Vit-Fe Fumarate-FA (PRENATAL COMPLETE PO) Take 1 tablet by mouth daily.    . clindamycin (CLEOCIN) 300 MG capsule Take 1 capsule (300 mg total) by mouth 2 (two) times daily for 5 days. 10 capsule 0  . [START ON 11/25/2019] norethindrone (ERRIN) 0.35 MG tablet Take 1 tablet (0.35 mg total) by mouth daily. (Patient not taking: Reported on 11/07/2019) 1 Package 11   No current facility-administered medications for this visit.    Review of Systems  Constitutional: Negative for chills, fatigue, fever and unexpected weight change.  HENT: Negative for trouble swallowing.  Eyes: Negative for loss of vision.  Respiratory: Negative for cough, shortness of breath and wheezing.  Cardiovascular: Negative for chest pain, leg swelling, palpitations and syncope.  GI: Negative for abdominal pain, blood in stool, diarrhea, nausea and vomiting.  GU: Negative for difficulty urinating, dysuria, frequency and hematuria.  Musculoskeletal: Negative for back pain, leg pain and joint pain.  Skin: Negative for rash.  Neurological: Negative for dizziness, headaches, light-headedness, numbness and seizures.  Psychiatric: Negative for behavioral problem, confusion, depressed mood and sleep disturbance.        Objective:  Objective   Vitals:   11/07/19 1452  BP: 114/80  Pulse: (!) 104  Temp: (!) 97.2 F (36.2 C)  Weight: 247 lb (112 kg)  Height: 5\' 2"  (1.575 m)   Body mass index is 45.18 kg/m.  Physical Exam Vitals and nursing note  reviewed.  Constitutional:      Appearance: She is well-developed.  HENT:     Head: Normocephalic and atraumatic.  Eyes:     Pupils: Pupils are equal, round, and reactive to light.  Cardiovascular:     Rate and Rhythm: Normal rate and regular rhythm.  Pulmonary:     Effort: Pulmonary effort is normal. No respiratory distress.  Abdominal:     General: Abdomen is flat.     Palpations: Abdomen is soft.     Comments: No induration of incision. Intact, no erythema. Slight overlap of tissue close to midline about 1cm in size.   Skin:    General: Skin is warm and dry.  Neurological:     Mental Status: She is alert and oriented to person, place, and time.  Psychiatric:        Behavior: Behavior normal.        Thought Content: Thought content normal.        Judgment: Judgment normal.       Assessment/Plan:    23 yo s/p cesarean section with wound complications.  Appears to mostly just be an overlapping of tissue.  Will start clindamycin prophylactically.  Follow up as planned later this week.   More than 15 minutes were spent face to face with the patient in the room with more than 50% of the time spent providing counseling and discussing the plan of management. We discussed wound cleaning and signs of infection.   22 MD [facility], [facility] 11/07/2019 3:29 PM

## 2019-11-09 ENCOUNTER — Ambulatory Visit: Payer: BC Managed Care – PPO | Admitting: Certified Nurse Midwife

## 2019-11-14 ENCOUNTER — Ambulatory Visit (INDEPENDENT_AMBULATORY_CARE_PROVIDER_SITE_OTHER): Payer: BC Managed Care – PPO | Admitting: Obstetrics & Gynecology

## 2019-11-14 ENCOUNTER — Encounter: Payer: Self-pay | Admitting: Obstetrics & Gynecology

## 2019-11-14 ENCOUNTER — Other Ambulatory Visit: Payer: Self-pay

## 2019-11-14 VITALS — BP 120/70 | Ht 62.0 in | Wt 251.0 lb

## 2019-11-14 DIAGNOSIS — Z98891 History of uterine scar from previous surgery: Secondary | ICD-10-CM | POA: Insufficient documentation

## 2019-11-14 NOTE — Progress Notes (Signed)
  Postoperative Follow-up Patient presents post op from recent Cesarean Section performed for Arrest of Dilation, 2 weeks ago.   Subjective: Patient reports some improvement in her immediate post op symptoms. Eating a regular diet without difficulty. The patient is not having any pain.  Activity: normal activities of daily living. Patient reports additional symptom's since surgery of appropriate lochia, no signs of depression, and no signs of mastitis.  Objective: BP 120/70   Ht 5\' 2"  (1.575 m)   Wt 251 lb (113.9 kg)   BMI 45.91 kg/m  Physical Exam Constitutional:      General: She is not in acute distress.    Appearance: She is well-developed.  Cardiovascular:     Rate and Rhythm: Normal rate.  Pulmonary:     Effort: Pulmonary effort is normal.  Abdominal:     General: There is no distension.     Palpations: Abdomen is soft.     Tenderness: There is no abdominal tenderness.     Comments: Incision Healing Well   Musculoskeletal:        General: Normal range of motion.  Neurological:     Mental Status: She is alert and oriented to person, place, and time.     Cranial Nerves: No cranial nerve deficit.  Skin:    General: Skin is warm and dry.     Assessment: s/p : Cesarean Section for Arrest of Dilation stable  Plan: Patient has done well after her Cesarean Section with no apparent complications.  I have discussed the post-operative course to date, and the expected progress moving forward.  The patient understands what complications to be concerned about.  I will see the patient in routine follow up, or sooner if needed.    Activity plan: No heavy lifting.Marland Kitchen  Pelvic rest. She desires oral progesterone-only contraceptive for postpartum contraception.  Hoyt Koch 11/14/2019, 1:45 PM

## 2019-11-17 ENCOUNTER — Inpatient Hospital Stay: Admit: 2019-11-17 | Payer: Self-pay

## 2019-12-14 ENCOUNTER — Other Ambulatory Visit: Payer: Self-pay

## 2019-12-14 ENCOUNTER — Encounter: Payer: Self-pay | Admitting: Obstetrics & Gynecology

## 2019-12-14 ENCOUNTER — Ambulatory Visit (INDEPENDENT_AMBULATORY_CARE_PROVIDER_SITE_OTHER): Payer: BC Managed Care – PPO | Admitting: Obstetrics & Gynecology

## 2019-12-14 DIAGNOSIS — F319 Bipolar disorder, unspecified: Secondary | ICD-10-CM

## 2019-12-14 DIAGNOSIS — O24419 Gestational diabetes mellitus in pregnancy, unspecified control: Secondary | ICD-10-CM

## 2019-12-14 DIAGNOSIS — Z1389 Encounter for screening for other disorder: Secondary | ICD-10-CM

## 2019-12-14 DIAGNOSIS — Z3041 Encounter for surveillance of contraceptive pills: Secondary | ICD-10-CM

## 2019-12-14 MED ORDER — NORETHIN ACE-ETH ESTRAD-FE 1-20 MG-MCG PO TABS: 1.0000 | ORAL_TABLET | Freq: Every day | ORAL | 3 refills | Status: DC

## 2019-12-14 NOTE — Patient Instructions (Signed)
Psychiatry        Conni Elliot, MD      [facility]      [phone]                [facility] [phone] ext [phone]

## 2019-12-14 NOTE — Progress Notes (Signed)
  OBSTETRICS POSTPARTUM CLINIC PROGRESS NOTE  Subjective:     Frances Lamb is a 24 y.o. G25P1011 female who presents for a postpartum visit. She is 6 weeks postpartum following a Term pregnancy and delivery by C-section failure to progress.  I have fully reviewed the prenatal and intrapartum course. Anesthesia: epidural.  Postpartum course has been complicated by complicated by gestational diabetes and hypertension.  Baby is feeding by Bottle.  Bleeding: patient has not  resumed menses.  Bowel function is normal. Bladder function is normal.  Patient is not sexually active. Contraception method desired is oral progesterone-only contraceptive.  Postpartum depression screening: positive- h/o Bipolar. [place] 10.  The following portions of the patient's history were reviewed and updated as appropriate: allergies, current medications, past family history, past medical history, past social history, past surgical history and problem list.  Review of Systems Pertinent items are noted in HPI.  Objective:    BP 120/70   Ht 5\' 2"  (1.575 m)   Wt 241 lb (109.3 kg)   LMP 12/03/2019   BMI 44.08 kg/m   General:  alert and no distress   Breasts:  inspection negative, no nipple discharge or bleeding, no masses or nodularity palpable  Lungs: clear to auscultation bilaterally  Heart:  regular rate and rhythm, S1, S2 normal, no murmur, click, rub or gallop  Abdomen: soft, non-tender; bowel sounds normal; no masses,  no organomegaly.   Well healed Pfannenstiel incision   Vulva:  normal  Vagina: normal vagina, no discharge, exudate, lesion, or erythema  Cervix:  no cervical motion tenderness and no lesions  Corpus: normal size, contour, position, consistency, mobility, non-tender  Adnexa:  normal adnexa and no mass, fullness, tenderness  Rectal Exam: Not performed.          Assessment:  Post Partum Care visit 1. Postpartum care following cesarean delivery  Plan:  See orders and Patient  Instructions Contraceptive counseling for oral contraceptives (estrogen/progesterone) Resume all normal activities Follow up in: 3 months or as needed.  Referral for psychiatry  01/31/2020, MD, [facility], [facility] 12/14/2019  2:08 PM

## 2019-12-25 ENCOUNTER — Other Ambulatory Visit: Payer: Self-pay

## 2019-12-25 ENCOUNTER — Other Ambulatory Visit (INDEPENDENT_AMBULATORY_CARE_PROVIDER_SITE_OTHER): Payer: BC Managed Care – PPO

## 2019-12-25 DIAGNOSIS — O24419 Gestational diabetes mellitus in pregnancy, unspecified control: Secondary | ICD-10-CM

## 2019-12-26 ENCOUNTER — Other Ambulatory Visit: Payer: Self-pay | Admitting: Obstetrics & Gynecology

## 2019-12-26 DIAGNOSIS — R7309 Other abnormal glucose: Secondary | ICD-10-CM

## 2019-12-26 DIAGNOSIS — O24419 Gestational diabetes mellitus in pregnancy, unspecified control: Secondary | ICD-10-CM

## 2019-12-26 LAB — GLUCOSE TOLERANCE, 2 HOURS
Glucose, 2 hour: 158 mg/dL — ABNORMAL HIGH (ref 65–139)
Glucose, GTT - Fasting: 85 mg/dL (ref 65–99)

## 2020-01-10 ENCOUNTER — Encounter: Payer: Self-pay | Admitting: Psychiatry

## 2020-01-10 ENCOUNTER — Ambulatory Visit (INDEPENDENT_AMBULATORY_CARE_PROVIDER_SITE_OTHER): Payer: BC Managed Care – PPO | Admitting: Psychiatry

## 2020-01-10 ENCOUNTER — Other Ambulatory Visit: Payer: Self-pay

## 2020-01-10 DIAGNOSIS — F3181 Bipolar II disorder: Secondary | ICD-10-CM | POA: Diagnosis not present

## 2020-01-10 DIAGNOSIS — F411 Generalized anxiety disorder: Secondary | ICD-10-CM | POA: Diagnosis not present

## 2020-01-10 DIAGNOSIS — F4010 Social phobia, unspecified: Secondary | ICD-10-CM | POA: Diagnosis not present

## 2020-01-10 DIAGNOSIS — Z3A35 35 weeks gestation of pregnancy: Secondary | ICD-10-CM | POA: Insufficient documentation

## 2020-01-10 MED ORDER — HYDROXYZINE HCL 50 MG PO TABS: 75.0000 mg | ORAL_TABLET | Freq: Every evening | ORAL | 1 refills | Status: DC | PRN

## 2020-01-10 MED ORDER — LAMOTRIGINE 25 MG PO TABS: 25.0000 mg | ORAL_TABLET | ORAL | 0 refills | Status: DC
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
## 2020-01-28 ENCOUNTER — Telehealth: Payer: Self-pay

## 2020-01-28 NOTE — Telephone Encounter (Signed)
Medication management - Telephone call with pt afte she left a late message this evening  she has not been doing good lately, having more anger outbursts and mood "all over the place".  Pt denied any suicidal or homicidal ideations, no plan or intent to want to harm self others.  Patient stated she also wanted to let Dr. Elna Breslow know she went up on Lamictal by 25 mg each week for the past 4 weeks and is now up to 100 mg and has not been able to tell any difference in her mood.  Patient denied any rashes or side effects to Lamictal but stats prior to being pregnant she was taking 250 mg a day.  Patient is scheduled to return 02/05/20 but feels she needs some type of medication adjustment prior to that time.  Agreed to send message to Dr. Elna Breslow with her information shared.

## 2020-01-28 NOTE — Telephone Encounter (Signed)
Returned call to patient. Left voicemail to call back.

## 2020-01-29 ENCOUNTER — Telehealth: Payer: Self-pay

## 2020-01-29 DIAGNOSIS — F3181 Bipolar II disorder: Secondary | ICD-10-CM

## 2020-01-29 DIAGNOSIS — F411 Generalized anxiety disorder: Secondary | ICD-10-CM

## 2020-01-29 DIAGNOSIS — F4010 Social phobia, unspecified: Secondary | ICD-10-CM

## 2020-01-29 MED ORDER — QUETIAPINE FUMARATE ER 150 MG PO TB24: 150.0000 mg | ORAL_TABLET | Freq: Every day | ORAL | 0 refills | Status: DC

## 2020-01-29 MED ORDER — LAMOTRIGINE 150 MG PO TABS: 150.0000 mg | ORAL_TABLET | Freq: Every day | ORAL | 0 refills | Status: DC

## 2020-01-29 NOTE — Addendum Note (Signed)
Addended byJomarie Longs on: 01/29/2020 04:55 PM   Modules accepted: Orders

## 2020-01-29 NOTE — Telephone Encounter (Signed)
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 She's available now to call her. She stated that her medications are not working. She has a scheduled appointment for 01/08/20. Thank you.

## 2020-01-29 NOTE — Telephone Encounter (Signed)
Called patient back to discuss medications. She reports she is still struggling with mood lability.  She reports she is currently on Lamictal 100 mg and still has not noticed any difference.  She used to take Seroquel extended release 150 mg before she got pregnant which was working well for her.  Discussed restarting Seroquel XR 150 mg. Also discussed increasing her Lamictal to 150 mg p.o. daily or in divided dosage.  Patient is agreeable.  Encouraged patient to start psychotherapy sessions-I have sent a message to Nedra Hai to give her a call to schedule her with a therapist here in our office.

## 2020-01-29 NOTE — Telephone Encounter (Signed)
Medication management - left patient a voicemessage Dr. Elna Breslow would try to call her today between 12-1pm and to be expecting the call.

## 2020-01-29 NOTE — Telephone Encounter (Signed)
I can call her around noon, between 12 to 1 pm .

## 2020-01-29 NOTE — Telephone Encounter (Signed)
Attempted to call patient back again as discussed at 12 noon.  Left voicemail for patient to call back or make a sooner appointment.

## 2020-01-29 NOTE — Telephone Encounter (Signed)
Medication management - patient left a message she got off at 5:30am today and would like to speak with Dr. Elna Breslow today.  Requests a time she might could call or if Dr. Elna Breslow can call later today.

## 2020-02-05 ENCOUNTER — Ambulatory Visit (INDEPENDENT_AMBULATORY_CARE_PROVIDER_SITE_OTHER): Payer: BC Managed Care – PPO | Admitting: Psychiatry

## 2020-02-05 ENCOUNTER — Other Ambulatory Visit: Payer: Self-pay

## 2020-02-05 ENCOUNTER — Encounter: Payer: Self-pay | Admitting: Psychiatry

## 2020-02-05 DIAGNOSIS — Z79899 Other long term (current) drug therapy: Secondary | ICD-10-CM | POA: Diagnosis not present

## 2020-02-05 DIAGNOSIS — F4010 Social phobia, unspecified: Secondary | ICD-10-CM

## 2020-02-05 DIAGNOSIS — F3181 Bipolar II disorder: Secondary | ICD-10-CM

## 2020-02-05 DIAGNOSIS — F411 Generalized anxiety disorder: Secondary | ICD-10-CM | POA: Diagnosis not present

## 2020-02-05 MED ORDER — DULOXETINE HCL 60 MG PO CPEP: 60.0000 mg | ORAL_CAPSULE | Freq: Every day | ORAL | 0 refills | Status: DC

## 2020-02-05 NOTE — Progress Notes (Signed)
Provider Location : [facility] Patient Location : Home Virtual Visit via Video Note  I connected with Frances Lamb on 02/05/20 at 10:00 AM EST by a video enabled telemedicine application and verified that I am speaking with the correct person using two identifiers.   I discussed the limitations of evaluation and management by telemedicine and the availability of in person appointments. The patient expressed understanding and agreed to proceed.    I discussed the assessment and treatment plan with the patient. The patient was provided an opportunity to ask questions and all were answered. The patient agreed with the plan and demonstrated an understanding of the instructions.   The patient was advised to call back or seek an in-person evaluation if the symptoms worsen or if the condition fails to improve as anticipated.   [facility] MD OP Progress Note  02/05/2020 11:57 AM Frances Lamb  MRN:  [id number]  Chief Complaint:  Chief Complaint    Follow-up     HPI: Frances Lamb is a 24 year old Caucasian female, employed, lives in [place], married, has a history of bipolar 2 disorder, GAD, social anxiety disorder, hypertension, postpartum status post cesarean section was evaluated by telemedicine today.  Patient today reports she is making progress with her mood symptoms since the recent medication changes.  She is tolerating the Lamictal and the Seroquel well.  She denies side effects.  She reports her depressive symptoms have improved.  She is also less anxious.  She denies any suicidality, homicidality .  She does report paranoia, she believes people are talking about her often however she reports this is chronic and she has been coping with it okay.  It does not overwhelm her too much.  Patient reports she has been unable to find a therapist and is willing to be referred to therapist here in clinic.  She reports she continues to have good support system from her family.  Work continues to be going well.   She continues to work at night.  Patient denies any other concerns today. Visit Diagnosis:    ICD-10-CM   1. Bipolar 2 disorder, major depressive episode (HCC)  F31.81 DULoxetine (CYMBALTA) 60 MG capsule  2. GAD (generalized anxiety disorder)  F41.1 DULoxetine (CYMBALTA) 60 MG capsule  3. Social anxiety disorder  F40.10 DULoxetine (CYMBALTA) 60 MG capsule  4. High risk medication use  Z79.899 Lipid panel    Past Psychiatric History: I have reviewed past psychiatric history from my progress note on 01/10/2020.  Past trials of Lamictal, Cymbalta, Latuda, hydroxyzine, Adderall, Vyvanse, Seroquel  Past Medical History:  Past Medical History:  Diagnosis Date  . ADHD (attention deficit hyperactivity disorder)   . Anxiety   . Bipolar 2 disorder ([place])   . Depression   . Gestational diabetes   . Hypertension   . Migraine   . Miscarriage     Past Surgical History:  Procedure Laterality Date  . CESAREAN SECTION N/A 10/28/2019   Procedure: CESAREAN SECTION;  Surgeon: Gae Dry, MD;  Location: [facility] ORS;  Service: Obstetrics;  Laterality: N/A;  . TONSILLECTOMY AND ADENOIDECTOMY      Family Psychiatric History: I have reviewed family psychiatric history from my progress note on 01/10/2020  Family History:  Family History  Problem Relation Age of Onset  . Diabetes Mother   . Bipolar disorder Mother   . Anxiety disorder Mother   . Depression Mother   . OCD Mother   . Diabetes Maternal Grandfather     Social History: I  have reviewed social history from my progress note on 01/10/2020 Social History   Socioeconomic History  . Marital status: Married    Spouse name: shannon  . Number of children: 1  . Years of education: Not on file  . Highest education level: Associate degree: occupational, Scientist, product/process development, or vocational program  Occupational History  . Not on file  Tobacco Use  . Smoking status: Former Smoker    Types: Cigarettes    Quit date: 11/29/2012    Years since quitting:  7.1  . Smokeless tobacco: Never Used  . Tobacco comment: current vaping weekly  Substance and Sexual Activity  . Alcohol use: Yes    Alcohol/week: 4.0 standard drinks    Types: 2 Glasses of wine, 2 Standard drinks or equivalent per week  . Drug use: Never  . Sexual activity: Not Currently    Birth control/protection: Pill  Other Topics Concern  . Not on file  Social History Narrative  . Not on file   Social Determinants of Health   Financial Resource Strain: Low Risk   . Difficulty of Paying Living Expenses: Not hard at all  Food Insecurity: No Food Insecurity  . Worried About Programme researcher, broadcasting/film/video in the Last Year: Never true  . Ran Out of Food in the Last Year: Never true  Transportation Needs: No Transportation Needs  . Lack of Transportation (Medical): No  . Lack of Transportation (Non-Medical): No  Physical Activity: Inactive  . Days of Exercise per Week: 0 days  . Minutes of Exercise per Session: 0 min  Stress: No Stress Concern Present  . Feeling of Stress : Not at all  Social Connections: Unknown  . Frequency of Communication with Friends and Family: More than three times a week  . Frequency of Social Gatherings with Friends and Family: More than three times a week  . Attends Religious Services: Never  . Active Member of Clubs or Organizations: No  . Attends Banker Meetings: Never  . Marital Status: Not on file    Allergies: No Known Allergies  Metabolic Disorder Labs: Lab Results  Component Value Date   HGBA1C 5.8 (H) 10/27/2019   MPG 119.76 10/27/2019   No results found for: PROLACTIN No results found for: CHOL, TRIG, HDL, CHOLHDL, VLDL, LDLCALC No results found for: TSH  Therapeutic Level Labs: No results found for: LITHIUM No results found for: VALPROATE No components found for:  CBMZ  Current Medications: Current Outpatient Medications  Medication Sig Dispense Refill  . DULoxetine (CYMBALTA) 60 MG capsule Take 1 capsule (60 mg total)  by mouth daily. 90 capsule 0  . hydrOXYzine (ATARAX/VISTARIL) 50 MG tablet Take 1.5-2 tablets (75-100 mg total) by mouth at bedtime as needed (sleep). 60 tablet 1  . lamoTRIgine (LAMICTAL) 150 MG tablet Take 1 tablet (150 mg total) by mouth daily. 90 tablet 0  . metoprolol succinate (TOPROL-XL) 25 MG 24 hr tablet TAKE 1 TABLET BY MOUTH DAILY 30 tablet 2  . norethindrone-ethinyl estradiol (JUNEL FE 1/20) 1-20 MG-MCG tablet Take 1 tablet by mouth daily. 3 Package 3  . QUEtiapine Fumarate (SEROQUEL XR) 150 MG 24 hr tablet Take 1 tablet (150 mg total) by mouth at bedtime. 90 tablet 0   No current facility-administered medications for this visit.     Musculoskeletal: Strength & Muscle Tone: UTA  Gait & Station: normal Patient leans: N/A  Psychiatric Specialty Exam: Review of Systems  Psychiatric/Behavioral: Positive for dysphoric mood. The patient is nervous/anxious.   All  other systems reviewed and are negative.   not currently breastfeeding.There is no height or weight on file to calculate BMI.  General Appearance: Casual  Eye Contact:  Fair  Speech:  Normal Rate  Volume:  Normal  Mood:  Anxious and Depressed improving  Affect:  Congruent  Thought Process:  Goal Directed and Descriptions of Associations: Intact  Orientation:  Full (Time, Place, and Person)  Thought Content: Paranoid Ideation - chronic, coping  Suicidal Thoughts:  No  Homicidal Thoughts:  No  Memory:  Immediate;   Fair Recent;   Fair Remote;   Fair  Judgement:  Fair  Insight:  Fair  Psychomotor Activity:  Normal  Concentration:  Concentration: Fair and Attention Span: Fair  Recall:  [employer] of Knowledge: Fair  Language: Fair  Akathisia:  No  Handed:  Right  AIMS (if indicated): UTA  Assets:  Communication Skills Desire for Improvement Housing Intimacy Social Support Talents/Skills Transportation  ADL's:  Intact  Cognition: WNL  Sleep:  Improving   Screenings: PHQ2-9     Nutrition from  10/10/2019 in [facility] [place]  PHQ-2 Total Score  2  PHQ-9 Total Score  11       Assessment and Plan: Graviela is a 24 year old Caucasian female, married, employed, lives in [place], has a history of bipolar disorder, anxiety disorder, hypertension was evaluated by telemedicine today.  She is biologically predisposed given her family history.  Patient with psychosocial stressors of relationship struggles in the past, being a new mother, job related stressors, history of being adopted.  Patient however is currently making progress on the current medication regimen and has good support system.  She is motivated to start psychotherapy sessions.  Plan as noted below.  Plan Bipolar 2 disorder current episode depressed-improving Lamotrigine 150 mg p.o. daily in divided dosage. Cymbalta 60 mg p.o. daily Hydroxyzine 75 to 100 mg p.o. nightly as needed for sleep We will refer her for CBT-I have sent referral to [facility].  GAD-improving Cymbalta as prescribed Referred for CBT  Social anxiety disorder-unstable  Cymbalta as prescribed Referred for CBT  I have reviewed the following labs in [facility]-dated 01/03/2020-hemoglobin A1c-5.7.  Reviewed EKG in [facility] dated 2019-QTC within normal limits. She may benefit from another EKG since she is currently on Seroquel.  She will benefit from the following labs-lipid panel.  Follow-up in clinic in 4 weeks or sooner if needed.  I have spent atleast 20 minutes non face to face with patient today. More than 50 % of the time was spent for obtaining and to review and separately obtained history , ordering medications and test ,psychoeducation and supportive psychotherapy and care coordination,as well as documenting clinical information in electronic health record,interpreting results of test and communication of results This note was generated in part or whole with voice recognition software. Voice recognition is usually quite accurate but there are  transcription errors that can and very often do occur. I apologize for any typographical errors that were not detected and corrected.       Jomarie Longs, MD 02/05/2020, 11:57 AM

## 2020-02-18 ENCOUNTER — Telehealth: Payer: Self-pay | Admitting: Licensed Clinical Social Worker

## 2020-02-18 ENCOUNTER — Encounter: Payer: BC Managed Care – PPO | Admitting: Licensed Clinical Social Worker

## 2020-02-18 ENCOUNTER — Other Ambulatory Visit: Payer: Self-pay

## 2020-02-18 NOTE — Telephone Encounter (Signed)
Therapist attempted to reach patient regarding scheduled appointment for CCA today after 5 minutes of no showing virtual visit. Phone was answered by [employer] asking about nature of the call and then attempted to connect to patient. Therapist was instructed to leave a message after the beep. Therapist left voicemail with contact information requesting callback. Pt could not be reached.

## 2020-02-20 NOTE — Progress Notes (Signed)
This encounter was created in error - please disregard.

## 2020-02-22 ENCOUNTER — Other Ambulatory Visit: Payer: Self-pay

## 2020-02-22 ENCOUNTER — Ambulatory Visit: Payer: BC Managed Care – PPO | Admitting: Licensed Clinical Social Worker

## 2020-02-22 ENCOUNTER — Telehealth: Payer: Self-pay | Admitting: Licensed Clinical Social Worker

## 2020-02-22 NOTE — Telephone Encounter (Signed)
Therapist attempted to reach patient via phone number in chart twice (at 10am and 10:15am) for CCA after no showing virtual visit. Phone rang with no answer. Automated voice message indicated that patient's voicemail box was full. Pt could not be reached. This is the second appointment scheduled for CCA this week that patient has no showed.

## 2020-03-04 ENCOUNTER — Ambulatory Visit (INDEPENDENT_AMBULATORY_CARE_PROVIDER_SITE_OTHER): Payer: BC Managed Care – PPO | Admitting: Psychiatry

## 2020-03-04 ENCOUNTER — Encounter: Payer: Self-pay | Admitting: Psychiatry

## 2020-03-04 ENCOUNTER — Other Ambulatory Visit: Payer: Self-pay

## 2020-03-04 DIAGNOSIS — F4010 Social phobia, unspecified: Secondary | ICD-10-CM | POA: Diagnosis not present

## 2020-03-04 DIAGNOSIS — G4700 Insomnia, unspecified: Secondary | ICD-10-CM

## 2020-03-04 DIAGNOSIS — F3181 Bipolar II disorder: Secondary | ICD-10-CM

## 2020-03-04 DIAGNOSIS — F5105 Insomnia due to other mental disorder: Secondary | ICD-10-CM | POA: Insufficient documentation

## 2020-03-04 DIAGNOSIS — F411 Generalized anxiety disorder: Secondary | ICD-10-CM | POA: Diagnosis not present

## 2020-03-04 MED ORDER — QUETIAPINE FUMARATE 100 MG PO TABS: 150.0000 mg | ORAL_TABLET | Freq: Every day | ORAL | 1 refills | Status: DC

## 2020-03-04 NOTE — Progress Notes (Signed)
Provider Location : [facility] Patient Location : Home  Virtual Visit via Video Note  I connected with [facility] Nurse on 03/04/20 at 11:00 AM EDT by a video enabled telemedicine application and verified that I am speaking with the correct person using two identifiers.   I discussed the limitations of evaluation and management by telemedicine and the availability of in person appointments. The patient expressed understanding and agreed to proceed.    I discussed the assessment and treatment plan with the patient. The patient was provided an opportunity to ask questions and all were answered. The patient agreed with the plan and demonstrated an understanding of the instructions.   The patient was advised to call back or seek an in-person evaluation if the symptoms worsen or if the condition fails to improve as anticipated.   [facility] MD OP Progress Note  03/04/2020 12:24 PM NELIDA MANDARINO  MRN:  [id number]  Chief Complaint:  Chief Complaint    Follow-up     HPI: Frances Lamb is a 24 year old Caucasian female, employed, lives in [place], married, has a history of bipolar 2 disorder, GAD, social anxiety disorder, hypertension, postpartum status post cesarean section was evaluated by telemedicine today.  Patient today reports she is currently feeling better with regards to her mood.  The Lamictal and Seroquel combination is effective.  She denies side effects.  She reports work is going well.    She however reports sleep issues.  She has difficulty falling asleep.  When she is able to fall asleep she is able to sleep through the night.  She continues to switch back and forth between night and day when she gets to sleep.  She reports when she has to work she works at nights and she sleeps during the day.  And there are days when she is off when she sleeps at night.  The hydroxyzine does help with sleep however it takes 2 hours to kick in once it does ,she is able to sleep well.  Patient denies any suicidality,  homicidality or perceptual disturbances.  Patient denies any other concerns today. Visit Diagnosis:    ICD-10-CM   1. Bipolar 2 disorder, major depressive episode (HCC)  F31.81 QUEtiapine (SEROQUEL) 100 MG tablet  2. GAD (generalized anxiety disorder)  F41.1 QUEtiapine (SEROQUEL) 100 MG tablet  3. Social anxiety disorder  F40.10   4. Insomnia, unspecified type  G47.00     Past Psychiatric History: I have reviewed past psychiatric history from my progress note on 01/10/2020 past trials of Lamictal, Cymbalta, Latuda, hydroxyzine, Adderall, Vyvanse, Seroquel  Past Medical History:  Past Medical History:  Diagnosis Date  . ADHD (attention deficit hyperactivity disorder)   . Anxiety   . Bipolar 2 disorder (HCC)   . Depression   . Gestational diabetes   . Hypertension   . Migraine   . Miscarriage     Past Surgical History:  Procedure Laterality Date  . CESAREAN SECTION N/A 10/28/2019   Procedure: CESAREAN SECTION;  Surgeon: Nadara Mustard, MD;  Location: [facility] ORS;  Service: Obstetrics;  Laterality: N/A;  . TONSILLECTOMY AND ADENOIDECTOMY      Family Psychiatric History: I have reviewed family psychiatric history from my progress note on 01/10/2020  Family History:  Family History  Problem Relation Age of Onset  . Diabetes Mother   . Bipolar disorder Mother   . Anxiety disorder Mother   . Depression Mother   . OCD Mother   . Diabetes Maternal Grandfather  Social History: Reviewed social history from my progress note on 01/10/2020 Social History   Socioeconomic History  . Marital status: Married    Spouse name: shannon  . Number of children: 1  . Years of education: Not on file  . Highest education level: Associate degree: occupational, Hotel manager, or vocational program  Occupational History  . Not on file  Tobacco Use  . Smoking status: Former Smoker    Types: Cigarettes    Quit date: 11/29/2012    Years since quitting: 7.2  . Smokeless tobacco: Never Used  .  Tobacco comment: current vaping weekly  Substance and Sexual Activity  . Alcohol use: Yes    Alcohol/week: 4.0 standard drinks    Types: 2 Glasses of wine, 2 Standard drinks or equivalent per week  . Drug use: Never  . Sexual activity: Not Currently    Birth control/protection: Pill  Other Topics Concern  . Not on file  Social History Narrative  . Not on file   Social Determinants of Health   Financial Resource Strain: Low Risk   . Difficulty of Paying Living Expenses: Not hard at all  Food Insecurity: No Food Insecurity  . Worried About Charity fundraiser in the Last Year: Never true  . Ran Out of Food in the Last Year: Never true  Transportation Needs: No Transportation Needs  . Lack of Transportation (Medical): No  . Lack of Transportation (Non-Medical): No  Physical Activity: Inactive  . Days of Exercise per Week: 0 days  . Minutes of Exercise per Session: 0 min  Stress: No Stress Concern Present  . Feeling of Stress : Not at all  Social Connections: Unknown  . Frequency of Communication with Friends and Family: More than three times a week  . Frequency of Social Gatherings with Friends and Family: More than three times a week  . Attends Religious Services: Never  . Active Member of Clubs or Organizations: No  . Attends Archivist Meetings: Never  . Marital Status: Not on file    Allergies: No Known Allergies  Metabolic Disorder Labs: Lab Results  Component Value Date   HGBA1C 5.8 (H) 10/27/2019   MPG 119.76 10/27/2019   No results found for: PROLACTIN No results found for: CHOL, TRIG, HDL, CHOLHDL, VLDL, LDLCALC No results found for: TSH  Therapeutic Level Labs: No results found for: LITHIUM No results found for: VALPROATE No components found for:  CBMZ  Current Medications: Current Outpatient Medications  Medication Sig Dispense Refill  . DULoxetine (CYMBALTA) 60 MG capsule Take 1 capsule (60 mg total) by mouth daily. 90 capsule 0  .  hydrOXYzine (ATARAX/VISTARIL) 50 MG tablet Take 1.5-2 tablets (75-100 mg total) by mouth at bedtime as needed (sleep). 60 tablet 1  . lamoTRIgine (LAMICTAL) 150 MG tablet Take 1 tablet (150 mg total) by mouth daily. 90 tablet 0  . metoprolol succinate (TOPROL-XL) 25 MG 24 hr tablet TAKE 1 TABLET BY MOUTH DAILY 30 tablet 2  . norethindrone-ethinyl estradiol (JUNEL FE 1/20) 1-20 MG-MCG tablet Take 1 tablet by mouth daily. 3 Package 3  . QUEtiapine (SEROQUEL) 100 MG tablet Take 1.5 tablets (150 mg total) by mouth at bedtime. 21 tablet 1   No current facility-administered medications for this visit.     Musculoskeletal: Strength & Muscle Tone: UTA Gait & Station: normal Patient leans: N/A  Psychiatric Specialty Exam: Review of Systems  Psychiatric/Behavioral: Positive for sleep disturbance.  All other systems reviewed and are negative.   not  currently breastfeeding.There is no height or weight on file to calculate BMI.  General Appearance: Casual  Eye Contact:  Fair  Speech:  Normal Rate  Volume:  Normal  Mood:  Euthymic  Affect:  Congruent  Thought Process:  Goal Directed and Descriptions of Associations: Intact  Orientation:  Full (Time, Place, and Person)  Thought Content: Logical   Suicidal Thoughts:  No  Homicidal Thoughts:  No  Memory:  Immediate;   Fair Recent;   Fair Remote;   Fair  Judgement:  Fair  Insight:  Fair  Psychomotor Activity:  Normal  Concentration:  Concentration: Fair and Attention Span: Fair  Recall:  [employer] of Knowledge: Fair  Language: Fair  Akathisia:  No  Handed:  Right  AIMS (if indicated):UTA  Assets:  Communication Skills Desire for Improvement Housing Social Support  ADL's:  Intact  Cognition: WNL  Sleep:  Poor   Screenings: PHQ2-9     Nutrition from 10/10/2019 in [facility] [place]  PHQ-2 Total Score  2  PHQ-9 Total Score  11       Assessment and Plan: Copelyn is a 24 year old Caucasian female, married, employed,  lives in [place], has a history of bipolar disorder, anxiety disorder, hypertension was evaluated by telemedicine today.  Patient is biologically predisposed given her family history.  Patient with psychosocial stressors of relationship struggles in the past, being a new mother, job related stressors, history of being adopted.  Patient is currently stable with regards to her mood however continues to struggle with sleep.  Plan as noted below.  Plan Bipolar disorder type II, current episode depressed-stable Lamotrigine 150 mg p.o. daily in divided dosage. Seroquel as prescribed Cymbalta 60 mg p.o. daily  GAD-improving Cymbalta as prescribed  Social anxiety disorder-improving Cymbalta as prescribed  Insomnia-unstable Discussed sleep hygiene techniques.  Due to her work schedule she continues to switch back and forth between night and day which could be affecting her sleep.  She reports hydroxyzine helps her to sleep well however she has difficulty falling asleep. Discussed changing her Seroquel to immediate release 150 mg at bedtime.  If this does not work, will change it back to Seroquel extended release 150 mg which she is on right now and add trazodone.  She reports trazodone did not work in the past with sleep.   Patient was referred for CBT with our therapist here in clinic and she was not compliant so the referral was closed-this was discussed with patient.  She currently believes she does not need therapy.  Pending labs-lipid panel-encouraged her to get up with her primary care provider.  Follow-up in clinic 3 weeks or sooner if needed.  I have spent atleast 20 minutes non face to face with patient today. More than 50 % of the time was spent for preparing to see the patient ( e.g., review of test, records ), ordering medications and test ,psychoeducation and supportive psychotherapy and care coordination,as well as documenting clinical information in electronic health record. This  note was generated in part or whole with voice recognition software. Voice recognition is usually quite accurate but there are transcription errors that can and very often do occur. I apologize for any typographical errors that were not detected and corrected.       Jomarie Longs, MD 03/04/2020, 12:24 PM

## 2020-03-20 ENCOUNTER — Ambulatory Visit: Payer: BC Managed Care – PPO | Admitting: Obstetrics & Gynecology

## 2020-03-21 ENCOUNTER — Ambulatory Visit: Payer: BC Managed Care – PPO | Admitting: Obstetrics & Gynecology

## 2020-03-27 ENCOUNTER — Encounter: Payer: Self-pay | Admitting: Psychiatry

## 2020-03-27 ENCOUNTER — Other Ambulatory Visit: Payer: Self-pay

## 2020-03-27 ENCOUNTER — Telehealth (INDEPENDENT_AMBULATORY_CARE_PROVIDER_SITE_OTHER): Payer: BC Managed Care – PPO | Admitting: Psychiatry

## 2020-03-27 DIAGNOSIS — F4010 Social phobia, unspecified: Secondary | ICD-10-CM | POA: Diagnosis not present

## 2020-03-27 DIAGNOSIS — F411 Generalized anxiety disorder: Secondary | ICD-10-CM

## 2020-03-27 DIAGNOSIS — G4700 Insomnia, unspecified: Secondary | ICD-10-CM | POA: Diagnosis not present

## 2020-03-27 DIAGNOSIS — F3176 Bipolar disorder, in full remission, most recent episode depressed: Secondary | ICD-10-CM

## 2020-03-27 MED ORDER — HYDROXYZINE HCL 50 MG PO TABS: 75.0000 mg | ORAL_TABLET | Freq: Every evening | ORAL | 0 refills | Status: DC | PRN

## 2020-03-27 MED ORDER — QUETIAPINE FUMARATE 100 MG PO TABS: 150.0000 mg | ORAL_TABLET | Freq: Every day | ORAL | 0 refills | Status: DC

## 2020-03-27 NOTE — Progress Notes (Signed)
Provider Location : [facility] Patient Location : Home  Virtual Visit via Video Note  I connected with Frances Lamb on 03/27/20 at  1:20 PM EDT by a video enabled telemedicine application and verified that I am speaking with the correct person using two identifiers.   I discussed the limitations of evaluation and management by telemedicine and the availability of in person appointments. The patient expressed understanding and agreed to proceed.   I discussed the assessment and treatment plan with the patient. The patient was provided an opportunity to ask questions and all were answered. The patient agreed with the plan and demonstrated an understanding of the instructions.   The patient was advised to call back or seek an in-person evaluation if the symptoms worsen or if the condition fails to improve as anticipated.   [place] MD OP Progress Note  03/27/2020 6:15 PM Frances Lamb  MRN:  [id number]  Chief Complaint:  Chief Complaint    Follow-up     HPI: Frances Lamb is a 24 year old Caucasian female, employed, lives in [place], married, has a history of bipolar 2 disorder, GAD, social anxiety disorder, hypertension, postpartum status post cesarean section was evaluated by telemedicine today.  Patient today reports she is currently doing well with regards to her mood.  She denies any significant mood lability.  She reports sleep is improved on the Seroquel.  She wants to stay on the Seroquel immediate release.  She denies side effects.  She reports her baby continues to need her attention.  She reports she does have social support system from her family and that has been beneficial.  She reports she recently passed the emergency medical dispatch course.  She reports work continues to be busy.  Patient denies any suicidality, homicidality or perceptual disturbances.  Patient does report possible sexual dysfunction however unclear if this is due to being postpartum, having a baby, work-related  stressors and so on.  Unclear if this is medication induced.  She will follow-up with her OB/GYN and try to make some changes with her lifestyle before making further medication changes.   Visit Diagnosis:    ICD-10-CM   1. Bipolar disorder, in full remission, most recent episode depressed (HCC)  F31.76 QUEtiapine (SEROQUEL) 100 MG tablet   type 2  2. GAD (generalized anxiety disorder)  F41.1 hydrOXYzine (ATARAX/VISTARIL) 50 MG tablet    QUEtiapine (SEROQUEL) 100 MG tablet  3. Social anxiety disorder  F40.10   4. Insomnia, unspecified type  G47.00     Past Psychiatric History: I have reviewed past psychiatric history from my progress note on 01/10/2020.  Past trials of Lamictal, Cymbalta, Latuda, hydroxyzine, Adderall, Vyvanse, Seroquel.  Past Medical History:  Past Medical History:  Diagnosis Date  . ADHD (attention deficit hyperactivity disorder)   . Anxiety   . Bipolar 2 disorder ([place])   . Depression   . Gestational diabetes   . Hypertension   . Migraine   . Miscarriage     Past Surgical History:  Procedure Laterality Date  . CESAREAN SECTION N/A 10/28/2019   Procedure: CESAREAN SECTION;  Surgeon: Gae Dry, MD;  Location: [facility] ORS;  Service: Obstetrics;  Laterality: N/A;  . TONSILLECTOMY AND ADENOIDECTOMY      Family Psychiatric History: I have reviewed family psychiatric history from my progress note on 01/10/2020  Family History:  Family History  Problem Relation Age of Onset  . Diabetes Mother   . Bipolar disorder Mother   . Anxiety disorder Mother   .  Depression Mother   . OCD Mother   . Diabetes Maternal Grandfather     Social History: Reviewed social history from my progress note on 01/10/2020 Social History   Socioeconomic History  . Marital status: Married    Spouse name: shannon  . Number of children: 1  . Years of education: Not on file  . Highest education level: Associate degree: occupational, Scientist, product/process development, or vocational program  Occupational  History  . Not on file  Tobacco Use  . Smoking status: Former Smoker    Types: Cigarettes    Quit date: 11/29/2012    Years since quitting: 7.3  . Smokeless tobacco: Never Used  . Tobacco comment: current vaping weekly  Substance and Sexual Activity  . Alcohol use: Yes    Alcohol/week: 4.0 standard drinks    Types: 2 Glasses of wine, 2 Standard drinks or equivalent per week  . Drug use: Never  . Sexual activity: Not Currently    Birth control/protection: Pill  Other Topics Concern  . Not on file  Social History Narrative  . Not on file   Social Determinants of Health   Financial Resource Strain: Low Risk   . Difficulty of Paying Living Expenses: Not hard at all  Food Insecurity: No Food Insecurity  . Worried About Programme researcher, broadcasting/film/video in the Last Year: Never true  . Ran Out of Food in the Last Year: Never true  Transportation Needs: No Transportation Needs  . Lack of Transportation (Medical): No  . Lack of Transportation (Non-Medical): No  Physical Activity: Inactive  . Days of Exercise per Week: 0 days  . Minutes of Exercise per Session: 0 min  Stress: No Stress Concern Present  . Feeling of Stress : Not at all  Social Connections: Unknown  . Frequency of Communication with Friends and Family: More than three times a week  . Frequency of Social Gatherings with Friends and Family: More than three times a week  . Attends Religious Services: Never  . Active Member of Clubs or Organizations: No  . Attends Banker Meetings: Never  . Marital Status: Not on file    Allergies: No Known Allergies  Metabolic Disorder Labs: Lab Results  Component Value Date   HGBA1C 5.8 (H) 10/27/2019   MPG 119.76 10/27/2019   No results found for: PROLACTIN No results found for: CHOL, TRIG, HDL, CHOLHDL, VLDL, LDLCALC No results found for: TSH  Therapeutic Level Labs: No results found for: LITHIUM No results found for: VALPROATE No components found for:  CBMZ  Current  Medications: Current Outpatient Medications  Medication Sig Dispense Refill  . DULoxetine (CYMBALTA) 60 MG capsule Take 1 capsule (60 mg total) by mouth daily. 90 capsule 0  . hydrOXYzine (ATARAX/VISTARIL) 50 MG tablet Take 1.5-2 tablets (75-100 mg total) by mouth at bedtime as needed (sleep). 180 tablet 0  . lamoTRIgine (LAMICTAL) 150 MG tablet Take 1 tablet (150 mg total) by mouth daily. 90 tablet 0  . metoprolol succinate (TOPROL-XL) 25 MG 24 hr tablet TAKE 1 TABLET BY MOUTH DAILY 30 tablet 2  . norethindrone-ethinyl estradiol (JUNEL FE 1/20) 1-20 MG-MCG tablet Take 1 tablet by mouth daily. 3 Package 3  . QUEtiapine (SEROQUEL) 100 MG tablet Take 1.5 tablets (150 mg total) by mouth at bedtime. 135 tablet 0   No current facility-administered medications for this visit.     Musculoskeletal: Strength & Muscle Tone: UTA Gait & Station: normal Patient leans: N/A  Psychiatric Specialty Exam: Review of Systems  Psychiatric/Behavioral: Negative for agitation, behavioral problems, confusion, decreased concentration, dysphoric mood, hallucinations, self-injury, sleep disturbance and suicidal ideas. The patient is not nervous/anxious and is not hyperactive.   All other systems reviewed and are negative.   not currently breastfeeding.There is no height or weight on file to calculate BMI.  General Appearance: Casual  Eye Contact:  Fair  Speech:  Clear and Coherent  Volume:  Normal  Mood:  Euthymic  Affect:  Congruent  Thought Process:  Goal Directed and Descriptions of Associations: Intact  Orientation:  Full (Time, Place, and Person)  Thought Content: Logical   Suicidal Thoughts:  No  Homicidal Thoughts:  No  Memory:  Immediate;   Fair Recent;   Fair Remote;   Fair  Judgement:  Fair  Insight:  Fair  Psychomotor Activity:  Normal  Concentration:  Concentration: Fair and Attention Span: Fair  Recall:  [employer] of Knowledge: Fair  Language: Fair  Akathisia:  No  Handed:  Right   AIMS (if indicated): UTA  Assets:  Communication Skills Desire for Improvement Housing Social Support  ADL's:  Intact  Cognition: WNL  Sleep:  Fair   Screenings: PHQ2-9     Nutrition from 10/10/2019 in [facility] [place]  PHQ-2 Total Score  2  PHQ-9 Total Score  11       Assessment and Plan: Frances Lamb is a 24 year old Caucasian female, married, employed, lives in [place], has a history of bipolar disorder, anxiety disorder, hypertension was evaluated by telemedicine today.  Patient is biologically predisposed given her family history.  Patient with psychosocial stressors of relationship struggles in the past, being a new mother, job related stressors, history of being adopted and so on.  Patient however is currently stable on current medication regimen except for possible sexual dysfunction as summarized above.  Plan as noted below.  Plan Bipolar disorder type II recent  episode depressed in remission Lamotrigine 150 mg p.o. daily in divided dosage Seroquel as prescribed Cymbalta 60 mg p.o. daily  GAD-stable Cymbalta as prescribed  Social anxiety disorder-improving Cymbalta as prescribed  Insomnia-stable Seroquel 150 mg p.o. nightly.   Patient with sexual dysfunction likely multifactorial, she will try to make lifestyle changes, will follow up with OB/GYN.  If she continues to struggle will make medication changes.  Patient agrees with plan.  Follow-up in clinic in 6 to 8 weeks or sooner if needed.  I have spent atleast 20 minutes non face to face with patient today. More than 50 % of the time was spent for preparing to see the patient ( e.g., review of test, records ), ordering medications and test ,psychoeducation and supportive psychotherapy and care coordination,as well as documenting clinical information in electronic health record. This note was generated in part or whole with voice recognition software. Voice recognition is usually quite accurate but there  are transcription errors that can and very often do occur. I apologize for any typographical errors that were not detected and corrected.      Jomarie Longs, MD 03/27/2020, 6:15 PM

## 2020-04-14 ENCOUNTER — Encounter: Payer: Self-pay | Admitting: Obstetrics & Gynecology

## 2020-04-14 ENCOUNTER — Other Ambulatory Visit: Payer: Self-pay

## 2020-04-14 ENCOUNTER — Ambulatory Visit (INDEPENDENT_AMBULATORY_CARE_PROVIDER_SITE_OTHER): Payer: BC Managed Care – PPO | Admitting: Obstetrics & Gynecology

## 2020-04-14 ENCOUNTER — Other Ambulatory Visit (HOSPITAL_COMMUNITY)
Admission: RE | Admit: 2020-04-14 | Discharge: 2020-04-14 | Disposition: A | Discharge status: Home / Self Care | Payer: Managed Care, Other (non HMO) | Source: Ambulatory Visit | Attending: Obstetrics & Gynecology | Admitting: Obstetrics & Gynecology

## 2020-04-14 VITALS — BP 120/80 | Ht 62.0 in | Wt 253.0 lb

## 2020-04-14 DIAGNOSIS — Z124 Encounter for screening for malignant neoplasm of cervix: Secondary | ICD-10-CM

## 2020-04-14 DIAGNOSIS — Z01419 Encounter for gynecological examination (general) (routine) without abnormal findings: Secondary | ICD-10-CM

## 2020-04-14 DIAGNOSIS — Z3041 Encounter for surveillance of contraceptive pills: Secondary | ICD-10-CM | POA: Diagnosis not present

## 2020-04-14 MED ORDER — NORGESTIMATE-ETH ESTRADIOL 0.25-35 MG-MCG PO TABS: 1.0000 | ORAL_TABLET | Freq: Every day | ORAL | 3 refills | Status: DC

## 2020-04-14 NOTE — Progress Notes (Signed)
HPI:      [name] is a 24 y.o. G2P1011 who LMP was Patient's last menstrual period was 03/23/2020., she presents today for her annual examination. The patient has no complaints today. The patient is sexually active. Her last pap: approximate date 2020 and was normal. The patient does perform self breast exams.  There is notable family history of breast or ovarian cancer in her family.  The patient has regular exercise: yes.  The patient denies current symptoms of depression.    GYN History: Contraception: OCP (estrogen/progesterone)  Reports BTB if misses pill even by a few hours.  PMHx: Past Medical History:  Diagnosis Date  . ADHD (attention deficit hyperactivity disorder)   . Anxiety   . Bipolar 2 disorder (HCC)   . Depression   . Gestational diabetes   . Hypertension   . Migraine   . Miscarriage    Past Surgical History:  Procedure Laterality Date  . CESAREAN SECTION N/A 10/28/2019   Procedure: CESAREAN SECTION;  Surgeon: Nadara Mustard, MD;  Location: [facility] ORS;  Service: Obstetrics;  Laterality: N/A;  . TONSILLECTOMY AND ADENOIDECTOMY     Family History  Problem Relation Age of Onset  . Diabetes Mother   . Bipolar disorder Mother   . Anxiety disorder Mother   . Depression Mother   . OCD Mother   . Diabetes Maternal Grandfather    Social History   Tobacco Use  . Smoking status: Former Smoker    Types: Cigarettes    Quit date: 11/29/2012    Years since quitting: 7.3  . Smokeless tobacco: Never Used  . Tobacco comment: current vaping weekly  Substance Use Topics  . Alcohol use: Yes    Alcohol/week: 4.0 standard drinks    Types: 2 Glasses of wine, 2 Standard drinks or equivalent per week  . Drug use: Never    Current Outpatient Medications:  .  DULoxetine (CYMBALTA) 60 MG capsule, Take 1 capsule (60 mg total) by mouth daily., Disp: 90 capsule, Rfl: 0 .  hydrOXYzine (ATARAX/VISTARIL) 50 MG tablet, Take 1.5-2 tablets (75-100 mg total) by mouth at  bedtime as needed (sleep)., Disp: 180 tablet, Rfl: 0 .  lamoTRIgine (LAMICTAL) 150 MG tablet, Take 1 tablet (150 mg total) by mouth daily., Disp: 90 tablet, Rfl: 0 .  metoprolol succinate (TOPROL-XL) 25 MG 24 hr tablet, TAKE 1 TABLET BY MOUTH DAILY, Disp: 30 tablet, Rfl: 2 .  QUEtiapine (SEROQUEL) 100 MG tablet, Take 1.5 tablets (150 mg total) by mouth at bedtime., Disp: 135 tablet, Rfl: 0 .  norgestimate-ethinyl estradiol (ORTHO-CYCLEN) 0.25-35 MG-MCG tablet, Take 1 tablet by mouth daily., Disp: 3 Package, Rfl: 3 Allergies: Patient has no known allergies.  Review of Systems  Constitutional: Negative for chills, fever and malaise/fatigue.  HENT: Negative for congestion, sinus pain and sore throat.   Eyes: Negative for blurred vision and pain.  Respiratory: Negative for cough and wheezing.   Cardiovascular: Negative for chest pain and leg swelling.  Gastrointestinal: Negative for abdominal pain, constipation, diarrhea, heartburn, nausea and vomiting.  Genitourinary: Negative for dysuria, frequency, hematuria and urgency.  Musculoskeletal: Negative for back pain, joint pain, myalgias and neck pain.  Skin: Negative for itching and rash.  Neurological: Negative for dizziness, tremors and weakness.  Endo/Heme/Allergies: Does not bruise/bleed easily.  Psychiatric/Behavioral: Negative for depression. The patient is not nervous/anxious and does not have insomnia.     Objective: BP 120/80   Ht 5\' 2"  (1.575 m)   Wt 253  lb (114.8 kg)   LMP 03/23/2020   BMI 46.27 kg/m   Filed Weights   04/14/20 1421  Weight: 253 lb (114.8 kg)   Body mass index is 46.27 kg/m. Physical Exam Constitutional:      General: She is not in acute distress.    Appearance: She is well-developed. She is obese.  Genitourinary:     Pelvic exam was performed with patient supine.     Vagina, uterus and rectum normal.     No lesions in the vagina.     No vaginal bleeding.     No cervical motion tenderness, friability,  lesion or polyp.     Uterus is mobile.     Uterus is not enlarged.     No uterine mass detected.    Uterus is midaxial.     No right or left adnexal mass present.     Right adnexa not tender.     Left adnexa not tender.  HENT:     Head: Normocephalic and atraumatic. No laceration.     Right Ear: Hearing normal.     Left Ear: Hearing normal.     Mouth/Throat:     Pharynx: Uvula midline.  Eyes:     Pupils: Pupils are equal, round, and reactive to light.  Neck:     Thyroid: No thyromegaly.  Cardiovascular:     Rate and Rhythm: Normal rate and regular rhythm.     Heart sounds: No murmur. No friction rub. No gallop.   Pulmonary:     Effort: Pulmonary effort is normal. No respiratory distress.     Breath sounds: Normal breath sounds. No wheezing.  Chest:     Breasts:        Right: No mass, skin change or tenderness.        Left: No mass, skin change or tenderness.  Abdominal:     General: Bowel sounds are normal. There is no distension.     Palpations: Abdomen is soft.     Tenderness: There is no abdominal tenderness. There is no rebound.  Musculoskeletal:        General: Normal range of motion.     Cervical back: Normal range of motion and neck supple.  Neurological:     Mental Status: She is alert and oriented to person, place, and time.     Cranial Nerves: No cranial nerve deficit.  Skin:    General: Skin is warm and dry.  Psychiatric:        Judgment: Judgment normal.  Vitals reviewed.     Assessment:  ANNUAL EXAM 1. Women's annual routine gynecological examination   2. Screening for cervical cancer   3. 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 Encounter for surveillance of contraceptive pills      Screening Plan:            1.  Cervical Screening-  Pap smear done today  2. Breast screening- Exam annually and mammogram>40 planned   3. Colonoscopy every 10 years, Hemoccult testing - after age 77  4. Labs managed by PCP  5.  Counseling for contraception: oral contraceptives (estrogen/progesterone)   Encounter for surveillance of contraceptive pills - Change due to BTB if misses pill - norgestimate-ethinyl estradiol (ORTHO-CYCLEN) 0.25-35 MG-MCG tablet; Take 1 tablet by mouth daily.  Dispense: 3 Package; Refill: 3      F/U  Return in about 1 year (around 04/14/2021) for Annual.  Annamarie Major, MD, [facility], [facility] 04/14/2020  2:45 PM

## 2020-04-14 NOTE — Patient Instructions (Signed)
Ethinyl Estradiol; Norgestimate tablets What is this medicine? ETHINYL ESTRADIOL; NORGESTIMATE (ETH in il es tra DYE ole; nor JES ti mate) is an oral contraceptive. The products combine two types of female hormones, an estrogen and a progestin. They are used to prevent ovulation and pregnancy. Some products are also used to treat acne in females. This medicine may be used for other purposes; ask your health care provider or pharmacist if you have questions. COMMON BRAND NAME(S): Estarylla, Mili, MONO-LINYAH, MonoNessa, Norgestimate/Ethinyl Estradiol, Ortho Tri-Cyclen, Ortho Tri-Cyclen Lo, Ortho-Cyclen, Previfem, Sprintec, Tri-Estarylla, TRI-LINYAH, Tri-Lo-Estarylla, Tri-Lo-Marzia, Tri-Lo-Mili, Tri-Lo-Sprintec, Tri-Mili, Tri-Previfem, Tri-Sprintec, Tri-VyLibra, Trinessa, Trinessa Lo, VyLibra What should I tell my health care provider before I take this medicine? They need to know if you have or ever had any of these conditions:  abnormal vaginal bleeding  blood vessel disease or blood clots  breast, cervical, endometrial, ovarian, liver, or uterine cancer  diabetes  gallbladder disease  heart disease or recent heart attack  high blood pressure  high cholesterol  kidney disease  liver disease  migraine headaches  stroke  systemic lupus erythematosus (SLE)  tobacco smoker  an unusual or allergic reaction to estrogens, progestins, other medicines, foods, dyes, or preservatives  pregnant or trying to get pregnant  breast-feeding How should I use this medicine? Take this medicine by mouth. To reduce nausea, this medicine may be taken with food. Follow the directions on the prescription label. Take this medicine at the same time each day and in the order directed on the package. Do not take your medicine more often than directed. Contact your pediatrician regarding the use of this medicine in children. Special care may be needed. This medicine has been used in female children who  have started having menstrual periods. A patient package insert for the product will be given with each prescription and refill. Read this sheet carefully each time. The sheet may change frequently. Overdosage: If you think you have taken too much of this medicine contact a poison control center or emergency room at once. NOTE: This medicine is only for you. Do not share this medicine with others. What if I miss a dose? If you miss a dose, refer to the patient information sheet you received with your medicine for direction. If you miss more than one pill, this medicine may not be as effective and you may need to use another form of birth control. What may interact with this medicine? Do not take this medicine with the following medication:  dasabuvir; ombitasvir; paritaprevir; ritonavir  ombitasvir; paritaprevir; ritonavir This medicine may also interact with the following medications:  acetaminophen  antibiotics or medicines for infections, especially rifampin, rifabutin, rifapentine, and griseofulvin, and possibly penicillins or tetracyclines  aprepitant  ascorbic acid (vitamin C)  atorvastatin  barbiturate medicines, such as phenobarbital  bosentan  carbamazepine  caffeine  clofibrate  cyclosporine  dantrolene  doxercalciferol  felbamate  grapefruit juice  hydrocortisone  medicines for anxiety or sleeping problems, such as diazepam or temazepam  medicines for diabetes, including pioglitazone  mineral oil  modafinil  mycophenolate  nefazodone  oxcarbazepine  phenytoin  prednisolone  ritonavir or other medicines for HIV infection or AIDS  rosuvastatin  selegiline  soy isoflavones supplements  St. John's wort  tamoxifen or raloxifene  theophylline  thyroid hormones  topiramate  warfarin This list may not describe all possible interactions. Give your health care provider a list of all the medicines, herbs, non-prescription drugs, or  dietary supplements you use. Also tell them if   you smoke, drink alcohol, or use illegal drugs. Some items may interact with your medicine. What should I watch for while using this medicine? Visit your doctor or health care professional for regular checks on your progress. You will need a regular breast and pelvic exam and Pap smear while on this medicine. You should also discuss the need for regular mammograms with your health care professional, and follow his or her guidelines for these tests. This medicine can make your body retain fluid, making your fingers, hands, or ankles swell. Your blood pressure can go up. Contact your doctor or health care professional if you feel you are retaining fluid. Use an additional method of contraception during the first cycle that you take these tablets. If you have any reason to think you are pregnant, stop taking this medicine right away and contact your doctor or health care professional. If you are taking this medicine for hormone related problems, it may take several cycles of use to see improvement in your condition. Do not use this product if you smoke and are over 35 years of age. Smoking increases the risk of getting a blood clot or having a stroke while you are taking birth control pills, especially if you are more than 26 years old. If you are a smoker who is 35 years of age or younger, you are strongly advised not to smoke while taking birth control pills. This medicine can make you more sensitive to the sun. Keep out of the sun. If you cannot avoid being in the sun, wear protective clothing and use sunscreen. Do not use sun lamps or tanning beds/booths. If you wear contact lenses and notice visual changes, or if the lenses begin to feel uncomfortable, consult your eye care specialist. In some women, tenderness, swelling, or minor bleeding of the gums may occur. Notify your dentist if this happens. Brushing and flossing your teeth regularly may help limit  this. See your dentist regularly and inform your dentist of the medicines you are taking. If you are going to have elective surgery, you may need to stop taking this medicine before the surgery. Consult your health care professional for advice. This medicine does not protect you against HIV infection (AIDS) or any other sexually transmitted diseases. What side effects may I notice from receiving this medicine? Side effects that you should report to your doctor or health care professional as soon as possible:  breast tissue changes or discharge  changes in vaginal bleeding during your period or between your periods  chest pain  coughing up blood  dizziness or fainting spells  headaches or migraines  leg, arm or groin pain  severe or sudden headaches  stomach pain (severe)  sudden shortness of breath  sudden loss of coordination, especially on one side of the body  speech problems  symptoms of vaginal infection like itching, irritation or unusual discharge  tenderness in the upper abdomen  vomiting  weakness or numbness in the arms or legs, especially on one side of the body  yellowing of the eyes or skin Side effects that usually do not require medical attention (report to your doctor or health care professional if they continue or are bothersome):  breakthrough bleeding and spotting that continues beyond the 3 initial cycles of pills  breast tenderness  mood changes, anxiety, depression, frustration, anger, or emotional outbursts  increased sensitivity to sun or ultraviolet light  nausea  skin rash, acne, or brown spots on the skin  weight gain (slight) This   list may not describe all possible side effects. Call your doctor for medical advice about side effects. You may report side effects to FDA at 1-800-FDA-1088. Where should I keep my medicine? Keep out of the reach of children. Store at room temperature between 15 and 30 degrees C (59 and 86 degrees F).  Throw away any unused medicine after the expiration date. NOTE: This sheet is a summary. It may not cover all possible information. If you have questions about this medicine, talk to your doctor, pharmacist, or health care provider.  2020 Elsevier/Gold Standard (2016-07-26 08:09:09)  

## 2020-04-16 LAB — CYTOLOGY - PAP
Chlamydia: NEGATIVE
Comment: NEGATIVE
Comment: NEGATIVE
Comment: NORMAL
Diagnosis: NEGATIVE
Neisseria Gonorrhea: NEGATIVE
Trichomonas: NEGATIVE

## 2020-05-02 ENCOUNTER — Other Ambulatory Visit: Payer: Self-pay

## 2020-05-02 ENCOUNTER — Emergency Department: Payer: Managed Care, Other (non HMO)

## 2020-05-02 ENCOUNTER — Emergency Department
Admission: EM | Admit: 2020-05-02 | Discharge: 2020-05-02 | Disposition: A | Discharge status: Home / Self Care | Payer: Managed Care, Other (non HMO) | Attending: Emergency Medicine | Admitting: Emergency Medicine

## 2020-05-02 DIAGNOSIS — Y929 Unspecified place or not applicable: Secondary | ICD-10-CM | POA: Insufficient documentation

## 2020-05-02 DIAGNOSIS — Y999 Unspecified external cause status: Secondary | ICD-10-CM | POA: Insufficient documentation

## 2020-05-02 DIAGNOSIS — I10 Essential (primary) hypertension: Secondary | ICD-10-CM | POA: Diagnosis not present

## 2020-05-02 DIAGNOSIS — Z87891 Personal history of nicotine dependence: Secondary | ICD-10-CM | POA: Insufficient documentation

## 2020-05-02 DIAGNOSIS — S8262XA Displaced fracture of lateral malleolus of left fibula, initial encounter for closed fracture: Secondary | ICD-10-CM | POA: Insufficient documentation

## 2020-05-02 DIAGNOSIS — S99912A Unspecified injury of left ankle, initial encounter: Secondary | ICD-10-CM | POA: Diagnosis present

## 2020-05-02 DIAGNOSIS — S82832A Other fracture of upper and lower end of left fibula, initial encounter for closed fracture: Secondary | ICD-10-CM

## 2020-05-02 DIAGNOSIS — Z79899 Other long term (current) drug therapy: Secondary | ICD-10-CM | POA: Insufficient documentation

## 2020-05-02 DIAGNOSIS — S93422A Sprain of deltoid ligament of left ankle, initial encounter: Secondary | ICD-10-CM | POA: Diagnosis not present

## 2020-05-02 DIAGNOSIS — Y9301 Activity, walking, marching and hiking: Secondary | ICD-10-CM | POA: Insufficient documentation

## 2020-05-02 DIAGNOSIS — W108XXA Fall (on) (from) other stairs and steps, initial encounter: Secondary | ICD-10-CM | POA: Insufficient documentation

## 2020-05-02 LAB — CBC
HCT: 39 % (ref 36.0–46.0)
Hemoglobin: 12.8 g/dL (ref 12.0–15.0)
MCH: 25.3 pg — ABNORMAL LOW (ref 26.0–34.0)
MCHC: 32.8 g/dL (ref 30.0–36.0)
MCV: 77.2 fL — ABNORMAL LOW (ref 80.0–100.0)
Platelets: 385 10*3/uL (ref 150–400)
RBC: 5.05 MIL/uL (ref 3.87–5.11)
RDW: 15.1 % (ref 11.5–15.5)
WBC: 11 10*3/uL — ABNORMAL HIGH (ref 4.0–10.5)
nRBC: 0 % (ref 0.0–0.2)

## 2020-05-02 LAB — COMPREHENSIVE METABOLIC PANEL
ALT: 17 U/L (ref 0–44)
AST: 22 U/L (ref 15–41)
Albumin: 4 g/dL (ref 3.5–5.0)
Alkaline Phosphatase: 104 U/L (ref 38–126)
Anion gap: 11 (ref 5–15)
BUN: 13 mg/dL (ref 6–20)
CO2: 24 mmol/L (ref 22–32)
Calcium: 9.3 mg/dL (ref 8.9–10.3)
Chloride: 102 mmol/L (ref 98–111)
Creatinine, Ser: 0.69 mg/dL (ref 0.44–1.00)
GFR calc Af Amer: >60 mL/min (ref >60)
GFR calc non Af Amer: >60 mL/min (ref >60)
Glucose, Bld: 106 mg/dL — ABNORMAL HIGH (ref 70–99)
Potassium: 4.2 mmol/L (ref 3.5–5.1)
Sodium: 137 mmol/L (ref 135–145)
Total Bilirubin: 0.6 mg/dL (ref 0.3–1.2)
Total Protein: 7.8 g/dL (ref 6.5–8.1)

## 2020-05-02 MED ORDER — HYDROCODONE-ACETAMINOPHEN 5-325 MG PO TABS: 1.0000 | ORAL_TABLET | ORAL | 0 refills | Status: DC | PRN

## 2020-05-02 MED ORDER — HYDROCODONE-ACETAMINOPHEN 5-325 MG PO TABS
1.0000 | ORAL_TABLET | Freq: Once | ORAL | Status: AC
  Administered 2020-05-02: 1 via ORAL
  Filled 2020-05-02: qty 1

## 2020-05-02 MED ORDER — NAPROXEN 500 MG PO TABS
500.0000 mg | ORAL_TABLET | Freq: Two times a day (BID) | ORAL | 2 refills | Status: DC
Start: 2020-05-02 — End: 2021-03-15

## 2020-05-02 MED ORDER — HYDROMORPHONE HCL 1 MG/ML IJ SOLN
0.5000 mg | Freq: Once | INTRAMUSCULAR | Status: AC
  Administered 2020-05-02: 0.5 mg via INTRAVENOUS
  Filled 2020-05-02: qty 1

## 2020-05-02 MED ORDER — ONDANSETRON HCL 4 MG/2ML IJ SOLN
4.0000 mg | Freq: Once | INTRAMUSCULAR | Status: AC
  Administered 2020-05-02: 4 mg via INTRAVENOUS
  Filled 2020-05-02: qty 2

## 2020-05-02 NOTE — Discharge Instructions (Addendum)
Please wear the splint and use the crutches as needed.  Please follow-up with orthopedics Dr. Joice Lofts.  Give his office a call to get an appointment.  Let them know you were seen in the emergency room.  Use the pain medications as prescribed.

## 2020-05-02 NOTE — ED Provider Notes (Signed)
[facility] Emergency Department Provider Note   ____________________________________________    I have reviewed the triage vital signs and the nursing notes.   HISTORY  Chief Complaint Ankle Pain     HPI Frances Lamb is a 24 y.o. female with history as noted below who presents with complaints of left ankle pain.  Patient reports she tripped down some stairs inverted her left ankle with deformity.  Sent from urgent care for evaluation.  Denies other injuries.  Not on blood thinners.  Reports a history of frequent ankle sprains  Past Medical History:  Diagnosis Date  . ADHD (attention deficit hyperactivity disorder)   . Anxiety   . Bipolar 2 disorder (HCC)   . Depression   . Gestational diabetes   . Hypertension   . Migraine   . Miscarriage     Patient Active Problem List   Diagnosis Date Noted  . Insomnia 03/04/2020  . High risk medication use 02/05/2020  . Bipolar 2 disorder, major depressive episode (HCC) 01/10/2020  . Social anxiety disorder 01/10/2020  . S/P cesarean section 11/14/2019  . Delivery by cesarean section using transverse incision of lower segment of uterus 10/28/2019  . Bipolar disease during pregnancy in first trimester (HCC) 03/14/2019  . Obesity, Class III, BMI 40-49.9 (morbid obesity) (HCC) 03/14/2019  . GAD (generalized anxiety disorder) 04/18/2018    Past Surgical History:  Procedure Laterality Date  . CESAREAN SECTION N/A 10/28/2019   Procedure: CESAREAN SECTION;  Surgeon: Nadara Mustard, MD;  Location: [facility] ORS;  Service: Obstetrics;  Laterality: N/A;  . TONSILLECTOMY AND ADENOIDECTOMY      Prior to Admission medications   Medication Sig Start Date End Date Taking? Authorizing Provider  DULoxetine (CYMBALTA) 60 MG capsule Take 1 capsule (60 mg total) by mouth daily. 02/05/20   Jomarie Longs, MD  hydrOXYzine (ATARAX/VISTARIL) 50 MG tablet Take 1.5-2 tablets (75-100 mg total) by mouth at bedtime as needed  (sleep). 03/27/20   Jomarie Longs, MD  lamoTRIgine (LAMICTAL) 150 MG tablet Take 1 tablet (150 mg total) by mouth daily. 01/29/20   Jomarie Longs, MD  metoprolol succinate (TOPROL-XL) 25 MG 24 hr tablet TAKE 1 TABLET BY MOUTH DAILY 11/05/19   Farrel Conners, CNM  norgestimate-ethinyl estradiol (ORTHO-CYCLEN) 0.25-35 MG-MCG tablet Take 1 tablet by mouth daily. 04/14/20   Nadara Mustard, MD  QUEtiapine (SEROQUEL) 100 MG tablet Take 1.5 tablets (150 mg total) by mouth at bedtime. 03/27/20   Jomarie Longs, MD     Allergies Patient has no known allergies.  Family History  Problem Relation Age of Onset  . Diabetes Mother   . Bipolar disorder Mother   . Anxiety disorder Mother   . Depression Mother   . OCD Mother   . Diabetes Maternal Grandfather     Social History Social History   Tobacco Use  . Smoking status: Former Smoker    Types: Cigarettes    Quit date: 11/29/2012    Years since quitting: 7.4  . Smokeless tobacco: Never Used  . Tobacco comment: current vaping weekly  Substance Use Topics  . Alcohol use: Yes    Alcohol/week: 4.0 standard drinks    Types: 2 Glasses of wine, 2 Standard drinks or equivalent per week  . Drug use: Never    Review of Systems  Constitutional: No dizziness Eyes: No visual changes.  ENT: No neck pain Cardiovascular: No chest wall pain Respiratory: None shortness of breath. Gastrointestinal: No nausea, no vomiting.  Genitourinary: No groin injury Musculoskeletal: As above Skin: Negative for rash. Neurological: Negative for headaches    ____________________________________________   PHYSICAL EXAM:  VITAL SIGNS: ED Triage Vitals  Enc Vitals Group     BP 05/02/20 1847 (!) 137/94     Pulse Rate 05/02/20 1847 85     Resp 05/02/20 1847 18     Temp 05/02/20 1847 98.6 F (37 C)     Temp Source 05/02/20 1847 Oral     SpO2 05/02/20 1847 97 %     Weight 05/02/20 1848 114.8 kg (253 lb)     Height 05/02/20 1848 1.575 m (5\' 2" )      Head Circumference --      Peak Flow --      Pain Score 05/02/20 1848 5     Pain Loc --      Pain Edu? --      Excl. in [place]? --     Constitutional: Alert and oriented.   Head: Atraumatic. Nose: No swelling or epistaxis Mouth/Throat: Mucous membranes are moist.   Neck:  Painless ROM Cardiovascular: Normal rate, regular rhythm. Good peripheral circulation. Respiratory: Normal respiratory effort.  No retractions.   Musculoskeletal foot appears to be slightly medially dislocated compared to the tibia although difficult to tell, no discoloration, 2+ DP pulse, normal sensation Neurologic:  Normal speech and language. No gross focal neurologic deficits are appreciated.  Skin:  Skin is warm, dry and intact.  Psychiatric: Mood and affect are normal. Speech and behavior are normal.  ____________________________________________   LABS (all labs ordered are listed, but only abnormal results are displayed)  Labs Reviewed  CBC - Abnormal; Notable for the following components:      Result Value   WBC 11.0 (*)    MCV 77.2 (*)    MCH 25.3 (*)    All other components within normal limits  COMPREHENSIVE METABOLIC PANEL - Abnormal; Notable for the following components:   Glucose, Bld 106 (*)    All other components within normal limits   ____________________________________________  EKG  None ____________________________________________  RADIOLOGY  Ankle x-ray reviewed by me ____________________________________________   PROCEDURES  Procedure(s) performed: No  Procedures   Critical Care performed: No ____________________________________________   INITIAL IMPRESSION / ASSESSMENT AND PLAN / ED COURSE  Pertinent labs & imaging results that were available during my care of the patient were reviewed by me and considered in my medical decision making (see chart for details).  Patient presents with fall injury, ankle deformity, differential includes fracture, dislocation,  sprain.  Pending x-ray.  Will give 0.5 IV Dilaudid  Discussed x-ray results with Dr. Roland Rack of orthopedics who recommends splinting in dorsiflexion after possible reduction  Superior and lateral pressure placed on medial midfoot with improved alignment, patient tolerated well.  I personally splinted the patient with posterior splint  Will obtain CT foot at Dr. Nicholaus Bloom recommendation, outpatient follow-up with him  Have asked Dr. Cinda Quest to follow-up on CT scan anticipate discharge    ____________________________________________   FINAL CLINICAL IMPRESSION(S) / ED DIAGNOSES  Final diagnoses:  Closed fracture of distal end of left fibula, unspecified fracture morphology, initial encounter  Sprain of deltoid ligament of left ankle, initial encounter        Note:  This document was prepared using Dragon voice recognition software and may include unintentional dictation errors.   [name], MD 05/02/20 2021

## 2020-05-02 NOTE — ED Triage Notes (Signed)
Pt states that she fell down the stairs around 1700, pt went to urgent care who sent her to the ER. Pt's left foot is inverted and she is unable to straighten, states is a little tingly.pedal pulse palpated

## 2020-05-02 NOTE — ED Notes (Signed)
Pt trx to x-ray 

## 2020-05-02 NOTE — ED Notes (Signed)
Pt trx to CT.  

## 2020-05-03 ENCOUNTER — Other Ambulatory Visit: Payer: Self-pay | Admitting: Psychiatry

## 2020-05-03 DIAGNOSIS — F4010 Social phobia, unspecified: Secondary | ICD-10-CM

## 2020-05-03 DIAGNOSIS — F411 Generalized anxiety disorder: Secondary | ICD-10-CM

## 2020-05-03 DIAGNOSIS — F3181 Bipolar II disorder: Secondary | ICD-10-CM

## 2020-05-05 ENCOUNTER — Telehealth: Payer: Self-pay

## 2020-05-05 NOTE — Telephone Encounter (Signed)
pt called left a message that she is struggling with her depression and feels she needs something else to help her.

## 2020-05-05 NOTE — Telephone Encounter (Signed)
Returned call to patient.  Left voicemail that we could go up on her Seroquel or her Lamictal.  Advised patient to leave a message on the nurses line as what she wants [place] to do.  Also left a message to go to the nearest emergency department if she is in a crisis.

## 2020-05-07 ENCOUNTER — Telehealth: Payer: Self-pay

## 2020-05-07 DIAGNOSIS — F4010 Social phobia, unspecified: Secondary | ICD-10-CM

## 2020-05-07 DIAGNOSIS — F3181 Bipolar II disorder: Secondary | ICD-10-CM

## 2020-05-07 DIAGNOSIS — F411 Generalized anxiety disorder: Secondary | ICD-10-CM

## 2020-05-07 MED ORDER — DULOXETINE HCL 60 MG PO CPEP: 60.0000 mg | ORAL_CAPSULE | Freq: Every day | ORAL | 0 refills | Status: DC

## 2020-05-07 NOTE — Telephone Encounter (Signed)
I have sent Cymbalta to the pharmacy.  She has enough of the Lamictal and Seroquel for now.  Have also left a message on her voicemail since she had called a few days ago requesting medication changes.  This is second attempt to reach patient.

## 2020-05-07 NOTE — Telephone Encounter (Signed)
pt left a message that she needs refills on her medications.

## 2020-05-12 ENCOUNTER — Telehealth: Payer: Self-pay

## 2020-05-12 NOTE — Telephone Encounter (Signed)
Pt has called and left a message that she needed refills on her medication pt was called back and left a message that she had refills sent into her pharmacy and that she needed to contact pharmacy an ask if they have rx on hold for her.

## 2020-05-27 ENCOUNTER — Other Ambulatory Visit: Payer: Self-pay

## 2020-05-27 ENCOUNTER — Encounter: Payer: Self-pay | Admitting: Psychiatry

## 2020-05-27 ENCOUNTER — Telehealth (INDEPENDENT_AMBULATORY_CARE_PROVIDER_SITE_OTHER): Payer: BC Managed Care – PPO | Admitting: Psychiatry

## 2020-05-27 DIAGNOSIS — F411 Generalized anxiety disorder: Secondary | ICD-10-CM

## 2020-05-27 DIAGNOSIS — F4010 Social phobia, unspecified: Secondary | ICD-10-CM

## 2020-05-27 DIAGNOSIS — F3181 Bipolar II disorder: Secondary | ICD-10-CM

## 2020-05-27 DIAGNOSIS — G4700 Insomnia, unspecified: Secondary | ICD-10-CM

## 2020-05-27 MED ORDER — LAMOTRIGINE ER 250 MG PO TB24: 250.0000 mg | ORAL_TABLET | Freq: Every day | ORAL | 1 refills | Status: DC

## 2020-05-27 MED ORDER — LAMOTRIGINE ER 200 MG PO TB24: 200.0000 mg | ORAL_TABLET | Freq: Every day | ORAL | 0 refills | Status: DC

## 2020-05-27 NOTE — Progress Notes (Signed)
Provider Location : [facility] Patient Location : [place]  Virtual Visit via Video Note  I connected with Frances NurseErin E Lamb on 05/27/20 at  2:40 PM EDT by a video enabled telemedicine application and verified that I am speaking with the correct person using two identifiers.   I discussed the limitations of evaluation and management by telemedicine and the availability of in person appointments. The patient expressed understanding and agreed to proceed.    I discussed the assessment and treatment plan with the patient. The patient was provided an opportunity to ask questions and all were answered. The patient agreed with the plan and demonstrated an understanding of the instructions.   The patient was advised to call back or seek an in-person evaluation if the symptoms worsen or if the condition fails to improve as anticipated.  BH MD OP Progress Note  05/27/2020 5:24 PM Frances Nurserin E Ingalls  MRN:  [id number]  Chief Complaint:  Chief Complaint    Follow-up     HPI: Frances Lamb is a 24 year old Caucasian female, employed, lives in [place], has a history of bipolar 2 disorder, GAD, social anxiety disorder, hypertension, postpartum status post cesarean section was evaluated by telemedicine today.  Patient today reports she is currently struggling with mood symptoms.  She feels as though even simple things are making her to be on edge or irritable.  This usually happens at home.  She reports her son seems to be very clingy recently.  She reports she hence is not able to get anything done.  She does have support system however there are days when she feels irritable.  She reports she used to be on Lamictal 250 mg extended release previously which may have helped.  She would like to go back on the 250 mg if possible.  Patient denies any suicidality, homicidality or perceptual disturbances.  She reports work is going well.  She reports she is able to sleep okay.  She continues to take Seroquel which helps  with mood and sleep.  Patient denies any side effects to medications.  Patient reports she is not ready to start psychotherapy sessions yet however will let writer know.  Patient is currently recovering from left ankle fracture.  She reports she is no longer in pain.    Visit Diagnosis:    ICD-10-CM   1. Bipolar 2 disorder, major depressive episode (HCC)  F31.81 LamoTRIgine 200 MG TB24 24 hour tablet    LamoTRIgine 250 MG TB24 24 hour tablet  2. GAD (generalized anxiety disorder)  F41.1 LamoTRIgine 250 MG TB24 24 hour tablet  3. Social anxiety disorder  F40.10   4. Insomnia, unspecified type  G47.00     Past Psychiatric History: I have reviewed past psychiatric history from my progress note on 01/10/2020.  Past trials of Lamictal, Cymbalta, Latuda, hydroxyzine, Adderall, Vyvanse, Seroquel.  Past Medical History:  Past Medical History:  Diagnosis Date  . ADHD (attention deficit hyperactivity disorder)   . Anxiety   . Bipolar 2 disorder (HCC)   . Depression   . Gestational diabetes   . Hypertension   . Migraine   . Miscarriage     Past Surgical History:  Procedure Laterality Date  . CESAREAN SECTION N/A 10/28/2019   Procedure: CESAREAN SECTION;  Surgeon: Nadara MustardHarris, Robert P, MD;  Location: [facility] ORS;  Service: Obstetrics;  Laterality: N/A;  . TONSILLECTOMY AND ADENOIDECTOMY      Family Psychiatric History: I have reviewed family psychiatric history from my progress note on 01/10/2020  Family History:  Family History  Problem Relation Age of Onset  . Diabetes Mother   . Bipolar disorder Mother   . Anxiety disorder Mother   . Depression Mother   . OCD Mother   . Diabetes Maternal Grandfather     Social History: Reviewed social history from my progress note on 01/10/2020 Social History   Socioeconomic History  . Marital status: Married    Spouse name: shannon  . Number of children: 1  . Years of education: Not on file  . Highest education level: Associate degree:  occupational, Scientist, product/process development, or vocational program  Occupational History  . Not on file  Tobacco Use  . Smoking status: Former Smoker    Types: Cigarettes    Quit date: 11/29/2012    Years since quitting: 7.4  . Smokeless tobacco: Never Used  . Tobacco comment: current vaping weekly  Vaping Use  . Vaping Use: Every day  Substance and Sexual Activity  . Alcohol use: Yes    Alcohol/week: 4.0 standard drinks    Types: 2 Glasses of wine, 2 Standard drinks or equivalent per week  . Drug use: Never  . Sexual activity: Not Currently    Birth control/protection: Pill  Other Topics Concern  . Not on file  Social History Narrative  . Not on file   Social Determinants of Health   Financial Resource Strain: Low Risk   . Difficulty of Paying Living Expenses: Not hard at all  Food Insecurity: No Food Insecurity  . Worried About Programme researcher, broadcasting/film/video in the Last Year: Never true  . Ran Out of Food in the Last Year: Never true  Transportation Needs: No Transportation Needs  . Lack of Transportation (Medical): No  . Lack of Transportation (Non-Medical): No  Physical Activity: Inactive  . Days of Exercise per Week: 0 days  . Minutes of Exercise per Session: 0 min  Stress: No Stress Concern Present  . Feeling of Stress : Not at all  Social Connections: Unknown  . Frequency of Communication with Friends and Family: More than three times a week  . Frequency of Social Gatherings with Friends and Family: More than three times a week  . Attends Religious Services: Never  . Active Member of Clubs or Organizations: No  . Attends Banker Meetings: Never  . Marital Status: Not on file    Allergies: No Known Allergies  Metabolic Disorder Labs: Lab Results  Component Value Date   HGBA1C 5.8 (H) 10/27/2019   MPG 119.76 10/27/2019   No results found for: PROLACTIN No results found for: CHOL, TRIG, HDL, CHOLHDL, VLDL, LDLCALC No results found for: TSH  Therapeutic Level Labs: No  results found for: LITHIUM No results found for: VALPROATE No components found for:  CBMZ  Current Medications: Current Outpatient Medications  Medication Sig Dispense Refill  . norethindrone-ethinyl estradiol (AUROVELA FE 1/20) 1-20 MG-MCG tablet Take 1 tablet by mouth daily.    . DULoxetine (CYMBALTA) 60 MG capsule Take 1 capsule (60 mg total) by mouth daily. 90 capsule 0  . HYDROcodone-acetaminophen (NORCO/VICODIN) 5-325 MG tablet Take 1 tablet by mouth every 4 (four) hours as needed for moderate pain. 20 tablet 0  . hydrOXYzine (ATARAX/VISTARIL) 50 MG tablet Take 1.5-2 tablets (75-100 mg total) by mouth at bedtime as needed (sleep). 180 tablet 0  . LamoTRIgine 200 MG TB24 24 hour tablet Take 1 tablet (200 mg total) by mouth daily. 15 tablet 0  . [START ON 06/09/2020] LamoTRIgine 250 MG  TB24 24 hour tablet Take 250 mg by mouth daily. Dosage increase - stop 200 mg when starting this dosage in 15 days from now. 30 tablet 1  . metoprolol succinate (TOPROL-XL) 25 MG 24 hr tablet TAKE 1 TABLET BY MOUTH DAILY 30 tablet 2  . naproxen (NAPROSYN) 500 MG tablet Take 1 tablet (500 mg total) by mouth 2 (two) times daily with a meal. 20 tablet 2  . norgestimate-ethinyl estradiol (ORTHO-CYCLEN) 0.25-35 MG-MCG tablet Take 1 tablet by mouth daily. 3 Package 3  . QUEtiapine (SEROQUEL) 100 MG tablet Take 1.5 tablets (150 mg total) by mouth at bedtime. 135 tablet 0   No current facility-administered medications for this visit.     Musculoskeletal: Strength & Muscle Tone: UTA Gait & Station: Observed as seated Patient leans: N/A  Psychiatric Specialty Exam: Review of Systems  Musculoskeletal:       Left ankle fracture  Psychiatric/Behavioral:       Irritable, mood swings  All other systems reviewed and are negative.   not currently breastfeeding.There is no height or weight on file to calculate BMI.  General Appearance: Casual  Eye Contact:  Fair  Speech:  Normal Rate  Volume:  Normal  Mood:   mood swings, irritable  Affect:  Congruent  Thought Process:  Goal Directed and Descriptions of Associations: Intact  Orientation:  Full (Time, Place, and Person)  Thought Content: Logical   Suicidal Thoughts:  No  Homicidal Thoughts:  No  Memory:  Immediate;   Fair Recent;   Fair Remote;   Fair  Judgement:  Fair  Insight:  Fair  Psychomotor Activity:  Normal  Concentration:  Concentration: Fair and Attention Span: Fair  Recall:  [employer] of Knowledge: Fair  Language: Fair  Akathisia:  No  Handed:  Right  AIMS (if indicated):UTA  Assets:  Communication Skills Desire for Improvement Housing Social Support  ADL's:  Intact  Cognition: WNL  Sleep:  Fair   Screenings: PHQ2-9     Nutrition from 10/10/2019 in [facility] [place]  PHQ-2 Total Score 2  PHQ-9 Total Score 11       Assessment and Plan: DEVANI ODONNEL is a 24 year old Caucasian female, married, employed, lives in [place], has a history of bipolar disorder, anxiety disorder, hypertension was evaluated by telemedicine today.  Patient is biologically predisposed given her family history.  Patient with psychosocial stressors of relationship struggles in the past, being a new mother, job related stressors.  Patient is currently struggling with mood symptoms however does have psychosocial stressors as summarized above including her own health problems, recent ankle fracture.  Patient will benefit from medication readjustment.  Discussed psychotherapy referral however she declines.  Plan as noted below.  Plan Bipolar disorder type II-depressed-unstable Increase lamotrigine to extended release 200 mg p.o. daily for 15 days and then to 250 mg p.o. daily after that based on patient preference. Patient was on this dosage previously and she tolerated it well per report. Seroquel 150 mg p.o. nightly Cymbalta 60 mg p.o. daily  GAD-stable Cymbalta as prescribed  Social anxiety disorder-improving Cymbalta as  prescribed  Insomnia-stable Seroquel 150 mg p.o. nightly  Discussed referral for psychotherapy sessions due to recent stressors however she declines.  She will let writer know if she is ready.  Patient is aware about side effects to her medications including Stevens-Johnson syndrome and will continue to monitor herself.  Follow-up in clinic in 6 to 8 weeks or sooner if needed.  I have spent  atleast 20 minutes non- face to face with patient today. More than 50 % of the time was spent for preparing to see the patient ( e.g., review of test, records ),ordering medications and test ,psychoeducation and supportive psychotherapy and care coordination,as well as documenting clinical information in electronic health record. This note was generated in part or whole with voice recognition software. Voice recognition is usually quite accurate but there are transcription errors that can and very often do occur. I apologize for any typographical errors that were not detected and corrected.        Jomarie Longs, MD 05/28/2020, 9:17 AM

## 2020-06-17 ENCOUNTER — Other Ambulatory Visit: Payer: Self-pay | Admitting: Psychiatry

## 2020-06-17 DIAGNOSIS — F411 Generalized anxiety disorder: Secondary | ICD-10-CM

## 2020-06-17 DIAGNOSIS — F3176 Bipolar disorder, in full remission, most recent episode depressed: Secondary | ICD-10-CM

## 2020-06-17 DIAGNOSIS — F4010 Social phobia, unspecified: Secondary | ICD-10-CM

## 2020-06-17 DIAGNOSIS — F3181 Bipolar II disorder: Secondary | ICD-10-CM

## 2020-07-02 ENCOUNTER — Other Ambulatory Visit: Payer: Self-pay | Admitting: Family Medicine

## 2020-07-02 ENCOUNTER — Ambulatory Visit
Admission: RE | Admit: 2020-07-02 | Discharge: 2020-07-02 | Disposition: A | Discharge status: Home / Self Care | Payer: Managed Care, Other (non HMO) | Source: Ambulatory Visit | Attending: Family Medicine | Admitting: Family Medicine

## 2020-07-02 ENCOUNTER — Other Ambulatory Visit: Payer: Self-pay

## 2020-07-02 DIAGNOSIS — R000 Tachycardia, unspecified: Secondary | ICD-10-CM

## 2020-07-02 DIAGNOSIS — R0602 Shortness of breath: Secondary | ICD-10-CM | POA: Diagnosis present

## 2020-07-02 DIAGNOSIS — U071 COVID-19: Secondary | ICD-10-CM | POA: Diagnosis present

## 2020-07-02 DIAGNOSIS — R079 Chest pain, unspecified: Secondary | ICD-10-CM

## 2020-07-02 MED ORDER — IOHEXOL 350 MG/ML SOLN
75.0000 mL | Freq: Once | INTRAVENOUS | Status: AC | PRN
  Administered 2020-07-02: 75 mL via INTRAVENOUS

## 2020-07-03 ENCOUNTER — Other Ambulatory Visit
Admission: RE | Admit: 2020-07-03 | Discharge: 2020-07-03 | Disposition: A | Discharge status: Home / Self Care | Payer: Managed Care, Other (non HMO) | Source: Ambulatory Visit | Attending: Family Medicine | Admitting: Family Medicine

## 2020-07-03 DIAGNOSIS — R079 Chest pain, unspecified: Secondary | ICD-10-CM | POA: Diagnosis present

## 2020-07-03 DIAGNOSIS — R0602 Shortness of breath: Secondary | ICD-10-CM | POA: Insufficient documentation

## 2020-07-03 DIAGNOSIS — I517 Cardiomegaly: Secondary | ICD-10-CM | POA: Diagnosis not present

## 2020-07-03 LAB — TROPONIN I (HIGH SENSITIVITY): Troponin I (High Sensitivity): 4 ng/L (ref <18)

## 2020-07-07 ENCOUNTER — Telehealth (INDEPENDENT_AMBULATORY_CARE_PROVIDER_SITE_OTHER): Payer: BC Managed Care – PPO | Admitting: Psychiatry

## 2020-07-07 ENCOUNTER — Other Ambulatory Visit: Payer: Self-pay

## 2020-07-07 ENCOUNTER — Encounter: Payer: Self-pay | Admitting: Psychiatry

## 2020-07-07 DIAGNOSIS — F411 Generalized anxiety disorder: Secondary | ICD-10-CM

## 2020-07-07 DIAGNOSIS — F3176 Bipolar disorder, in full remission, most recent episode depressed: Secondary | ICD-10-CM | POA: Diagnosis not present

## 2020-07-07 DIAGNOSIS — F5105 Insomnia due to other mental disorder: Secondary | ICD-10-CM | POA: Diagnosis not present

## 2020-07-07 DIAGNOSIS — F4010 Social phobia, unspecified: Secondary | ICD-10-CM | POA: Diagnosis not present

## 2020-07-07 MED ORDER — QUETIAPINE FUMARATE 100 MG PO TABS: ORAL_TABLET | ORAL | 0 refills | Status: DC

## 2020-07-07 NOTE — Progress Notes (Signed)
Provider Location : [facility] Patient Location : [place]  Virtual Visit via Video Note  I connected with Frances Lamb on 07/07/20 at  4:00 PM EDT by a video enabled telemedicine application and verified that I am speaking with the correct person using two identifiers.   I discussed the limitations of evaluation and management by telemedicine and the availability of in person appointments. The patient expressed understanding and agreed to proceed.    I discussed the assessment and treatment plan with the patient. The patient was provided an opportunity to ask questions and all were answered. The patient agreed with the plan and demonstrated an understanding of the instructions.   The patient was advised to call back or seek an in-person evaluation if the symptoms worsen or if the condition fails to improve as anticipated.   BH MD OP Progress Note  07/07/2020 4:23 PM Frances Lamb  MRN:  [id number]  Chief Complaint:  Chief Complaint    Follow-up     HPI: Frances Lamb is a 24 year old Caucasian female, employed, lives in [place], has a history of bipolar 2 disorder, GAD, social anxiety disorder, hypertension, postpartum status post cesarean section was evaluated by telemedicine today.  Patient today reports she recently got infected with COVID-19.  She reports she is currently recovering from the same.  She reports she has mild upper respiratory symptoms however overall she is making progress.  She is planning to go back to work on Wednesday.  She reports fortunately no one else in her family including her husband and her baby  got infected.  Patient reports mood wise she is doing well on the Lamictal 250 mg in the Seroquel.  She denies any side effects to her medications.  She reports sleep as well.  Patient denies any suicidality, homicidality or perceptual disturbances.  Patient denies any other concerns today.  Visit Diagnosis:    ICD-10-CM   1. Bipolar disorder, in full  remission, most recent episode depressed (HCC)  F31.76 QUEtiapine (SEROQUEL) 100 MG tablet   type 2  2. GAD (generalized anxiety disorder)  F41.1 QUEtiapine (SEROQUEL) 100 MG tablet  3. Social anxiety disorder  F40.10   4. Insomnia due to mental disorder  F51.05     Past Psychiatric History: I have reviewed past psychiatric history from my progress note on 01/10/2020.  Past trials of Lamictal, Cymbalta, Latuda, hydroxyzine, Adderall, Vyvanse, Seroquel  Past Medical History:  Past Medical History:  Diagnosis Date  . ADHD (attention deficit hyperactivity disorder)   . Anxiety   . Bipolar 2 disorder (HCC)   . Depression   . Gestational diabetes   . Hypertension   . Migraine   . Miscarriage     Past Surgical History:  Procedure Laterality Date  . CESAREAN SECTION N/A 10/28/2019   Procedure: CESAREAN SECTION;  Surgeon: Nadara Mustard, MD;  Location: [facility] ORS;  Service: Obstetrics;  Laterality: N/A;  . TONSILLECTOMY AND ADENOIDECTOMY      Family Psychiatric History: I have reviewed family psychiatric history from my progress note on 01/10/2020  Family History:  Family History  Problem Relation Age of Onset  . Diabetes Mother   . Bipolar disorder Mother   . Anxiety disorder Mother   . Depression Mother   . OCD Mother   . Diabetes Maternal Grandfather     Social History: I have reviewed social history from my progress note on 01/10/2020 Social History   Socioeconomic History  . Marital status: Married  Spouse name: shannon  . Number of children: 1  . Years of education: Not on file  . Highest education level: Associate degree: occupational, Scientist, product/process development, or vocational program  Occupational History  . Not on file  Tobacco Use  . Smoking status: Former Smoker    Types: Cigarettes    Quit date: 11/29/2012    Years since quitting: 7.6  . Smokeless tobacco: Never Used  . Tobacco comment: current vaping weekly  Vaping Use  . Vaping Use: Every day  Substance and Sexual  Activity  . Alcohol use: Yes    Alcohol/week: 4.0 standard drinks    Types: 2 Glasses of wine, 2 Standard drinks or equivalent per week  . Drug use: Never  . Sexual activity: Not Currently    Birth control/protection: Pill  Other Topics Concern  . Not on file  Social History Narrative  . Not on file   Social Determinants of Health   Financial Resource Strain: Low Risk   . Difficulty of Paying Living Expenses: Not hard at all  Food Insecurity: No Food Insecurity  . Worried About Programme researcher, broadcasting/film/video in the Last Year: Never true  . Ran Out of Food in the Last Year: Never true  Transportation Needs: No Transportation Needs  . Lack of Transportation (Medical): No  . Lack of Transportation (Non-Medical): No  Physical Activity: Inactive  . Days of Exercise per Week: 0 days  . Minutes of Exercise per Session: 0 min  Stress: No Stress Concern Present  . Feeling of Stress : Not at all  Social Connections: Unknown  . Frequency of Communication with Friends and Family: More than three times a week  . Frequency of Social Gatherings with Friends and Family: More than three times a week  . Attends Religious Services: Never  . Active Member of Clubs or Organizations: No  . Attends Banker Meetings: Never  . Marital Status: Not on file    Allergies: No Known Allergies  Metabolic Disorder Labs: Lab Results  Component Value Date   HGBA1C 5.8 (H) 10/27/2019   MPG 119.76 10/27/2019   No results found for: PROLACTIN No results found for: CHOL, TRIG, HDL, CHOLHDL, VLDL, LDLCALC No results found for: TSH  Therapeutic Level Labs: No results found for: LITHIUM No results found for: VALPROATE No components found for:  CBMZ  Current Medications: Current Outpatient Medications  Medication Sig Dispense Refill  . albuterol (PROVENTIL) (2.5 MG/3ML) 0.083% nebulizer solution Inhale into the lungs.    Marland Kitchen albuterol (PROVENTIL) (2.5 MG/3ML) 0.083% nebulizer solution Take 2.5 mg by  nebulization every 6 (six) hours as needed.    Marland Kitchen albuterol (VENTOLIN HFA) 108 (90 Base) MCG/ACT inhaler SMARTSIG:2 Inhalation Via Inhaler Every 4 Hours PRN    . budesonide (PULMICORT) 1 MG/2ML nebulizer solution Take 1 mg by nebulization daily.    . colchicine 0.6 MG tablet Take 0.6 mg by mouth daily.    Marland Kitchen doxycycline (VIBRAMYCIN) 100 MG capsule Take 100 mg by mouth 2 (two) times daily.    . DULoxetine (CYMBALTA) 60 MG capsule Take 1 capsule (60 mg total) by mouth daily. 90 capsule 0  . HYDROcodone-acetaminophen (NORCO/VICODIN) 5-325 MG tablet Take 1 tablet by mouth every 4 (four) hours as needed for moderate pain. 20 tablet 0  . hydrOXYzine (ATARAX/VISTARIL) 50 MG tablet Take 1.5-2 tablets (75-100 mg total) by mouth at bedtime as needed (sleep). 180 tablet 0  . LamoTRIgine 250 MG TB24 24 hour tablet Take 250 mg by mouth  daily. Dosage increase - stop 200 mg when starting this dosage in 15 days from now. 30 tablet 1  . metoprolol succinate (TOPROL-XL) 25 MG 24 hr tablet TAKE 1 TABLET BY MOUTH DAILY 30 tablet 2  . naproxen (NAPROSYN) 500 MG tablet Take 1 tablet (500 mg total) by mouth 2 (two) times daily with a meal. 20 tablet 2  . norethindrone-ethinyl estradiol (AUROVELA FE 1/20) 1-20 MG-MCG tablet Take 1 tablet by mouth daily.    . norgestimate-ethinyl estradiol (ORTHO-CYCLEN) 0.25-35 MG-MCG tablet Take 1 tablet by mouth daily. 3 Package 3  . predniSONE (DELTASONE) 20 MG tablet Take by mouth.    . QUEtiapine (SEROQUEL) 100 MG tablet TAKE 1 AND 1/2 TABLETS(150 MG) BY MOUTH AT BEDTIME 135 tablet 0   No current facility-administered medications for this visit.     Musculoskeletal: Strength & Muscle Tone: UTA Gait & Station: normal Patient leans: N/A  Psychiatric Specialty Exam: Review of Systems  Respiratory: Positive for cough.   Psychiatric/Behavioral: Negative for agitation, behavioral problems, confusion, decreased concentration, dysphoric mood, hallucinations, self-injury, sleep  disturbance and suicidal ideas. The patient is not nervous/anxious and is not hyperactive.   All other systems reviewed and are negative.   Last menstrual period 06/25/2020, not currently breastfeeding.There is no height or weight on file to calculate BMI.  General Appearance: Casual  Eye Contact:  Fair  Speech:  Clear and Coherent  Volume:  Normal  Mood:  Euthymic  Affect:  Appropriate  Thought Process:  Goal Directed and Descriptions of Associations: Intact  Orientation:  Full (Time, Place, and Person)  Thought Content: Logical   Suicidal Thoughts:  No  Homicidal Thoughts:  No  Memory:  Immediate;   Fair Recent;   Fair Remote;   Fair  Judgement:  Fair  Insight:  Fair  Psychomotor Activity:  Normal  Concentration:  Concentration: Fair and Attention Span: Fair  Recall:  [employer]Fair  Fund of Knowledge: Fair  Language: Fair  Akathisia:  No  Handed:  Right  AIMS (if indicated):UTA  Assets:  Communication Skills Desire for Improvement Housing Social Support  ADL's:  Intact  Cognition: WNL  Sleep:  Fair   Screenings: PHQ2-9     N[facility]2019 [facility] [place]  PHQ-2 Total Score 2  PHQ-9 Total Score 11       Assessment and Plan: Frances Lamb is a 24 year old Caucasian female, married, employed, lives in [place], has a history of bipolar disorder, anxiety disorder, hypertension was evaluated by telemedicine today.  Patient is biologically predisposed given her family history.  Patient with psychosocial stressors of relationship struggles in the past, being a new mother, job related stressors.  Patient is currently recovering from COVID-19 infection otherwise doing well with regards to her mood.  Plan as noted below.  Plan Bipolar disorder type II-improving Lamotrigine 250 mg p.o. daily. Seroquel 150 mg p.o. nightly Cymbalta 60 mg p.o. daily  GAD-stable Cymbalta as prescribed  Social anxiety disorder-improving Cymbalta as  prescribed  Insomnia-stable Seroquel 150 mg p.o. nightly  Follow-up in clinic in 2 to 3 months or sooner if needed.  I have spent atleast 20 minutes non face to face with patient today. More than 50 % of the time was spent for preparing to see the patient ( e.g., review of test, records ), obtaining and to review and separately obtained history , ordering medications and test ,psychoeducation and supportive psychotherapy and care coordination,as well as documenting clinical information in electronic health record. This  note was generated in part or whole with voice recognition software. Voice recognition is usually quite accurate but there are transcription errors that can and very often do occur. I apologize for any typographical errors that were not detected and corrected.      Jomarie Longs, MD 07/07/2020, 4:23 PM

## 2020-09-11 ENCOUNTER — Telehealth (INDEPENDENT_AMBULATORY_CARE_PROVIDER_SITE_OTHER): Payer: 59 | Admitting: Psychiatry

## 2020-09-11 ENCOUNTER — Other Ambulatory Visit: Payer: Self-pay

## 2020-09-11 ENCOUNTER — Encounter: Payer: Self-pay | Admitting: Psychiatry

## 2020-09-11 DIAGNOSIS — F3181 Bipolar II disorder: Secondary | ICD-10-CM | POA: Diagnosis not present

## 2020-09-11 DIAGNOSIS — F411 Generalized anxiety disorder: Secondary | ICD-10-CM | POA: Diagnosis not present

## 2020-09-11 DIAGNOSIS — F4010 Social phobia, unspecified: Secondary | ICD-10-CM | POA: Diagnosis not present

## 2020-09-11 DIAGNOSIS — F3176 Bipolar disorder, in full remission, most recent episode depressed: Secondary | ICD-10-CM

## 2020-09-11 DIAGNOSIS — F5105 Insomnia due to other mental disorder: Secondary | ICD-10-CM | POA: Diagnosis not present

## 2020-09-11 MED ORDER — LAMOTRIGINE ER 250 MG PO TB24: 250.0000 mg | ORAL_TABLET | Freq: Every day | ORAL | 1 refills | Status: DC

## 2020-09-11 MED ORDER — LAMOTRIGINE ER 25 MG PO TB24: 25.0000 mg | ORAL_TABLET | Freq: Every day | ORAL | 1 refills | Status: DC

## 2020-09-11 MED ORDER — DULOXETINE HCL 60 MG PO CPEP: 60.0000 mg | ORAL_CAPSULE | Freq: Every day | ORAL | 0 refills | Status: DC

## 2020-09-11 MED ORDER — QUETIAPINE FUMARATE 100 MG PO TABS: ORAL_TABLET | ORAL | 0 refills | Status: DC

## 2020-09-11 NOTE — Progress Notes (Signed)
Provider Location : [facility] Patient Location : [place]  Participants: Patient , Provider  Virtual Visit via Video Note  I connected with Frances Lamb on 09/11/20 at  4:00 PM EDT by a video enabled telemedicine application and verified that I am speaking with the correct person using two identifiers.   I discussed the limitations of evaluation and management by telemedicine and the availability of in person appointments. The patient expressed understanding and agreed to proceed.    I discussed the assessment and treatment plan with the patient. The patient was provided an opportunity to ask questions and all were answered. The patient agreed with the plan and demonstrated an understanding of the instructions.   The patient was advised to call back or seek an in-person evaluation if the symptoms worsen or if the condition fails to improve as anticipated.   BH MD OP Progress Note  09/11/2020 4:26 PM Frances Lamb  MRN:  [id number]  Chief Complaint:  Chief Complaint    Follow-up     HPI: Frances Lamb is a 24 year old Caucasian female, employed, lives in [place], has a history of bipolar 2 disorder, GAD, social anxiety disorder, hypertension, postpartum status post cesarean section was evaluated by telemedicine today.  Patient today reports she is currently struggling with job related stressors.  She reports she has applied for another job and is currently going through the hiring process.  She reports she is struggling with the death of her grandmother.  She does have some episodes of feeling sad.  She also reports a recent episode of feeling high and wanting to go on a spending spree however she was able to stop herself from doing that.  She however is interested in increasing her Lamictal.  She is currently on 250 mg.  Patient denies any sleep problems.  She denies any suicidality, homicidality or perceptual disturbances.  Patient denies any other concerns today.  Visit Diagnosis:     ICD-10-CM   1. Bipolar 2 disorder (HCC)  F31.81 LamoTRIgine (LAMICTAL XR) 25 MG TB24 24 hour tablet   mixed, mild  2. GAD (generalized anxiety disorder)  F41.1 DULoxetine (CYMBALTA) 60 MG capsule    LamoTRIgine 250 MG TB24 24 hour tablet    QUEtiapine (SEROQUEL) 100 MG tablet  3. Social anxiety disorder  F40.10 QUEtiapine (SEROQUEL) 100 MG tablet  4. Insomnia due to mental disorder  F51.05     Past Psychiatric History: I have reviewed past psychiatric history from my progress note on 01/10/2020.  Past trials of Lamictal, Cymbalta, Latuda, hydroxyzine, Adderall, Vyvanse, Seroquel  Past Medical History:  Past Medical History:  Diagnosis Date  . ADHD (attention deficit hyperactivity disorder)   . Anxiety   . Bipolar 2 disorder (HCC)   . Depression   . Gestational diabetes   . Hypertension   . Migraine   . Miscarriage     Past Surgical History:  Procedure Laterality Date  . CESAREAN SECTION N/A 10/28/2019   Procedure: CESAREAN SECTION;  Surgeon: Nadara Mustard, MD;  Location: [facility] ORS;  Service: Obstetrics;  Laterality: N/A;  . TONSILLECTOMY AND ADENOIDECTOMY      Family Psychiatric History: I have reviewed family psychiatric history from my progress note on 01/10/2020  Family History:  Family History  Problem Relation Age of Onset  . Diabetes Mother   . Bipolar disorder Mother   . Anxiety disorder Mother   . Depression Mother   . OCD Mother   . Diabetes Maternal Grandfather  Social History: I have reviewed social history from my progress note on 01/10/2020 Social History   Socioeconomic History  . Marital status: Married    Spouse name: shannon  . Number of children: 1  . Years of education: Not on file  . Highest education level: Associate degree: occupational, Scientist, product/process development, or vocational program  Occupational History  . Not on file  Tobacco Use  . Smoking status: Former Smoker    Types: Cigarettes    Quit date: 11/29/2012    Years since quitting: 7.7  .  Smokeless tobacco: Never Used  . Tobacco comment: current vaping weekly  Vaping Use  . Vaping Use: Every day  Substance and Sexual Activity  . Alcohol use: Yes    Alcohol/week: 4.0 standard drinks    Types: 2 Glasses of wine, 2 Standard drinks or equivalent per week  . Drug use: Never  . Sexual activity: Not Currently    Birth control/protection: Pill  Other Topics Concern  . Not on file  Social History Narrative  . Not on file   Social Determinants of Health   Financial Resource Strain: Low Risk   . Difficulty of Paying Living Expenses: Not hard at all  Food Insecurity: No Food Insecurity  . Worried About Programme researcher, broadcasting/film/video in the Last Year: Never true  . Ran Out of Food in the Last Year: Never true  Transportation Needs: No Transportation Needs  . Lack of Transportation (Medical): No  . Lack of Transportation (Non-Medical): No  Physical Activity: Inactive  . Days of Exercise per Week: 0 days  . Minutes of Exercise per Session: 0 min  Stress: No Stress Concern Present  . Feeling of Stress : Not at all  Social Connections: Unknown  . Frequency of Communication with Friends and Family: More than three times a week  . Frequency of Social Gatherings with Friends and Family: More than three times a week  . Attends Religious Services: Never  . Active Member of Clubs or Organizations: No  . Attends Banker Meetings: Never  . Marital Status: Not on file    Allergies: No Known Allergies  Metabolic Disorder Labs: Lab Results  Component Value Date   HGBA1C 5.8 (H) 10/27/2019   MPG 119.76 10/27/2019   No results found for: PROLACTIN No results found for: CHOL, TRIG, HDL, CHOLHDL, VLDL, LDLCALC No results found for: TSH  Therapeutic Level Labs: No results found for: LITHIUM No results found for: VALPROATE No components found for:  CBMZ  Current Medications: Current Outpatient Medications  Medication Sig Dispense Refill  . albuterol (PROVENTIL) (2.5  MG/3ML) 0.083% nebulizer solution Take 2.5 mg by nebulization every 6 (six) hours as needed.    Marland Kitchen albuterol (PROVENTIL) (2.5 MG/3ML) 0.083% nebulizer solution Inhale into the lungs.    Marland Kitchen albuterol (VENTOLIN HFA) 108 (90 Base) MCG/ACT inhaler SMARTSIG:2 Inhalation Via Inhaler Every 4 Hours PRN    . budesonide (PULMICORT) 1 MG/2ML nebulizer solution Take 1 mg by nebulization daily.    . colchicine 0.6 MG tablet Take 0.6 mg by mouth daily.    Marland Kitchen doxycycline (VIBRAMYCIN) 100 MG capsule Take 100 mg by mouth 2 (two) times daily.    . DULoxetine (CYMBALTA) 60 MG capsule Take 1 capsule (60 mg total) by mouth daily. 90 capsule 0  . HYDROcodone-acetaminophen (NORCO/VICODIN) 5-325 MG tablet Take 1 tablet by mouth every 4 (four) hours as needed for moderate pain. 20 tablet 0  . hydrOXYzine (ATARAX/VISTARIL) 50 MG tablet Take 1.5-2 tablets (  75-100 mg total) by mouth at bedtime as needed (sleep). 180 tablet 0  . LamoTRIgine (LAMICTAL XR) 25 MG TB24 24 hour tablet Take 1 tablet (25 mg total) by mouth daily. To be taken with 250 mg tablet 30 tablet 1  . LamoTRIgine 250 MG TB24 24 hour tablet Take 250 mg by mouth daily. To be combined with 25 mg 30 tablet 1  . metoprolol succinate (TOPROL-XL) 25 MG 24 hr tablet TAKE 1 TABLET BY MOUTH DAILY 30 tablet 2  . naproxen (NAPROSYN) 500 MG tablet Take 1 tablet (500 mg total) by mouth 2 (two) times daily with a meal. 20 tablet 2  . norethindrone-ethinyl estradiol (AUROVELA FE 1/20) 1-20 MG-MCG tablet Take 1 tablet by mouth daily.    . norgestimate-ethinyl estradiol (ORTHO-CYCLEN) 0.25-35 MG-MCG tablet Take 1 tablet by mouth daily. 3 Package 3  . predniSONE (DELTASONE) 20 MG tablet Take by mouth.    . QUEtiapine (SEROQUEL) 100 MG tablet TAKE 1 AND 1/2 TABLETS(150 MG) BY MOUTH AT BEDTIME 135 tablet 0   No current facility-administered medications for this visit.     Musculoskeletal: Strength & Muscle Tone: UTA Gait & Station: seated Patient leans: N/A  Psychiatric  Specialty Exam: Review of Systems  Psychiatric/Behavioral: Positive for dysphoric mood.  All other systems reviewed and are negative.   not currently breastfeeding.There is no height or weight on file to calculate BMI.  General Appearance: Casual  Eye Contact:  Fair  Speech:  Normal Rate  Volume:  Normal  Mood:  Dysphoric, recent hypomania  Affect:  Congruent  Thought Process:  Goal Directed and Descriptions of Associations: Intact  Orientation:  Full (Time, Place, and Person)  Thought Content: Logical   Suicidal Thoughts:  No  Homicidal Thoughts:  No  Memory:  Immediate;   Fair Recent;   Fair Remote;   Fair  Judgement:  Fair  Insight:  Fair  Psychomotor Activity:  Normal  Concentration:  Concentration: Fair and Attention Span: Fair  Recall:  [employer] of Knowledge: Fair  Language: Fair  Akathisia:  No  Handed:  Right  AIMS (if indicated): UTA  Assets:  Communication Skills Desire for Improvement Social Support  ADL's:  Intact  Cognition: WNL  Sleep:  Fair   Screenings: PHQ2-9     Nutrition from 10/10/2019 in [facility] [place]  PHQ-2 Total Score 2  PHQ-9 Total Score 11       Assessment and Plan: SHAUNTA ONCALE is a 24 year old Caucasian female, married, employed, has a history of bipolar disorder, anxiety disorder, hypertension was evaluated by telemedicine today.  Patient is biologically predisposed given her family history.  Patient with psychosocial stressors of relationship struggles in the past, being a new mother, job related stressors.  Patient is also struggling with the grief of the death of her grandmother.  She will benefit from medication readjustment.  Plan as noted below.  Plan Bipolar disorder type II-unstable Increase lamotrigine to 275 mg po daily Seroquel 150 mg p.o. nightly Cymbalta 60 mg p.o. daily  GAD-stable Cymbalta as prescribed  Social anxiety disorder-improving Cymbalta as prescribed   Insomnia-stable Seroquel 150  mg p.o. nightly  Advised patient to start psychotherapy sessions/grief counseling as needed.  Follow-up in clinic in 4 weeks or sooner if needed.  I have spent atleast 20 minutes face to face by video with patient today. More than 50 % of the time was spent for preparing to see the patient ( e.g., review of test, records ),  ordering medications and test ,psychoeducation and supportive psychotherapy and care coordination,as well as documenting clinical information in electronic health record. This note was generated in part or whole with voice recognition software. Voice recognition is usually quite accurate but there are transcription errors that can and very often do occur. I apologize for any typographical errors that were not detected and corrected.     Jomarie LongsSaramma Pascha Fogal, MD 09/12/2020, 8:54 AM

## 2020-10-14 ENCOUNTER — Telehealth (INDEPENDENT_AMBULATORY_CARE_PROVIDER_SITE_OTHER): Payer: 59 | Admitting: Psychiatry

## 2020-10-14 ENCOUNTER — Encounter: Payer: Self-pay | Admitting: Psychiatry

## 2020-10-14 ENCOUNTER — Other Ambulatory Visit: Payer: Self-pay

## 2020-10-14 DIAGNOSIS — F4010 Social phobia, unspecified: Secondary | ICD-10-CM

## 2020-10-14 DIAGNOSIS — F5105 Insomnia due to other mental disorder: Secondary | ICD-10-CM | POA: Diagnosis not present

## 2020-10-14 DIAGNOSIS — F3178 Bipolar disorder, in full remission, most recent episode mixed: Secondary | ICD-10-CM

## 2020-10-14 DIAGNOSIS — F3181 Bipolar II disorder: Secondary | ICD-10-CM

## 2020-10-14 DIAGNOSIS — F411 Generalized anxiety disorder: Secondary | ICD-10-CM | POA: Diagnosis not present

## 2020-10-14 DIAGNOSIS — R37 Sexual dysfunction, unspecified: Secondary | ICD-10-CM | POA: Insufficient documentation

## 2020-10-14 MED ORDER — BUPROPION HCL ER (SR) 150 MG PO TB12: 150.0000 mg | ORAL_TABLET | Freq: Every day | ORAL | 0 refills | Status: DC

## 2020-10-14 NOTE — Patient Instructions (Signed)
Bupropion sustained-release tablets (Depression/Mood Disorders) What is this medicine? BUPROPION (byoo PROE pee on) is used to treat depression. This medicine may be used for other purposes; ask your health care provider or pharmacist if you have questions. COMMON BRAND NAME(S): Budeprion SR, Wellbutrin SR What should I tell my health care provider before I take this medicine? They need to know if you have any of these conditions:  an eating disorder, such as anorexia or bulimia  bipolar disorder or psychosis  diabetes or high blood sugar, treated with medication  glaucoma  head injury or brain tumor  heart disease, previous heart attack, or irregular heart beat  high blood pressure  kidney or liver disease  seizures  suicidal thoughts or a previous suicide attempt  Tourette's syndrome  weight loss  an unusual or allergic reaction to bupropion, other medicines, foods, dyes, or preservatives  breast-feeding  pregnant or trying to become pregnant How should I use this medicine? Take this medicine by mouth with a glass of water. Follow the directions on the prescription label. You can take it with or without food. If it upsets your stomach, take it with food. Do not cut, crush or chew this medicine. Take your medicine at regular intervals. If you take this medicine more than once a day, take your second dose at least 8 hours after you take your first dose. To limit difficulty in sleeping, avoid taking this medicine at bedtime. Do not take your medicine more often than directed. Do not stop taking this medicine suddenly except upon the advice of your doctor. Stopping this medicine too quickly may cause serious side effects or your condition may worsen. A special MedGuide will be given to you by the pharmacist with each prescription and refill. Be sure to read this information carefully each time. Talk to your pediatrician regarding the use of this medicine in children. Special  care may be needed. Overdosage: If you think you have taken too much of this medicine contact a poison control center or emergency room at once. NOTE: This medicine is only for you. Do not share this medicine with others. What if I miss a dose? If you miss a dose, skip the missed dose and take your next tablet at the regular time. There should be at least 8 hours between doses. Do not take double or extra doses. What may interact with this medicine? Do not take this medicine with any of the following medications:  linezolid  MAOIs like Azilect, Carbex, Eldepryl, Marplan, Nardil, and Parnate  methylene blue (injected into a vein)  other medicines that contain bupropion like Zyban This medicine may also interact with the following medications:  alcohol  certain medicines for anxiety or sleep  certain medicines for blood pressure like metoprolol, propranolol  certain medicines for depression or psychotic disturbances  certain medicines for HIV or AIDS like efavirenz, lopinavir, nelfinavir, ritonavir  certain medicines for irregular heart beat like propafenone, flecainide  certain medicines for Parkinson's disease like amantadine, levodopa  certain medicines for seizures like carbamazepine, phenytoin, phenobarbital  cimetidine  clopidogrel  cyclophosphamide  digoxin  furazolidone  isoniazid  nicotine  orphenadrine  procarbazine  steroid medicines like prednisone or cortisone  stimulant medicines for attention disorders, weight loss, or to stay awake  tamoxifen  theophylline  thiotepa  ticlopidine  tramadol  warfarin This list may not describe all possible interactions. Give your health care provider a list of all the medicines, herbs, non-prescription drugs, or dietary supplements you use. Also   tell them if you smoke, drink alcohol, or use illegal drugs. Some items may interact with your medicine. What should I watch for while using this medicine? Tell  your doctor if your symptoms do not get better or if they get worse. Visit your doctor or healthcare provider for regular checks on your progress. Because it may take several weeks to see the full effects of this medicine, it is important to continue your treatment as prescribed by your doctor. This medicine may cause serious skin reactions. They can happen weeks to months after starting the medicine. Contact your healthcare provider right away if you notice fevers or flu-like symptoms with a rash. The rash may be red or purple and then turn into blisters or peeling of the skin. Or, you might notice a red rash with swelling of the face, lips or lymph nodes in your neck or under your arms. Patients and their families should watch out for new or worsening thoughts of suicide or depression. Also watch out for sudden changes in feelings such as feeling anxious, agitated, panicky, irritable, hostile, aggressive, impulsive, severely restless, overly excited and hyperactive, or not being able to sleep. If this happens, especially at the beginning of treatment or after a change in dose, call your healthcare provider. Avoid alcoholic drinks while taking this medicine. Drinking excessive alcoholic beverages, using sleeping or anxiety medicines, or quickly stopping the use of these agents while taking this medicine may increase your risk for a seizure. Do not drive or use heavy machinery until you know how this medicine affects you. This medicine can impair your ability to perform these tasks. Do not take this medicine close to bedtime. It may prevent you from sleeping. Your mouth may get dry. Chewing sugarless gum or sucking hard candy, and drinking plenty of water may help. Contact your doctor if the problem does not go away or is severe. What side effects may I notice from receiving this medicine? Side effects that you should report to your doctor or health care professional as soon as possible:  allergic  reactions like skin rash, itching or hives, swelling of the face, lips, or tongue  breathing problems  changes in vision  confusion  elevated mood, decreased need for sleep, racing thoughts, impulsive behavior  fast or irregular heartbeat  hallucinations, loss of contact with reality  increased blood pressure  rash, fever, and swollen lymph nodes  redness, blistering, peeling or loosening of the skin, including inside the mouth  seizures  suicidal thoughts or other mood changes  unusually weak or tired  vomiting Side effects that usually do not require medical attention (report to your doctor or health care professional if they continue or are bothersome):  constipation  headache  loss of appetite  nausea  tremors  weight loss This list may not describe all possible side effects. Call your doctor for medical advice about side effects. You may report side effects to FDA at 1-800-FDA-1088. Where should I keep my medicine? Keep out of the reach of children. Store at room temperature between 20 and 25 degrees C (68 and 77 degrees F), away from direct sunlight and moisture. Keep tightly closed. Throw away any unused medicine after the expiration date. NOTE: This sheet is a summary. It may not cover all possible information. If you have questions about this medicine, talk to your doctor, pharmacist, or health care provider.  2020 Elsevier/Gold Standard (2019-02-08 13:55:14)  

## 2020-10-14 NOTE — Progress Notes (Signed)
Virtual Visit via Video Note  I connected with Glade Nurse on 10/14/20 at  4:20 PM EST by a video enabled telemedicine application and verified that I am speaking with the correct person using two identifiers.  Location Provider Location : [facility] Patient Location : Home  Participants: Patient , Provider   I discussed the limitations of evaluation and management by telemedicine and the availability of in person appointments. The patient expressed understanding and agreed to proceed.  I discussed the assessment and treatment plan with the patient. The patient was provided an opportunity to ask questions and all were answered. The patient agreed with the plan and demonstrated an understanding of the instructions.  The patient was advised to call back or seek an in-person evaluation if the symptoms worsen or if the condition fails to improve as anticipated.  Video connection was lost at less than 50% of the duration of the visit, at which time the remainder of the visit was completed through audio only    [facility] MD OP Progress Note  10/14/2020 4:36 PM BRIANDA Lamb  MRN:  [id number]  Chief Complaint:  Chief Complaint    Follow-up     HPI: Frances Lamb is a 24 year old Caucasian female, employed, lives in [place], has a history of bipolar 2 disorder, GAD, social anxiety disorder, hypertension, postpartum status post cesarean section was evaluated by telemedicine today.  Patient today reports her mood symptoms is improving.  She reports the Lamictal at this dosage with the other medication as very helpful for her mood swings.  She denies any significant sadness or crying spells.  She reports sleep is good.  Patient however reports she is currently struggling with sexual side effects of the medication.  She reports she is not interested in reducing the dosage of any of her medications and wants to stay on the same dose.  She however is interested in trying bupropion.  Patient denies any  suicidality, homicidality or perceptual disturbances.  She reports she is currently in the process of interviewing with another job.  She wants to switch to daytime.  Patient denies any other concerns today.  Visit Diagnosis:    ICD-10-CM   1. Bipolar disorder, in full remission, most recent episode mixed (HCC)  F31.78    mixed, mild  2. GAD (generalized anxiety disorder)  F41.1   3. Social anxiety disorder  F40.10   4. Insomnia due to mental disorder  F51.05   5. Sexual dysfunction  R37 buPROPion (WELLBUTRIN SR) 150 MG 12 hr tablet   likely due to psychotropics    Past Psychiatric History: I have reviewed past psychiatric history from my progress note on 01/10/2020.  Past trials of Lamictal, Cymbalta, Latuda, hydroxyzine, Adderall, Vyvanse, Seroquel  Past Medical History:  Past Medical History:  Diagnosis Date  . ADHD (attention deficit hyperactivity disorder)   . Anxiety   . Bipolar 2 disorder (HCC)   . Depression   . Gestational diabetes   . Hypertension   . Migraine   . Miscarriage     Past Surgical History:  Procedure Laterality Date  . CESAREAN SECTION N/A 10/28/2019   Procedure: CESAREAN SECTION;  Surgeon: Nadara Mustard, MD;  Location: [facility] ORS;  Service: Obstetrics;  Laterality: N/A;  . TONSILLECTOMY AND ADENOIDECTOMY      Family Psychiatric History: I have reviewed family psychiatric history from my progress note on 01/10/2020  Family History:  Family History  Problem Relation Age of Onset  . Diabetes Mother   .  Bipolar disorder Mother   . Anxiety disorder Mother   . Depression Mother   . OCD Mother   . Diabetes Maternal Grandfather     Social History: I have reviewed social history from my progress note on 01/10/2020 Social History   Socioeconomic History  . Marital status: Married    Spouse name: shannon  . Number of children: 1  . Years of education: Not on file  . Highest education level: Associate degree: occupational, Scientist, product/process development, or vocational  program  Occupational History  . Not on file  Tobacco Use  . Smoking status: Former Smoker    Types: Cigarettes    Quit date: 11/29/2012    Years since quitting: 7.8  . Smokeless tobacco: Never Used  . Tobacco comment: current vaping weekly  Vaping Use  . Vaping Use: Every day  Substance and Sexual Activity  . Alcohol use: Yes    Alcohol/week: 4.0 standard drinks    Types: 2 Glasses of wine, 2 Standard drinks or equivalent per week  . Drug use: Never  . Sexual activity: Not Currently    Birth control/protection: Pill  Other Topics Concern  . Not on file  Social History Narrative  . Not on file   Social Determinants of Health   Financial Resource Strain: Low Risk   . Difficulty of Paying Living Expenses: Not hard at all  Food Insecurity: No Food Insecurity  . Worried About Programme researcher, broadcasting/film/video in the Last Year: Never true  . Ran Out of Food in the Last Year: Never true  Transportation Needs: No Transportation Needs  . Lack of Transportation (Medical): No  . Lack of Transportation (Non-Medical): No  Physical Activity: Inactive  . Days of Exercise per Week: 0 days  . Minutes of Exercise per Session: 0 min  Stress: No Stress Concern Present  . Feeling of Stress : Not at all  Social Connections: Unknown  . Frequency of Communication with Friends and Family: More than three times a week  . Frequency of Social Gatherings with Friends and Family: More than three times a week  . Attends Religious Services: Never  . Active Member of Clubs or Organizations: No  . Attends Banker Meetings: Never  . Marital Status: Not on file    Allergies: No Known Allergies  Metabolic Disorder Labs: Lab Results  Component Value Date   HGBA1C 5.8 (H) 10/27/2019   MPG 119.76 10/27/2019   No results found for: PROLACTIN No results found for: CHOL, TRIG, HDL, CHOLHDL, VLDL, LDLCALC No results found for: TSH  Therapeutic Level Labs: No results found for: LITHIUM No results  found for: VALPROATE No components found for:  CBMZ  Current Medications: Current Outpatient Medications  Medication Sig Dispense Refill  . albuterol (PROVENTIL) (2.5 MG/3ML) 0.083% nebulizer solution Take 2.5 mg by nebulization every 6 (six) hours as needed.    Marland Kitchen albuterol (PROVENTIL) (2.5 MG/3ML) 0.083% nebulizer solution Inhale into the lungs.    Marland Kitchen albuterol (VENTOLIN HFA) 108 (90 Base) MCG/ACT inhaler SMARTSIG:2 Inhalation Via Inhaler Every 4 Hours PRN    . budesonide (PULMICORT) 1 MG/2ML nebulizer solution Take 1 mg by nebulization daily.    Marland Kitchen buPROPion (WELLBUTRIN SR) 150 MG 12 hr tablet Take 1 tablet (150 mg total) by mouth daily with breakfast. 90 tablet 0  . colchicine 0.6 MG tablet Take 0.6 mg by mouth daily.    Marland Kitchen doxycycline (VIBRAMYCIN) 100 MG capsule Take 100 mg by mouth 2 (two) times daily.    Marland Kitchen  DULoxetine (CYMBALTA) 60 MG capsule Take 1 capsule (60 mg total) by mouth daily. 90 capsule 0  . HYDROcodone-acetaminophen (NORCO/VICODIN) 5-325 MG tablet Take 1 tablet by mouth every 4 (four) hours as needed for moderate pain. 20 tablet 0  . hydrOXYzine (ATARAX/VISTARIL) 50 MG tablet Take 1.5-2 tablets (75-100 mg total) by mouth at bedtime as needed (sleep). 180 tablet 0  . LamoTRIgine (LAMICTAL XR) 25 MG TB24 24 hour tablet Take 1 tablet (25 mg total) by mouth daily. To be taken with 250 mg tablet 30 tablet 1  . LamoTRIgine 250 MG TB24 24 hour tablet Take 250 mg by mouth daily. To be combined with 25 mg 30 tablet 1  . metoprolol succinate (TOPROL-XL) 25 MG 24 hr tablet TAKE 1 TABLET BY MOUTH DAILY 30 tablet 2  . naproxen (NAPROSYN) 500 MG tablet Take 1 tablet (500 mg total) by mouth 2 (two) times daily with a meal. 20 tablet 2  . norethindrone-ethinyl estradiol (AUROVELA FE 1/20) 1-20 MG-MCG tablet Take 1 tablet by mouth daily.    . norgestimate-ethinyl estradiol (ORTHO-CYCLEN) 0.25-35 MG-MCG tablet Take 1 tablet by mouth daily. 3 Package 3  . predniSONE (DELTASONE) 20 MG tablet Take by  mouth.    . QUEtiapine (SEROQUEL) 100 MG tablet TAKE 1 AND 1/2 TABLETS(150 MG) BY MOUTH AT BEDTIME 135 tablet 0   No current facility-administered medications for this visit.     Musculoskeletal: Strength & Muscle Tone: UTA Gait & Station: UTA Patient leans: N/A  Psychiatric Specialty Exam: Review of Systems  Psychiatric/Behavioral: Negative for agitation, behavioral problems, confusion, decreased concentration, dysphoric mood, hallucinations, self-injury, sleep disturbance and suicidal ideas. The patient is not nervous/anxious and is not hyperactive.   All other systems reviewed and are negative.   not currently breastfeeding.There is no height or weight on file to calculate BMI.  General Appearance: UTA  Eye Contact:  UTA  Speech:  Clear and Coherent  Volume:  Normal  Mood:  Euthymic  Affect:  UTA  Thought Process:  Goal Directed and Descriptions of Associations: Intact  Orientation:  Full (Time, Place, and Person)  Thought Content: Logical   Suicidal Thoughts:  No  Homicidal Thoughts:  No  Memory:  Immediate;   Fair Recent;   Fair Remote;   Fair  Judgement:  Fair  Insight:  Fair  Psychomotor Activity:  UTA  Concentration:  Concentration: Fair and Attention Span: Fair  Recall:  [employer] of Knowledge: Fair  Language: Fair  Akathisia:  No  Handed:  Right  AIMS (if indicated): UTA  Assets:  Communication Skills Desire for Improvement Housing Social Support  ADL's:  Intact  Cognition: WNL  Sleep:  Fair   Screenings: PHQ2-9     Nutrition from 10/10/2019 in [facility]   PHQ-2 Total Score 2  PHQ-9 Total Score 11       Assessment and Plan: KESLYN TEATER is a 24 year old Caucasian female, married, employed, has a history of bipolar disorder, anxiety disorder, hypertension was evaluated by telemedicine today.  Patient is biologically predisposed given her family history.  Patient with psychosocial stressors of relationship struggles, being a  new mother, job related stressors.  She is currently struggling with sexual side effects of her medication and is interested in trying bupropion.  Plan as noted below.  Plan Bipolar disorder type II-in remission Lamictal 275 mg p.o. daily Seroquel 150 mg p.o. nightly Cymbalta 60 mg p.o. daily  GAD-stable Cymbalta as prescribed  Social anxiety disorder-improving Cymbalta as  prescribed  Insomnia-stable Seroquel 150 mg p.o. nightly  Sexual side effects of psychotropics-we will add Wellbutrin SR 150 mg p.o. daily.  Provided medication education, drug to drug interaction with Cymbalta.  She will monitor herself.  Follow-up in clinic in 4 weeks or sooner if needed.  I have spent atleast 20 minutes non face to face with patient today. More than 50 % of the time was spent for preparing to see the patient ( e.g., review of test, records ), ordering medications and test ,psychoeducation and supportive psychotherapy and care coordination,as well as documenting clinical information in electronic health record. This note was generated in part or whole with voice recognition software. Voice recognition is usually quite accurate but there are transcription errors that can and very often do occur. I apologize for any typographical errors that were not detected and corrected.        Jomarie LongsSaramma Keana Dueitt, MD 10/15/2020, 9:04 AM

## 2020-11-11 ENCOUNTER — Other Ambulatory Visit: Payer: Self-pay

## 2020-11-11 ENCOUNTER — Telehealth (INDEPENDENT_AMBULATORY_CARE_PROVIDER_SITE_OTHER): Payer: 59 | Admitting: Psychiatry

## 2020-11-11 ENCOUNTER — Encounter: Payer: Self-pay | Admitting: Psychiatry

## 2020-11-11 DIAGNOSIS — R37 Sexual dysfunction, unspecified: Secondary | ICD-10-CM

## 2020-11-11 DIAGNOSIS — F4010 Social phobia, unspecified: Secondary | ICD-10-CM | POA: Diagnosis not present

## 2020-11-11 DIAGNOSIS — F3178 Bipolar disorder, in full remission, most recent episode mixed: Secondary | ICD-10-CM

## 2020-11-11 DIAGNOSIS — F5105 Insomnia due to other mental disorder: Secondary | ICD-10-CM | POA: Diagnosis not present

## 2020-11-11 DIAGNOSIS — F411 Generalized anxiety disorder: Secondary | ICD-10-CM

## 2020-11-11 MED ORDER — LAMOTRIGINE ER 250 MG PO TB24
250.0000 mg | ORAL_TABLET | Freq: Every day | ORAL | 0 refills | Status: DC
Start: 2020-11-11 — End: 2021-01-15

## 2020-11-11 MED ORDER — LAMOTRIGINE ER 25 MG PO TB24: 25.0000 mg | ORAL_TABLET | Freq: Every day | ORAL | 0 refills | Status: DC

## 2020-11-11 MED ORDER — QUETIAPINE FUMARATE 100 MG PO TABS: ORAL_TABLET | ORAL | 0 refills | Status: DC

## 2020-11-11 MED ORDER — DULOXETINE HCL 30 MG PO CPEP: 30.0000 mg | ORAL_CAPSULE | Freq: Every day | ORAL | 0 refills | Status: DC

## 2020-11-11 NOTE — Progress Notes (Signed)
Virtual Visit via Video Note  I connected with Glade NurseErin E Supan on 11/11/20 at  4:40 PM EST by a video enabled telemedicine application and verified that I am speaking with the correct person using two identifiers.  Location Provider Location : [facility] Patient Location : Car  Participants: Patient , Provider    I discussed the limitations of evaluation and management by telemedicine and the availability of in person appointments. The patient expressed understanding and agreed to proceed.   I discussed the assessment and treatment plan with the patient. The patient was provided an opportunity to ask questions and all were answered. The patient agreed with the plan and demonstrated an understanding of the instructions.   The patient was advised to call back or seek an in-person evaluation if the symptoms worsen or if the condition fails to improve as anticipated.  Video connection was lost at less than 50% of the duration of the visit, at which time the remainder of the visit was completed through audio only    [facility] Progress Note  11/11/2020 5:23 PM Glade Nurserin E Landin  MRN:  [id number]  Chief Complaint:  Chief Complaint    Follow-up     HPI: Glade Nurserin E Hosmer is a 24 year old Caucasian female, employed, [place], has a history of bipolar 2 disorder, GAD, social anxiety disorder, hypertension, was evaluated by telemedicine today.  Patient today reports she is currently doing okay with regards to her mood.  Denies any significant depression or anxiety symptoms.  She reports sleep is improving.  She continues to be compliant on her medications.  Patient reports she however continues to struggle with sexual dysfunction, likely from the medication.  She is currently on bupropion since the past 3 weeks or so.  She has not noticed much benefit from it yet.  She denies side effects to bupropion.  Patient reports work is going okay.  [facility]  She looks forward to that.  She continues to have good support system from her family.  Patient denies any suicidality, homicidality or perceptual disturbances.  Visit Diagnosis:    ICD-10-CM   1. Bipolar disorder, in full remission, most recent episode mixed (HCC)  F31.78 DULoxetine (CYMBALTA) 30 MG capsule  2. GAD (generalized anxiety disorder)  F41.1 QUEtiapine (SEROQUEL) 100 MG tablet    LamoTRIgine 250 MG TB24 24 hour tablet  3. Social anxiety disorder  F40.10 LamoTRIgine (LAMICTAL XR) 25 MG TB24 24 hour tablet  4. Insomnia due to mental disorder  F51.05   5. Sexual dysfunction  R37     Past Psychiatric History: I have reviewed past psychiatric history from my progress note on 01/10/2020.  Past trials of medications like Lamictal, Cymbalta, Latuda, hydroxyzine, Adderall, Vyvanse, Seroquel  Past Medical History:  Past Medical History:  Diagnosis Date  . ADHD (attention deficit hyperactivity disorder)   . Anxiety   . Bipolar 2 disorder (HCC)   . Depression   . Gestational diabetes   . Hypertension   . Migraine   . Miscarriage     Past Surgical History:  Procedure Laterality Date  . CESAREAN SECTION N/A 10/28/2019   Procedure: CESAREAN SECTION;  Surgeon: Nadara MustardHarris, Robert P, MD;  Location: [facility] ORS;  Service: Obstetrics;  Laterality: N/A;  . TONSILLECTOMY AND ADENOIDECTOMY      Family Psychiatric History: Reviewed family psychiatric history from my progress note on 01/10/2020  Family History:  Family History  Problem Relation Age  of Onset  . Diabetes Mother   . Bipolar disorder Mother   . Anxiety disorder Mother   . Depression Mother   . OCD Mother   . Diabetes Maternal Grandfather     Social History: I have reviewed social history from my progress note on 01/10/2020 Social History   Socioeconomic History  . Marital status: Married    Spouse name: shannon  . Number of children: 1  . Years of education: Not on file  . Highest education level:  Associate degree: occupational, Scientist, product/process development, or vocational program  Occupational History  . Not on file  Tobacco Use  . Smoking status: Former Smoker    Types: Cigarettes    Quit date: 11/29/2012    Years since quitting: 7.9  . Smokeless tobacco: Never Used  . Tobacco comment: current vaping weekly  Vaping Use  . Vaping Use: Every day  Substance and Sexual Activity  . Alcohol use: Yes    Alcohol/week: 4.0 standard drinks    Types: 2 Glasses of wine, 2 Standard drinks or equivalent per week  . Drug use: Never  . Sexual activity: Not Currently    Birth control/protection: Pill  Other Topics Concern  . Not on file  Social History Narrative  . Not on file   Social Determinants of Health   Financial Resource Strain: Not on file  Food Insecurity: Not on file  Transportation Needs: Not on file  Physical Activity: Not on file  Stress: Not on file  Social Connections: Not on file    Allergies: No Known Allergies  Metabolic Disorder Labs: Lab Results  Component Value Date   HGBA1C 5.8 (H) 10/27/2019   MPG 119.76 10/27/2019   No results found for: PROLACTIN No results found for: CHOL, TRIG, HDL, CHOLHDL, VLDL, LDLCALC No results found for: TSH  Therapeutic Level Labs: No results found for: LITHIUM No results found for: VALPROATE No components found for:  CBMZ  Current Medications: Current Outpatient Medications  Medication Sig Dispense Refill  . albuterol (PROVENTIL) (2.5 MG/3ML) 0.083% nebulizer solution Take 2.5 mg by nebulization every 6 (six) hours as needed.    Marland Kitchen albuterol (PROVENTIL) (2.5 MG/3ML) 0.083% nebulizer solution Inhale into the lungs.    Marland Kitchen albuterol (VENTOLIN HFA) 108 (90 Base) MCG/ACT inhaler SMARTSIG:2 Inhalation Via Inhaler Every 4 Hours PRN    . budesonide (PULMICORT) 1 MG/2ML nebulizer solution Take 1 mg by nebulization daily.    Marland Kitchen buPROPion (WELLBUTRIN SR) 150 MG 12 hr tablet Take 1 tablet (150 mg total) by mouth daily with breakfast. 90 tablet 0  .  colchicine 0.6 MG tablet Take 0.6 mg by mouth daily.    Marland Kitchen doxycycline (VIBRAMYCIN) 100 MG capsule Take 100 mg by mouth 2 (two) times daily.    . DULoxetine (CYMBALTA) 30 MG capsule Take 1 capsule (30 mg total) by mouth daily. 90 capsule 0  . HYDROcodone-acetaminophen (NORCO/VICODIN) 5-325 MG tablet Take 1 tablet by mouth every 4 (four) hours as needed for moderate pain. 20 tablet 0  . hydrOXYzine (ATARAX/VISTARIL) 50 MG tablet Take 1.5-2 tablets (75-100 mg total) by mouth at bedtime as needed (sleep). 180 tablet 0  . LamoTRIgine (LAMICTAL XR) 25 MG TB24 24 hour tablet Take 1 tablet (25 mg total) by mouth daily. To be taken with 250 mg tablet 90 tablet 0  . LamoTRIgine 250 MG TB24 24 hour tablet Take 250 mg by mouth daily. To be combined with 25 mg 90 tablet 0  . metoprolol succinate (TOPROL-XL) 25 MG  24 hr tablet TAKE 1 TABLET BY MOUTH DAILY 30 tablet 2  . naproxen (NAPROSYN) 500 MG tablet Take 1 tablet (500 mg total) by mouth 2 (two) times daily with a meal. 20 tablet 2  . norethindrone-ethinyl estradiol (AUROVELA FE 1/20) 1-20 MG-MCG tablet Take 1 tablet by mouth daily.    . norgestimate-ethinyl estradiol (ORTHO-CYCLEN) 0.25-35 MG-MCG tablet Take 1 tablet by mouth daily. 3 Package 3  . predniSONE (DELTASONE) 20 MG tablet Take by mouth.    . QUEtiapine (SEROQUEL) 100 MG tablet TAKE 1 AND 1/2 TABLETS(150 MG) BY MOUTH AT BEDTIME 135 tablet 0   No current facility-administered medications for this visit.     Musculoskeletal: Strength & Muscle Tone: UTA Gait & Station: UTA Patient leans: N/A  Psychiatric Specialty Exam: Review of Systems  Psychiatric/Behavioral: Negative for agitation, confusion, decreased concentration, dysphoric mood, hallucinations, self-injury, sleep disturbance and suicidal ideas. The patient is not hyperactive.   All other systems reviewed and are negative.   not currently breastfeeding.There is no height or weight on file to calculate BMI.  General Appearance: UTA   Eye Contact:  UTA  Speech:  Clear and Coherent  Volume:  Normal  Mood:  Euthymic  Affect:  UTA  Thought Process:  Goal Directed and Descriptions of Associations: Intact  Orientation:  Full (Time, Place, and Person)  Thought Content: Logical   Suicidal Thoughts:  No  Homicidal Thoughts:  No  Memory:  Immediate;   Fair Recent;   Fair Remote;   Fair  Judgement:  Fair  Insight:  Fair  Psychomotor Activity:  UTA  Concentration:  Concentration: Fair and Attention Span: Fair  Recall:  [employer] of Knowledge: Fair  Language: Fair  Akathisia:  No  Handed:  Right  AIMS (if indicated):UTA  Assets:  Communication Skills Desire for Improvement Housing Social Support  ADL's:  Intact  Cognition: WNL  Sleep:  Fair   Screenings: PHQ2-9   Flowsheet Row Nutrition [facility] 10/10/2019 [facility] [place]  PHQ-2 Total Score 2  PHQ-9 Total Score 11       Assessment and Plan: TYJAH HAI is a 24 year old Caucasian female, married, employed, has a history of bipolar disorder, anxiety disorder, hypertension was evaluated by telemedicine today.  Patient is biologically predisposed given her family history.  Patient with psychosocial stressors of relationship struggles, being a new mother, job related stressors.  Patient continues to struggle with sexual side effects likely from medications.  Discussed plan as noted below.  Plan Bipolar disorder type II in remission Continue Lamictal 275 mg p.o. daily Seroquel 150 mg p.o. nightly   GAD-stable Will reduce Cymbalta to 30 mg p.o. daily for possible sexual side effects.  Social anxiety disorder-improving Cymbalta as prescribed  Insomnia-stable We will monitor closely  Sexual dysfunction likely due to psychotropics-unstable Wellbutrin SR 150 mg p.o. daily Discussed with patient that she can be referred for therapy however she declines. Reduce Cymbalta to 30 mg p.o. daily We will consider reducing the dose of Seroquel in  the future if she continues to struggle. Patient also advised to contact her primary care provider for further management as needed.  Follow-up in clinic in 4 weeks or sooner if needed.  I have spent atleast 20 minutes non face to face with patient today. More than 50 % of the time was spent for preparing to see the patient ( e.g., review of test, records ),  ordering medications and test ,psychoeducation and supportive psychotherapy and care coordination,as  well as documenting clinical information in electronic health record. This note was generated in part or whole with voice recognition software. Voice recognition is usually quite accurate but there are transcription errors that can and very often do occur. I apologize for any typographical errors that were not detected and corrected.       Jomarie Longs, MD 11/11/2020, 5:23 PM

## 2020-12-11 ENCOUNTER — Telehealth (INDEPENDENT_AMBULATORY_CARE_PROVIDER_SITE_OTHER): Payer: 59 | Admitting: Psychiatry

## 2020-12-11 ENCOUNTER — Encounter: Payer: Self-pay | Admitting: Psychiatry

## 2020-12-11 ENCOUNTER — Other Ambulatory Visit: Payer: Self-pay

## 2020-12-11 DIAGNOSIS — R37 Sexual dysfunction, unspecified: Secondary | ICD-10-CM

## 2020-12-11 DIAGNOSIS — F4010 Social phobia, unspecified: Secondary | ICD-10-CM

## 2020-12-11 DIAGNOSIS — F5105 Insomnia due to other mental disorder: Secondary | ICD-10-CM

## 2020-12-11 DIAGNOSIS — F3178 Bipolar disorder, in full remission, most recent episode mixed: Secondary | ICD-10-CM

## 2020-12-11 DIAGNOSIS — F411 Generalized anxiety disorder: Secondary | ICD-10-CM | POA: Diagnosis not present

## 2020-12-11 MED ORDER — DULOXETINE HCL 20 MG PO CPEP
20.0000 mg | ORAL_CAPSULE | ORAL | 0 refills | Status: DC
Start: 2020-12-11 — End: 2021-01-15

## 2020-12-11 MED ORDER — BUPROPION HCL ER (SR) 150 MG PO TB12
150.0000 mg | ORAL_TABLET | Freq: Every day | ORAL | 0 refills | Status: DC
Start: 2020-12-11 — End: 2021-01-15

## 2020-12-11 NOTE — Progress Notes (Signed)
Virtual Visit via Video Note  I connected with Frances NurseErin E Lamb on 12/11/20 at  1:30 PM EST by a video enabled telemedicine application and verified that I am speaking with the correct person using two identifiers.  Location Provider Location : [facility] Patient Location : Home  Participants: Patient , Provider    I discussed the limitations of evaluation and management by telemedicine and the availability of in person appointments. The patient expressed understanding and agreed to proceed.     I discussed the assessment and treatment plan with the patient. The patient was provided an opportunity to ask questions and all were answered. The patient agreed with the plan and demonstrated an understanding of the instructions.   The patient was advised to call back or seek an in-person evaluation if the symptoms worsen or if the condition fails to improve as anticipated.   BH MD OP Progress Note  12/11/2020 1:54 PM Frances Nurserin E Lamb  MRN:  [id number]  Chief Complaint:  Chief Complaint    Follow-up     HPI: Frances Lamb is a 25 year old Caucasian female, employed, [place] in [place], has a history of bipolar disorder type II, GAD, social anxiety disorder, hypertension was evaluated by telemedicine today.  Patient today reports mood wise she is doing okay.  She denies any significant depression or manic episodes.  She reports sleep is overall okay.  Patient reports she continues to struggle with sexual dysfunction probably from the medication.  Patient reports a few days ago when she forgot to take her medications she was able to be intimate with her husband.  Patient continues to take the Wellbutrin and reports she has not noticed much difference from the Wellbutrin.  Patient denies any suicidality, homicidality or perceptual disturbances.  Patient reports she had interviewed for a new job and probably got it and looks forward to hearing back from them.  Patient denies any other concerns  today.  Visit Diagnosis:    ICD-10-CM   1. Bipolar disorder, in full remission, most recent episode mixed (HCC)  F31.78   2. GAD (generalized anxiety disorder)  F41.1 DULoxetine (CYMBALTA) 20 MG capsule  3. Social anxiety disorder  F40.10   4. Insomnia due to mental disorder  F51.05   5. Sexual dysfunction  R37 buPROPion (WELLBUTRIN SR) 150 MG 12 hr tablet   likely due to psychotropics    Past Psychiatric History: I have reviewed past psychiatric history from my progress note on 01/10/2020.  Past trials of medications like Lamictal Cymbalta, Latuda, hydroxyzine, Adderall, Vyvanse, Seroquel  Past Medical History:  Past Medical History:  Diagnosis Date  . ADHD (attention deficit hyperactivity disorder)   . Anxiety   . Bipolar 2 disorder (HCC)   . Depression   . Gestational diabetes   . Hypertension   . Migraine   . Miscarriage     Past Surgical History:  Procedure Laterality Date  . CESAREAN SECTION N/A 10/28/2019   Procedure: CESAREAN SECTION;  Surgeon: Nadara MustardHarris, Robert P, MD;  Location: [facility] ORS;  Service: Obstetrics;  Laterality: N/A;  . TONSILLECTOMY AND ADENOIDECTOMY      Family Psychiatric History: I have reviewed family psychiatric history from my progress note on 01/10/2020  Family History:  Family History  Problem Relation Age of Onset  . Diabetes Mother   . Bipolar disorder Mother   . Anxiety disorder Mother   . Depression Mother   . OCD Mother   . Diabetes Maternal Grandfather     Social History: Reviewed  social history from my progress note on 01/10/2020 Social History   Socioeconomic History  . Marital status: Married    Spouse name: shannon  . Number of children: 1  . Years of education: Not on file  . Highest education level: Associate degree: occupational, Scientist, product/process development, or vocational program  Occupational History  . Not on file  Tobacco Use  . Smoking status: Former Smoker    Types: Cigarettes    Quit date: 11/29/2012    Years since quitting: 8.0  .  Smokeless tobacco: Never Used  . Tobacco comment: current vaping weekly  Vaping Use  . Vaping Use: Every day  Substance and Sexual Activity  . Alcohol use: Yes    Alcohol/week: 4.0 standard drinks    Types: 2 Glasses of wine, 2 Standard drinks or equivalent per week  . Drug use: Never  . Sexual activity: Not Currently    Birth control/protection: Pill  Other Topics Concern  . Not on file  Social History Narrative  . Not on file   Social Determinants of Health   Financial Resource Strain: Not on file  Food Insecurity: Not on file  Transportation Needs: Not on file  Physical Activity: Not on file  Stress: Not on file  Social Connections: Not on file    Allergies: No Known Allergies  Metabolic Disorder Labs: Lab Results  Component Value Date   HGBA1C 5.8 (H) 10/27/2019   MPG 119.76 10/27/2019   No results found for: PROLACTIN No results found for: CHOL, TRIG, HDL, CHOLHDL, VLDL, LDLCALC No results found for: TSH  Therapeutic Level Labs: No results found for: LITHIUM No results found for: VALPROATE No components found for:  CBMZ  Current Medications: Current Outpatient Medications  Medication Sig Dispense Refill  . DULoxetine (CYMBALTA) 20 MG capsule Take 1 capsule (20 mg total) by mouth as directed. Start taking 1 cap daily for 2 weeks and then take every other day for 2 weeks and stop 30 capsule 0  . albuterol (PROVENTIL) (2.5 MG/3ML) 0.083% nebulizer solution Take 2.5 mg by nebulization every 6 (six) hours as needed.    Marland Kitchen albuterol (PROVENTIL) (2.5 MG/3ML) 0.083% nebulizer solution Inhale into the lungs.    Marland Kitchen albuterol (VENTOLIN HFA) 108 (90 Base) MCG/ACT inhaler SMARTSIG:2 Inhalation Via Inhaler Every 4 Hours PRN    . budesonide (PULMICORT) 1 MG/2ML nebulizer solution Take 1 mg by nebulization daily.    Marland Kitchen buPROPion (WELLBUTRIN SR) 150 MG 12 hr tablet Take 1 tablet (150 mg total) by mouth daily with breakfast. 90 tablet 0  . colchicine 0.6 MG tablet Take 0.6 mg by  mouth daily.    Marland Kitchen doxycycline (VIBRAMYCIN) 100 MG capsule Take 100 mg by mouth 2 (two) times daily.    Marland Kitchen HYDROcodone-acetaminophen (NORCO/VICODIN) 5-325 MG tablet Take 1 tablet by mouth every 4 (four) hours as needed for moderate pain. 20 tablet 0  . hydrOXYzine (ATARAX/VISTARIL) 50 MG tablet Take 1.5-2 tablets (75-100 mg total) by mouth at bedtime as needed (sleep). 180 tablet 0  . LamoTRIgine (LAMICTAL XR) 25 MG TB24 24 hour tablet Take 1 tablet (25 mg total) by mouth daily. To be taken with 250 mg tablet 90 tablet 0  . LamoTRIgine 250 MG TB24 24 hour tablet Take 250 mg by mouth daily. To be combined with 25 mg 90 tablet 0  . metoprolol succinate (TOPROL-XL) 25 MG 24 hr tablet TAKE 1 TABLET BY MOUTH DAILY 30 tablet 2  . naproxen (NAPROSYN) 500 MG tablet Take 1 tablet (500  mg total) by mouth 2 (two) times daily with a meal. 20 tablet 2  . norethindrone-ethinyl estradiol (AUROVELA FE 1/20) 1-20 MG-MCG tablet Take 1 tablet by mouth daily.    . norgestimate-ethinyl estradiol (ORTHO-CYCLEN) 0.25-35 MG-MCG tablet Take 1 tablet by mouth daily. 3 Package 3  . predniSONE (DELTASONE) 20 MG tablet Take by mouth.    . QUEtiapine (SEROQUEL) 100 MG tablet TAKE 1 AND 1/2 TABLETS(150 MG) BY MOUTH AT BEDTIME 135 tablet 0   No current facility-administered medications for this visit.     Musculoskeletal: Strength & Muscle Tone: UTA Gait & Station: normal Patient leans: N/A  Psychiatric Specialty Exam: Review of Systems  Endocrine:       Sexual dysfunction  Psychiatric/Behavioral: Negative for agitation, behavioral problems, confusion, decreased concentration, dysphoric mood, hallucinations, self-injury, sleep disturbance and suicidal ideas. The patient is not nervous/anxious and is not hyperactive.   All other systems reviewed and are negative.   not currently breastfeeding.There is no height or weight on file to calculate BMI.  General Appearance: Casual  Eye Contact:  Fair  Speech:  Clear and  Coherent  Volume:  Normal  Mood:  Euthymic  Affect:  Congruent  Thought Process:  Goal Directed and Descriptions of Associations: Intact  Orientation:  Full (Time, Place, and Person)  Thought Content: Logical   Suicidal Thoughts:  No  Homicidal Thoughts:  No  Memory:  Immediate;   Fair Recent;   Fair Remote;   Fair  Judgement:  Fair  Insight:  Fair  Psychomotor Activity:  Normal  Concentration:  Concentration: Fair and Attention Span: Fair  Recall:  [employer] of Knowledge: Fair  Language: Fair  Akathisia:  No  Handed:  Right  AIMS (if indicated): UTA  Assets:  Communication Skills Desire for Improvement Housing Social Support  ADL's:  Intact  Cognition: WNL  Sleep:  Fair   Screenings: PHQ2-9   Flowsheet Row Nutrition from 10/10/2019 in [facility] [place]  PHQ-2 Total Score 2  PHQ-9 Total Score 11       Assessment and Plan: Frances Lamb is a 25 year old Caucasian female, married, employed, has a history of bipolar disorder, anxiety disorder, hypertension was evaluated by telemedicine today.  Patient is biologically predisposed given her family history.  Patient with psychosocial stressors of the pandemic, being a new mother.  Patient is currently struggling with sexual side effects of her medications.  Discussed plan as noted below.  Plan Bipolar disorder in remission Lamictal to 75 mg p.o. daily Seroquel 150 mg manage nightly  GAD-stable We will continue to taper off Cymbalta.  Social anxiety disorder-stable We will monitor closely. Patient has started psychotherapy session with Ms. Timoteo [facility] in [place].  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 daily Taper of Cymbalta.  We will start Cymbalta 20 mg p.o. daily for the next 2 weeks, and 20 mg every other day for 2 weeks and stop taking it. Continue CBT  Follow-up in clinic in 6 weeks or sooner  if needed.  I have spent atleast 20 minutes face to face by video with patient today. More than 50 % of the time was spent for preparing to see the patient ( e.g., review of test, records ),  ordering medications and test ,psychoeducation and supportive psychotherapy and care coordination,as well as documenting clinical information in electronic health record. This note was generated in part or whole with voice recognition software.  Voice recognition is usually quite accurate but there are transcription errors that can and very often do occur. I apologize for any typographical errors that were not detected and corrected.       Jomarie Longs, MD 12/12/2020, 8:13 AM

## 2021-01-15 ENCOUNTER — Encounter: Payer: Self-pay | Admitting: Psychiatry

## 2021-01-15 ENCOUNTER — Other Ambulatory Visit: Payer: Self-pay

## 2021-01-15 ENCOUNTER — Telehealth (INDEPENDENT_AMBULATORY_CARE_PROVIDER_SITE_OTHER): Payer: 59 | Admitting: Psychiatry

## 2021-01-15 DIAGNOSIS — F4010 Social phobia, unspecified: Secondary | ICD-10-CM

## 2021-01-15 DIAGNOSIS — F5105 Insomnia due to other mental disorder: Secondary | ICD-10-CM | POA: Diagnosis not present

## 2021-01-15 DIAGNOSIS — R37 Sexual dysfunction, unspecified: Secondary | ICD-10-CM

## 2021-01-15 DIAGNOSIS — F411 Generalized anxiety disorder: Secondary | ICD-10-CM | POA: Diagnosis not present

## 2021-01-15 DIAGNOSIS — F3178 Bipolar disorder, in full remission, most recent episode mixed: Secondary | ICD-10-CM | POA: Diagnosis not present

## 2021-01-15 MED ORDER — LAMOTRIGINE ER 250 MG PO TB24: 250.0000 mg | ORAL_TABLET | Freq: Every day | ORAL | 0 refills | Status: DC

## 2021-01-15 MED ORDER — LAMOTRIGINE ER 25 MG PO TB24: 25.0000 mg | ORAL_TABLET | Freq: Every day | ORAL | 0 refills | Status: DC

## 2021-01-15 MED ORDER — QUETIAPINE FUMARATE 100 MG PO TABS
ORAL_TABLET | ORAL | 0 refills | Status: DC
Start: 2021-01-15 — End: 2021-03-23

## 2021-01-15 MED ORDER — BUPROPION HCL ER (SR) 150 MG PO TB12: 150.0000 mg | ORAL_TABLET | Freq: Every day | ORAL | 0 refills | Status: DC

## 2021-01-15 NOTE — Progress Notes (Signed)
Virtual Visit via Video Note  I connected with Frances Lamb on 01/15/21 at  4:00 PM EST by a video enabled telemedicine application and verified that I am speaking with the correct person using two identifiers.  Location Provider Location : [facility] Patient Location : Home  Participants: Patient , Provider    I discussed the limitations of evaluation and management by telemedicine and the availability of in person appointments. The patient expressed understanding and agreed to proceed.   I discussed the assessment and treatment plan with the patient. The patient was provided an opportunity to ask questions and all were answered. The patient agreed with the plan and demonstrated an understanding of the instructions.   The patient was advised to call back or seek an in-person evaluation if the symptoms worsen or if the condition fails to improve as anticipated.  BH MD OP Progress Note  01/15/2021 5:19 PM Frances Lamb  MRN:  [id number]  Chief Complaint:  Chief Complaint    Follow-up     HPI: Frances Lamb is a 25 year old Caucasian female, employed, lives in [place], has a history of bipolar disorder type II, GAD, social anxiety disorder, hypertension was evaluated by telemedicine today.  Patient today reports she continues to have the psychosocial stressors of job related problems.  She is currently waiting for a new job opportunity, has interviewed already.  Patient reports she had an evaluation at her current job recently and that did not come back positive.  She reports she was told she may be asked to start a dayshift and she is not worried about that.  Patient reports overall her mood as okay.  She is compliant on the medications as prescribed.  She was able to taper off of the Cymbalta well.  Patient denies any suicidality, homicidality or perceptual disturbances.  She does report a headache today since her baby is teething and has been crying a lot.  Patient however reports she  does have a history of migraine headaches and this current headache is not that bad.  She does have Motrin available as needed.  Patient with recent lab abnormalities, TSH elevated as well as hemoglobin A1c elevated.  She has upcoming appointment with her providers for management.  She has been referred to a weight loss clinic.  She will also talk to her OB/GYN.  Patient continues to follow-up with psychotherapist, reports it is beneficial.  Patient denies any other concerns today.  Visit Diagnosis:    ICD-10-CM   1. Bipolar disorder, in full remission, most recent episode mixed (HCC)  F31.78   2. GAD (generalized anxiety disorder)  F41.1 LamoTRIgine 250 MG TB24 24 hour tablet    QUEtiapine (SEROQUEL) 100 MG tablet  3. Social anxiety disorder  F40.10 LamoTRIgine (LAMICTAL XR) 25 MG TB24 24 hour tablet  4. Insomnia due to mental disorder  F51.05   5. Sexual dysfunction  R37 buPROPion (WELLBUTRIN SR) 150 MG 12 hr tablet   likely due to psychotropics    Past Psychiatric History: I have reviewed past psychiatric history from my progress note on 01/10/2020.  Past trials of medications like Lamictal, Cymbalta, Latuda, hydroxyzine, Adderall, Vyvanse, Seroquel  Past Medical History:  Past Medical History:  Diagnosis Date   ADHD (attention deficit hyperactivity disorder)    Anxiety    Bipolar 2 disorder (HCC)    Depression    Gestational diabetes    Hypertension    Migraine    Miscarriage     Past Surgical History:  Procedure Laterality Date   CESAREAN SECTION N/A 10/28/2019   Procedure: CESAREAN SECTION;  Surgeon: Nadara Mustard, MD;  Location: [facility] ORS;  Service: Obstetrics;  Laterality: N/A;   TONSILLECTOMY AND ADENOIDECTOMY      Family Psychiatric History: I have reviewed family psychiatric history from my progress note on 01/10/2020  Family History:  Family History  Problem Relation Age of Onset   Diabetes Mother    Bipolar disorder Mother    Anxiety disorder  Mother    Depression Mother    OCD Mother    Diabetes Maternal Grandfather     Social History: Reviewed social history from my progress note on 01/10/2020 Social History   Socioeconomic History   Marital status: Married    Spouse name: shannon   Number of children: 1   Years of education: Not on file   Highest education level: Associate degree: occupational, Scientist, product/process development, or vocational program  Occupational History   Not on file  Tobacco Use   Smoking status: Former Smoker    Types: Cigarettes    Quit date: 11/29/2012    Years since quitting: 8.1   Smokeless tobacco: Never Used   Tobacco comment: current vaping weekly  Vaping Use   Vaping Use: Every day  Substance and Sexual Activity   Alcohol use: Yes    Alcohol/week: 4.0 standard drinks    Types: 2 Glasses of wine, 2 Standard drinks or equivalent per week   Drug use: Never   Sexual activity: Not Currently    Birth control/protection: Pill  Other Topics Concern   Not on file  Social History Narrative   Not on file   Social Determinants of Health   Financial Resource Strain: Not on file  Food Insecurity: Not on file  Transportation Needs: Not on file  Physical Activity: Not on file  Stress: Not on file  Social Connections: Not on file    Allergies: No Known Allergies  Metabolic Disorder Labs: Lab Results  Component Value Date   HGBA1C 5.8 (H) 10/27/2019   MPG 119.76 10/27/2019   No results found for: PROLACTIN No results found for: CHOL, TRIG, HDL, CHOLHDL, VLDL, LDLCALC No results found for: TSH  Therapeutic Level Labs: No results found for: LITHIUM No results found for: VALPROATE No components found for:  CBMZ  Current Medications: Current Outpatient Medications  Medication Sig Dispense Refill   albuterol (PROVENTIL) (2.5 MG/3ML) 0.083% nebulizer solution Take 2.5 mg by nebulization every 6 (six) hours as needed.     albuterol (PROVENTIL) (2.5 MG/3ML) 0.083% nebulizer solution Inhale  into the lungs.     albuterol (VENTOLIN HFA) 108 (90 Base) MCG/ACT inhaler SMARTSIG:2 Inhalation Via Inhaler Every 4 Hours PRN     budesonide (PULMICORT) 1 MG/2ML nebulizer solution Take 1 mg by nebulization daily.     buPROPion (WELLBUTRIN SR) 150 MG 12 hr tablet Take 1 tablet (150 mg total) by mouth daily with breakfast. 90 tablet 0   colchicine 0.6 MG tablet Take 0.6 mg by mouth daily.     doxycycline (VIBRAMYCIN) 100 MG capsule Take 100 mg by mouth 2 (two) times daily.     HYDROcodone-acetaminophen (NORCO/VICODIN) 5-325 MG tablet Take 1 tablet by mouth every 4 (four) hours as needed for moderate pain. 20 tablet 0   hydrOXYzine (ATARAX/VISTARIL) 50 MG tablet Take 1.5-2 tablets (75-100 mg total) by mouth at bedtime as needed (sleep). 180 tablet 0   LamoTRIgine (LAMICTAL XR) 25 MG TB24 24 hour tablet Take 1 tablet (25 mg total)  by mouth daily. To be taken with 250 mg tablet 90 tablet 0   LamoTRIgine 250 MG TB24 24 hour tablet Take 250 mg by mouth daily. To be combined with 25 mg 90 tablet 0   metoprolol succinate (TOPROL-XL) 25 MG 24 hr tablet TAKE 1 TABLET BY MOUTH DAILY 30 tablet 2   naproxen (NAPROSYN) 500 MG tablet Take 1 tablet (500 mg total) by mouth 2 (two) times daily with a meal. 20 tablet 2   norethindrone-ethinyl estradiol (AUROVELA FE 1/20) 1-20 MG-MCG tablet Take 1 tablet by mouth daily.     norgestimate-ethinyl estradiol (ORTHO-CYCLEN) 0.25-35 MG-MCG tablet Take 1 tablet by mouth daily. 3 Package 3   predniSONE (DELTASONE) 20 MG tablet Take by mouth.     QUEtiapine (SEROQUEL) 100 MG tablet TAKE 1 AND 1/2 TABLETS(150 MG) BY MOUTH AT BEDTIME 135 tablet 0   No current facility-administered medications for this visit.     Musculoskeletal: Strength & Muscle Tone: UTA Gait & Station: UTA Patient leans: N/A  Psychiatric Specialty Exam: Review of Systems  Neurological: Positive for headaches.  Psychiatric/Behavioral: The patient is nervous/anxious.   All other  systems reviewed and are negative.   not currently breastfeeding.There is no height or weight on file to calculate BMI.  General Appearance: Casual  Eye Contact:  Fair  Speech:  Clear and Coherent  Volume:  Normal  Mood:  Anxious coping well  Affect:  Congruent  Thought Process:  Goal Directed and Descriptions of Associations: Intact  Orientation:  Full (Time, Place, and Person)  Thought Content: Logical   Suicidal Thoughts:  No  Homicidal Thoughts:  No  Memory:  Immediate;   Fair Recent;   Fair Remote;   Fair  Judgement:  Fair  Insight:  Fair  Psychomotor Activity:  Normal  Concentration:  Concentration: Fair and Attention Span: Fair  Recall:  [employer]Fair  Fund of Knowledge: Fair  Language: Fair  Akathisia:  No  Handed:  Right  AIMS (if indicated): UTA  Assets:  Communication Skills Desire for Improvement Housing Social Support  ADL's:  Intact  Cognition: WNL  Sleep:  Fair   Screenings: PHQ2-9   Flowsheet Row Video [facility] Nutrition [facility]10/[facility] [place]  PHQ-2 Total Score 2 2  PHQ-9 Total Score 5 11    Flowsheet Row Video Visit from 01/15/2021 in [facility]  C-SSRS RISK CATEGORY No Risk       Assessment and Plan: Frances Lamb is a 25 year old Caucasian female, married, employed, has a history of bipolar disorder, anxiety disorder, hypertension was evaluated by telemedicine today.  Patient is biologically predisposed given her family history.  Patient with psychosocial stressors of the pandemic, being a new mother.  Patient is currently struggling with job related stressors.  Patient continues to be in psychotherapy sessions which are beneficial.  Discussed plan as noted below.  Plan Bipolar disorder in remission Lamictal 275 mg p.o. daily Seroquel 150 mg p.o. nightly  GAD-stable Continue CBT with her therapist Ms. [employer]isty Humphreys [place].  Social anxiety disorder-stable Continue CBT  Insomnia-stable We will monitor closely  Patient with recent lab abnormalities-TSH elevated, hemoglobin A1c-elevated-she will continue to follow-up with her providers.  Patient with history of sexual dysfunction has upcoming appointment with OB/GYN. Thyroid Stimulating Hormone (TSH) 0.450-5.330 uIU/ml uIU/mL 8.065High    Hemoglobin A1C 4.2 - 5.6 % 5.9High      Follow-up in clinic in 2 months or  sooner if needed.  I have spent atleast 20 minutes face to face by video with patient today. More than 50 % of the time was spent for preparing to see the patient ( e.g., review of test, records ), ordering medications and test ,psychoeducation and supportive psychotherapy and care coordination,as well as documenting clinical information in electronic health record,interpreting and communication of test results This note was generated in part or whole with voice recognition software. Voice recognition is usually quite accurate but there are transcription errors that can and very often do occur. I apologize for any typographical errors that were not detected and corrected.        Jomarie Longs, MD 01/16/2021, 4:35 AM

## 2021-02-03 ENCOUNTER — Telehealth (INDEPENDENT_AMBULATORY_CARE_PROVIDER_SITE_OTHER): Payer: 59 | Admitting: Psychiatry

## 2021-02-03 ENCOUNTER — Encounter: Payer: Self-pay | Admitting: Psychiatry

## 2021-02-03 ENCOUNTER — Other Ambulatory Visit: Payer: Self-pay

## 2021-02-03 DIAGNOSIS — F4010 Social phobia, unspecified: Secondary | ICD-10-CM | POA: Diagnosis not present

## 2021-02-03 DIAGNOSIS — F5105 Insomnia due to other mental disorder: Secondary | ICD-10-CM | POA: Diagnosis not present

## 2021-02-03 DIAGNOSIS — F3178 Bipolar disorder, in full remission, most recent episode mixed: Secondary | ICD-10-CM | POA: Diagnosis not present

## 2021-02-03 DIAGNOSIS — F411 Generalized anxiety disorder: Secondary | ICD-10-CM

## 2021-02-03 DIAGNOSIS — R37 Sexual dysfunction, unspecified: Secondary | ICD-10-CM

## 2021-02-03 MED ORDER — BUPROPION HCL 75 MG PO TABS: 75.0000 mg | ORAL_TABLET | Freq: Every day | ORAL | 1 refills | Status: DC

## 2021-02-03 NOTE — Progress Notes (Signed)
Virtual Visit via Video Note  I connected with Frances Lamb on 02/03/21 at 11:40 AM EST by a video enabled telemedicine application and verified that I am speaking with the correct person using two identifiers.  Location Provider Location : [facility] Patient Location : Home  Participants: Patient , Provider   I discussed the limitations of evaluation and management by telemedicine and the availability of in person appointments. The patient expressed understanding and agreed to proceed.   I discussed the assessment and treatment plan with the patient. The patient was provided an opportunity to ask questions and all were answered. The patient agreed with the plan and demonstrated an understanding of the instructions.   The patient was advised to call back or seek an in-person evaluation if the symptoms worsen or if the condition fails to improve as anticipated.  BH MD OP Progress Note  02/03/2021 9:56 PM Frances Lamb  MRN:  [id number]  Chief Complaint:  Chief Complaint    Follow-up; Anxiety     HPI: Frances Lamb is a 25 year old Caucasian female, employed, lives in [place], has a history of bipolar disorder type II, GAD, social anxiety disorder, hypertension was evaluated by telemedicine today.  Patient today returns for a visit reporting worsening irritability.  She reports this may have been getting worse since the past few weeks.  She however reports her spouse reported that this may have been going on since the past several months.  She however reports it did not bother her much until now.  Patient does have multiple psychosocial stressors including taking care of her toddler, recent changes at her job, is about to start a new job, the pandemic and so on.  Patient however reports she is currently working with her therapist every 2 weeks or so.  Patient denies any hypomanic or manic symptoms other than irritability.  She also denies sleep problems.  Denies depression or crying  spells.  Patient is compliant on medications as prescribed.  Denies any suicidality, homicidality or perceptual disturbances.   Visit Diagnosis:    ICD-10-CM   1. Bipolar disorder, in full remission, most recent episode mixed (HCC)  F31.78 buPROPion (WELLBUTRIN) 75 MG tablet  2. GAD (generalized anxiety disorder)  F41.1   3. Social anxiety disorder  F40.10   4. Insomnia due to mental disorder  F51.05   5. Sexual dysfunction  R37     Past Psychiatric History: I have reviewed past psychiatric history from my progress note on 01/10/2020.  Past trials of medications like Lamictal, Cymbalta, Latuda, hydroxyzine, Adderall, Vyvanse, Seroquel  Past Medical History:  Past Medical History:  Diagnosis Date  . ADHD (attention deficit hyperactivity disorder)   . Anxiety   . Bipolar 2 disorder (HCC)   . Depression   . Gestational diabetes   . Hypertension   . Migraine   . Miscarriage     Past Surgical History:  Procedure Laterality Date  . CESAREAN SECTION N/A 10/28/2019   Procedure: CESAREAN SECTION;  Surgeon: Nadara Mustard, MD;  Location: [facility] OR[facility];  Service: Obstetrics;  Laterality: N/A;  . TONSILLECTOMY AND ADENOIDECTOMY      Family Psychiatric History: I have reviewed family psychiatric history from my progress note on 01/10/2020  Family History:  Family History  Problem Relation Age of Onset  . Diabetes Mother   . Bipolar disorder Mother   . Anxiety disorder Mother   . Depression Mother   . OCD Mother   . Diabetes Maternal Grandfather  Social History: Reviewed social history from my progress note on 01/10/2020 Social History   Socioeconomic History  . Marital status: Married    Spouse name: Frances Lamb  . Number of children: 1  . Years of education: Not on file  . Highest education level: Associate degree: occupational, Scientist, product/process development, or vocational program  Occupational History  . Not on file  Tobacco Use  . Smoking status: Former Smoker    Types: Cigarettes    Quit  date: 11/29/2012    Years since quitting: 8.1  . Smokeless tobacco: Never Used  . Tobacco comment: current vaping weekly  Vaping Use  . Vaping Use: Every day  Substance and Sexual Activity  . Alcohol use: Yes    Alcohol/week: 4.0 standard drinks    Types: 2 Glasses of wine, 2 Standard drinks or equivalent per week  . Drug use: Never  . Sexual activity: Not Currently    Birth control/protection: Pill  Other Topics Concern  . Not on file  Social History Narrative  . Not on file   Social Determinants of Health   Financial Resource Strain: Not on file  Food Insecurity: Not on file  Transportation Needs: Not on file  Physical Activity: Not on file  Stress: Not on file  Social Connections: Not on file    Allergies: No Known Allergies  Metabolic Disorder Labs: Lab Results  Component Value Date   HGBA1C 5.8 (H) 10/27/2019   MPG 119.76 10/27/2019   No results found for: PROLACTIN No results found for: CHOL, TRIG, HDL, CHOLHDL, VLDL, LDLCALC No results found for: TSH  Therapeutic Level Labs: No results found for: LITHIUM No results found for: VALPROATE No components found for:  CBMZ  Current Medications: Current Outpatient Medications  Medication Sig Dispense Refill  . buPROPion (WELLBUTRIN) 75 MG tablet Take 1 tablet (75 mg total) by mouth daily with breakfast. 30 tablet 1  . albuterol (PROVENTIL) (2.5 MG/3ML) 0.083% nebulizer solution Take 2.5 mg by nebulization every 6 (six) hours as needed.    Marland Kitchen albuterol (PROVENTIL) (2.5 MG/3ML) 0.083% nebulizer solution Inhale into the lungs.    Marland Kitchen albuterol (VENTOLIN HFA) 108 (90 Base) MCG/ACT inhaler SMARTSIG:2 Inhalation Via Inhaler Every 4 Hours PRN    . budesonide (PULMICORT) 1 MG/2ML nebulizer solution Take 1 mg by nebulization daily.    . colchicine 0.6 MG tablet Take 0.6 mg by mouth daily.    Marland Kitchen doxycycline (VIBRAMYCIN) 100 MG capsule Take 100 mg by mouth 2 (two) times daily.    Marland Kitchen HYDROcodone-acetaminophen (NORCO/VICODIN) 5-325  MG tablet Take 1 tablet by mouth every 4 (four) hours as needed for moderate pain. 20 tablet 0  . hydrOXYzine (ATARAX/VISTARIL) 50 MG tablet Take 1.5-2 tablets (75-100 mg total) by mouth at bedtime as needed (sleep). 180 tablet 0  . LamoTRIgine (LAMICTAL XR) 25 MG TB24 24 hour tablet Take 1 tablet (25 mg total) by mouth daily. To be taken with 250 mg tablet 90 tablet 0  . LamoTRIgine 250 MG TB24 24 hour tablet Take 250 mg by mouth daily. To be combined with 25 mg 90 tablet 0  . metoprolol succinate (TOPROL-XL) 25 MG 24 hr tablet TAKE 1 TABLET BY MOUTH DAILY 30 tablet 2  . naproxen (NAPROSYN) 500 MG tablet Take 1 tablet (500 mg total) by mouth 2 (two) times daily with a meal. 20 tablet 2  . norethindrone-ethinyl estradiol (AUROVELA FE 1/20) 1-20 MG-MCG tablet Take 1 tablet by mouth daily.    . norgestimate-ethinyl estradiol (ORTHO-CYCLEN) 0.25-35 MG-MCG  tablet Take 1 tablet by mouth daily. 3 Package 3  . predniSONE (DELTASONE) 20 MG tablet Take by mouth.    . QUEtiapine (SEROQUEL) 100 MG tablet TAKE 1 AND 1/2 TABLETS(150 MG) BY MOUTH AT BEDTIME 135 tablet 0   No current facility-administered medications for this visit.     Musculoskeletal: Strength & Muscle Tone: UTA Gait & Station: UTA Patient leans: N/A  Psychiatric Specialty Exam: Review of Systems  Psychiatric/Behavioral:       Irritable  All other systems reviewed and are negative.   not currently breastfeeding.There is no height or weight on file to calculate BMI.  General Appearance: Casual  Eye Contact:  Fair  Speech:  Clear and Coherent  Volume:  Normal  Mood:  Irritable  Affect:  Appropriate  Thought Process:  Goal Directed and Descriptions of Associations: Intact  Orientation:  Full (Time, Place, and Person)  Thought Content: Logical   Suicidal Thoughts:  No  Homicidal Thoughts:  No  Memory:  Immediate;   Fair Recent;   Fair Remote;   Fair  Judgement:  Fair  Insight:  Fair  Psychomotor Activity:  Normal   Concentration:  Concentration: Fair and Attention Span: Fair  Recall:  [employer] of Knowledge: Fair  Language: Fair  Akathisia:  No  Handed:  Right  AIMS (if indicated): UTA  Assets:  Communication Skills Desire for Improvement Housing Intimacy Social Support Talents/Skills Transportation Vocational/Educational  ADL's:  Intact  Cognition: WNL  Sleep:  Fair   Screenings: PHQ2-9   Flowsheet Row Video Visit from 01/15/2021 in [facility] Nutrition from 10/10/2019 in [facility] [place]  PHQ-2 Total Score 2 2  PHQ-9 Total Score 5 11    Flowsheet Row Video Visit from 01/15/2021 in [facility]  C-SSRS RISK CATEGORY No Risk       Assessment and Plan: Frances Lamb is a 25 year old Caucasian female, married, employed, has a history of bipolar disorder, anxiety disorder, hypertension was evaluated by telemedicine today.  Patient is biologically predisposed given her family history.  Patient with psychosocial stressors of the pandemic, being a new mother.  Patient also with recent changes with her job.  She is currently struggling with irritability, discussed plan as noted below.  Plan Bipolar disorder in remission Lamotrigine 275 mg p.o. daily Seroquel 150 mg p.o. daily  GAD-stable Continue CBT with therapist Ms. Misty Humphreys in [place]  Social anxiety disorder-stable Continue CBT  Insomnia-stable Continue sleep hygiene techniques Seroquel will also help  Sexual dysfunction likely due to psychotropics-improving However due to recent irritability as well as drug to drug interaction with medications like metoprolol, will reduce Wellbutrin to 75 mg p.o. daily.  We will continue to taper it off if the irritability does not get better. Also discussed adding another medication like Trintellix or Viibryd if her mood symptoms get worse.  She is worried about going back on Cymbalta.  Patient with elevated  TSH level-dated 01/06/2021-will benefit from getting repeat labs.  Follow-up in clinic in 3 weeks or sooner if needed.  This note was generated in part or whole with voice recognition software. Voice recognition is usually quite accurate but there are transcription errors that can and very often do occur. I apologize for any typographical errors that were not detected and corrected.      Jomarie Longs, MD 02/03/2021, 9:56 PM

## 2021-02-26 ENCOUNTER — Encounter: Payer: Self-pay | Admitting: Psychiatry

## 2021-02-26 ENCOUNTER — Telehealth (INDEPENDENT_AMBULATORY_CARE_PROVIDER_SITE_OTHER): Payer: 59 | Admitting: Psychiatry

## 2021-02-26 ENCOUNTER — Other Ambulatory Visit: Payer: Self-pay

## 2021-02-26 DIAGNOSIS — F3181 Bipolar II disorder: Secondary | ICD-10-CM

## 2021-02-26 DIAGNOSIS — F5105 Insomnia due to other mental disorder: Secondary | ICD-10-CM

## 2021-02-26 DIAGNOSIS — F411 Generalized anxiety disorder: Secondary | ICD-10-CM

## 2021-02-26 DIAGNOSIS — F4010 Social phobia, unspecified: Secondary | ICD-10-CM

## 2021-02-26 DIAGNOSIS — R37 Sexual dysfunction, unspecified: Secondary | ICD-10-CM

## 2021-02-26 MED ORDER — LAMOTRIGINE ER 300 MG PO TB24
300.0000 mg | ORAL_TABLET | Freq: Every day | ORAL | 0 refills | Status: DC
Start: 2021-02-26 — End: 2021-03-23

## 2021-02-26 MED ORDER — BUPROPION HCL 75 MG PO TABS: 75.0000 mg | ORAL_TABLET | ORAL | 1 refills | Status: DC

## 2021-02-26 NOTE — Progress Notes (Signed)
Virtual Visit via Video Note  I connected with Glade Nurse on 02/26/21 at  3:20 PM EDT by a video enabled telemedicine application and verified that I am speaking with the correct person using two identifiers.  Location Provider Location : [facility] Patient Location : Home  Participants: Patient , Provider    I discussed the limitations of evaluation and management by telemedicine and the availability of in person appointments. The patient expressed understanding and agreed to proceed.   I discussed the assessment and treatment plan with the patient. The patient was provided an opportunity to ask questions and all were answered. The patient agreed with the plan and demonstrated an understanding of the instructions.   The patient was advised to call back or seek an in-person evaluation if the symptoms worsen or if the condition fails to improve as anticipated.   BH MD OP Progress Note  02/26/2021 7:38 PM Frances Lamb  MRN:  [id number]  Chief Complaint:  Chief Complaint    Follow-up; Anxiety     HPI: Frances Lamb is a 25 year old Caucasian female, employed, lives in [place], has a history of bipolar disorder, type II, GAD, social anxiety disorder, hypertension was evaluated by telemedicine today.  Patient reports she is going to start her new job tomorrow.  She looks forward to that.  Patient however reports she continues to struggle with irritability.  In spite of going down on the Wellbutrin the irritability is still there.  She reports her mother has observed that she has been struggling with irritability since being on the Wellbutrin.  Patient however wonders whether she is also irritable lately due to her Seroquel and Lamictal not working for her anymore.  Patient reports sleep is overall okay.  Patient denies any suicidality, homicidality or perceptual disturbances.  Patient reports she was able to go to a weight loss clinic however did not feel it was a good fit for her.   Patient was prescribed phentermine however has not filled the prescription.  She is planning to go to another provider.  Patient reports she has a good support system.  Patient denies any other concerns today.  Visit Diagnosis:    ICD-10-CM   1. Bipolar 2 disorder (HCC)  F31.81 LamoTRIgine 300 MG TB24 24 hour tablet    buPROPion (WELLBUTRIN) 75 MG tablet   depressed, mixed features , mild  2. GAD (generalized anxiety disorder)  F41.1   3. Social anxiety disorder  F40.10   4. Insomnia due to mental disorder  F51.05   5. Sexual dysfunction  R37     Past Psychiatric History: I have reviewed past psychiatric history from my progress note on 01/10/2020.  Past trials of medications like Lamictal, Cymbalta, Latuda, hydroxyzine, Adderall, Vyvanse, Seroquel  Past Medical History:  Past Medical History:  Diagnosis Date  . ADHD (attention deficit hyperactivity disorder)   . Anxiety   . Bipolar 2 disorder (HCC)   . Depression   . Gestational diabetes   . Hypertension   . Migraine   . Miscarriage     Past Surgical History:  Procedure Laterality Date  . CESAREAN SECTION N/A 10/28/2019   Procedure: CESAREAN SECTION;  Surgeon: Nadara Mustard, MD;  Location: [facility] ORS;  Service: Obstetrics;  Laterality: N/A;  . TONSILLECTOMY AND ADENOIDECTOMY      Family Psychiatric History: I have reviewed family psychiatric history from my progress note on 01/10/2020  Family History:  Family History  Problem Relation Age of Onset  . Diabetes  Mother   . Bipolar disorder Mother   . Anxiety disorder Mother   . Depression Mother   . OCD Mother   . Diabetes Maternal Grandfather     Social History: I have reviewed social history from my progress note on 01/10/2020 Social History   Socioeconomic History  . Marital status: Married    Spouse name: shannon  . Number of children: 1  . Years of education: Not on file  . Highest education level: Associate degree: occupational, Scientist, product/process development, or vocational  program  Occupational History  . Not on file  Tobacco Use  . Smoking status: Former Smoker    Types: Cigarettes    Quit date: 11/29/2012    Years since quitting: 8.2  . Smokeless tobacco: Never Used  . Tobacco comment: current vaping weekly  Vaping Use  . Vaping Use: Every day  Substance and Sexual Activity  . Alcohol use: Yes    Alcohol/week: 4.0 standard drinks    Types: 2 Glasses of wine, 2 Standard drinks or equivalent per week  . Drug use: Never  . Sexual activity: Not Currently    Birth control/protection: Pill  Other Topics Concern  . Not on file  Social History Narrative  . Not on file   Social Determinants of Health   Financial Resource Strain: Not on file  Food Insecurity: Not on file  Transportation Needs: Not on file  Physical Activity: Not on file  Stress: Not on file  Social Connections: Not on file    Allergies: No Known Allergies  Metabolic Disorder Labs: Lab Results  Component Value Date   HGBA1C 5.8 (H) 10/27/2019   MPG 119.76 10/27/2019   No results found for: PROLACTIN No results found for: CHOL, TRIG, HDL, CHOLHDL, VLDL, LDLCALC No results found for: TSH  Therapeutic Level Labs: No results found for: LITHIUM No results found for: VALPROATE No components found for:  CBMZ  Current Medications: Current Outpatient Medications  Medication Sig Dispense Refill  . LamoTRIgine 300 MG TB24 24 hour tablet Take 1 tablet (300 mg total) by mouth daily. 90 tablet 0  . albuterol (PROVENTIL) (2.5 MG/3ML) 0.083% nebulizer solution Take 2.5 mg by nebulization every 6 (six) hours as needed.    Marland Kitchen albuterol (PROVENTIL) (2.5 MG/3ML) 0.083% nebulizer solution Inhale into the lungs.    Marland Kitchen albuterol (VENTOLIN HFA) 108 (90 Base) MCG/ACT inhaler SMARTSIG:2 Inhalation Via Inhaler Every 4 Hours PRN    . budesonide (PULMICORT) 1 MG/2ML nebulizer solution Take 1 mg by nebulization daily.    Marland Kitchen buPROPion (WELLBUTRIN) 75 MG tablet Take 1 tablet (75 mg total) by mouth as  directed. 30 tablet 1  . colchicine 0.6 MG tablet Take 0.6 mg by mouth daily.    Marland Kitchen doxycycline (VIBRAMYCIN) 100 MG capsule Take 100 mg by mouth 2 (two) times daily.    Marland Kitchen HYDROcodone-acetaminophen (NORCO/VICODIN) 5-325 MG tablet Take 1 tablet by mouth every 4 (four) hours as needed for moderate pain. 20 tablet 0  . hydrOXYzine (ATARAX/VISTARIL) 50 MG tablet Take 1.5-2 tablets (75-100 mg total) by mouth at bedtime as needed (sleep). 180 tablet 0  . metoprolol succinate (TOPROL-XL) 25 MG 24 hr tablet TAKE 1 TABLET BY MOUTH DAILY 30 tablet 2  . naproxen (NAPROSYN) 500 MG tablet Take 1 tablet (500 mg total) by mouth 2 (two) times daily with a meal. 20 tablet 2  . norethindrone-ethinyl estradiol (AUROVELA FE 1/20) 1-20 MG-MCG tablet Take 1 tablet by mouth daily.    . norgestimate-ethinyl estradiol (ORTHO-CYCLEN) 0.25-35  MG-MCG tablet Take 1 tablet by mouth daily. 3 Package 3  . QUEtiapine (SEROQUEL) 100 MG tablet TAKE 1 AND 1/2 TABLETS(150 MG) BY MOUTH AT BEDTIME 135 tablet 0   No current facility-administered medications for this visit.     Musculoskeletal: Strength & Muscle Tone: UTA Gait & Station: UTA Patient leans: N/A  Psychiatric Specialty Exam: Review of Systems  Psychiatric/Behavioral:       Irritable  All other systems reviewed and are negative.   not currently breastfeeding.There is no height or weight on file to calculate BMI.  General Appearance: Casual  Eye Contact:  Fair  Speech:  Clear and Coherent  Volume:  Normal  Mood:  Irritable  Affect:  Appropriate  Thought Process:  Goal Directed and Descriptions of Associations: Intact  Orientation:  Full (Time, Place, and Person)  Thought Content: Logical   Suicidal Thoughts:  No  Homicidal Thoughts:  No  Memory:  Immediate;   Fair Recent;   Fair Remote;   Fair  Judgement:  Fair  Insight:  Fair  Psychomotor Activity:  Normal  Concentration:  Concentration: Fair and Attention Span: Fair  Recall:  [employer] of  Knowledge: Fair  Language: Fair  Akathisia:  No  Handed:  Right  AIMS (if indicated): UTA  Assets:  Communication Skills Desire for Improvement Housing Social Support  ADL's:  Intact  Cognition: WNL  Sleep:  Fair   Screenings: PHQ2-9   Flowsheet Row Video [facility]15/20[facility] in [facility] Nutrition from 10/10/2019 in [facility] [place]  PHQ-2 Total Score 2 2  PHQ-9 Total Score 5 11    Flowsheet Row Video Visit from 01/15/2021 in [facility]  C-SSRS RISK CATEGORY No Risk       Assessment and Plan: Frances Lamb is a 25 year old Caucasian female, married, employed, has a history of bipolar disorder, anxiety disorder, hypertension was evaluated by telemedicine today.  Patient is biologically predisposed given her family history.  Patient with psychosocial stressors of pandemic, being a new mother.  Patient with recent job change, starting a new job tomorrow.  She continues to struggle with irritability likely due to Wellbutrin.  Discussed plan as noted below.  Plan Bipolar disorder type II, mild, mixed-unstable Increase lamotrigine to XR 300 mg p.o. daily Continue Seroquel 150 mg p.o. daily   GAD-stable Continue CBT with therapist [employer].  Social anxiety disorder-stable Continue CBT  Insomnia-stable Continue sleep hygiene techniques Seroquel will also help  Sexual dysfunction-likely due to psychotropics-improving Will taper off Wellbutrin due to side effects.  Advised patient to take Wellbutrin every other day for 10 days and stop taking it.  Follow-up in clinic in 3 to 4 weeks or sooner if needed.  Patient to call the clinic back once she gets her schedule at her new job tomorrow.  This note was generated in part or whole with voice recognition software. Voice recognition is usually quite accurate but there are transcription errors that can and very often do occur. I apologize for any  typographical errors that were not detected and corrected.        Jomarie Longs, MD 02/26/2021, 7:38 PM

## 2021-03-15 ENCOUNTER — Emergency Department: Payer: Self-pay

## 2021-03-15 ENCOUNTER — Emergency Department
Admission: EM | Admit: 2021-03-15 | Discharge: 2021-03-15 | Disposition: A | Discharge status: Home / Self Care | Payer: Self-pay | Attending: Emergency Medicine | Admitting: Emergency Medicine

## 2021-03-15 ENCOUNTER — Other Ambulatory Visit: Payer: Self-pay

## 2021-03-15 DIAGNOSIS — S93401A Sprain of unspecified ligament of right ankle, initial encounter: Secondary | ICD-10-CM | POA: Insufficient documentation

## 2021-03-15 DIAGNOSIS — X501XXA Overexertion from prolonged static or awkward postures, initial encounter: Secondary | ICD-10-CM | POA: Insufficient documentation

## 2021-03-15 DIAGNOSIS — Y92818 Other transport vehicle as the place of occurrence of the external cause: Secondary | ICD-10-CM | POA: Insufficient documentation

## 2021-03-15 DIAGNOSIS — Z79899 Other long term (current) drug therapy: Secondary | ICD-10-CM | POA: Insufficient documentation

## 2021-03-15 DIAGNOSIS — S8262XA Displaced fracture of lateral malleolus of left fibula, initial encounter for closed fracture: Secondary | ICD-10-CM | POA: Insufficient documentation

## 2021-03-15 DIAGNOSIS — Z87891 Personal history of nicotine dependence: Secondary | ICD-10-CM | POA: Insufficient documentation

## 2021-03-15 DIAGNOSIS — I10 Essential (primary) hypertension: Secondary | ICD-10-CM | POA: Insufficient documentation

## 2021-03-15 MED ORDER — HYDROCODONE-ACETAMINOPHEN 5-325 MG PO TABS
1.0000 | ORAL_TABLET | Freq: Once | ORAL | Status: AC
  Administered 2021-03-15: 1 via ORAL
  Filled 2021-03-15: qty 1

## 2021-03-15 MED ORDER — MELOXICAM 15 MG PO TABS: 15.0000 mg | ORAL_TABLET | Freq: Every day | ORAL | 2 refills | Status: DC

## 2021-03-15 MED ORDER — HYDROCODONE-ACETAMINOPHEN 5-325 MG PO TABS: 1.0000 | ORAL_TABLET | Freq: Four times a day (QID) | ORAL | 0 refills | Status: DC | PRN

## 2021-03-15 NOTE — ED Triage Notes (Signed)
Pt states she was stepping down from the jeep and rolled her left ankle today with pain and swelling noted.

## 2021-03-15 NOTE — Discharge Instructions (Signed)
Follow-up with Dr. Allena Katz at [facility].  Please call for appointment.  Return emergency department worsening.  Elevate and ice the ankle.  Take meloxicam daily for pain and inflammation.  Vicodin for pain not controlled by the meloxicam

## 2021-03-15 NOTE — ED Provider Notes (Signed)
[facility] Emergency Department Provider Note  ____________________________________________   Event Date/Time   First MD Initiated Contact with Patient 03/15/21 1402     (approximate)  I have reviewed the triage vital signs and the nursing notes.   HISTORY  Chief Complaint Ankle Pain    HPI Frances Lamb is a 25 y.o. female presents emergency department with left ankle injury.  Patient was in a jeep and stepped on and rolled her ankle.  Patient has history of several ankle sprains and avulsions.  She has swelling.  Has been unable to bear weight since this happened about an hour prior to arrival.  She denies any other injuries at this time.    Past Medical History:  Diagnosis Date  . ADHD (attention deficit hyperactivity disorder)   . Anxiety   . Bipolar 2 disorder (HCC)   . Depression   . Gestational diabetes   . Hypertension   . Migraine   . Miscarriage     Patient Active Problem List   Diagnosis Date Noted  . Bipolar disorder, in full remission, most recent episode mixed (HCC) 11/11/2020  . Sexual dysfunction 10/14/2020  . Bipolar disorder, in full remission, most recent episode depressed (HCC) 07/07/2020  . Insomnia due to mental disorder 03/04/2020  . High risk medication use 02/05/2020  . Bipolar 2 disorder, major depressive episode (HCC) 01/10/2020  . Social anxiety disorder 01/10/2020  . S/P cesarean section 11/14/2019  . Delivery by cesarean section using transverse incision of lower segment of uterus 10/28/2019  . Bipolar 2 disorder (HCC) 03/14/2019  . Obesity, Class III, BMI 40-49.9 (morbid obesity) (HCC) 03/14/2019  . GAD (generalized anxiety disorder) 04/18/2018    Past Surgical History:  Procedure Laterality Date  . CESAREAN SECTION N/A 10/28/2019   Procedure: CESAREAN SECTION;  Surgeon: Nadara Mustard, MD;  Location: [facility] ORS;  Service: Obstetrics;  Laterality: N/A;  . TONSILLECTOMY AND ADENOIDECTOMY      Prior to  Admission medications   Medication Sig Start Date End Date Taking? Authorizing Provider  albuterol (PROVENTIL) (2.5 MG/3ML) 0.083% nebulizer solution Take 2.5 mg by nebulization every 6 (six) hours as needed. 07/02/20   [provider]  albuterol (PROVENTIL) (2.5 MG/3ML) 0.083% nebulizer solution Inhale into the lungs. 07/02/20 07/02/21  [provider]  albuterol (VENTOLIN HFA) 108 (90 Base) MCG/ACT inhaler SMARTSIG:2 Inhalation Via Inhaler Every 4 Hours PRN 06/25/20   [provider]  budesonide (PULMICORT) 1 MG/2ML nebulizer solution Take 1 mg by nebulization daily. 07/03/20   [provider]  buPROPion (WELLBUTRIN) 75 MG tablet Take 1 tablet (75 mg total) by mouth as directed. 02/26/21   Jomarie Longs, MD  colchicine 0.6 MG tablet Take 0.6 mg by mouth daily. 06/25/20   [provider]  doxycycline (VIBRAMYCIN) 100 MG capsule Take 100 mg by mouth 2 (two) times daily. 06/25/20   [provider]  HYDROcodone-acetaminophen (NORCO/VICODIN) 5-325 MG tablet Take 1 tablet by mouth every 4 (four) hours as needed for moderate pain. 05/02/20   Jene Every, MD  hydrOXYzine (ATARAX/VISTARIL) 50 MG tablet Take 1.5-2 tablets (75-100 mg total) by mouth at bedtime as needed (sleep). 03/27/20   Jomarie Longs, MD  LamoTRIgine 300 MG TB24 24 hour tablet Take 1 tablet (300 mg total) by mouth daily. 02/26/21   Jomarie Longs, MD  metoprolol succinate (TOPROL-XL) 25 MG 24 hr tablet TAKE 1 TABLET BY MOUTH DAILY 11/05/19   Farrel Conners, CNM  naproxen (NAPROSYN) 500 MG tablet Take  1 tablet (500 mg total) by mouth 2 (two) times daily with a meal. 05/02/20   Jene Every, MD  norethindrone-ethinyl estradiol (AUROVELA FE 1/20) 1-20 MG-MCG tablet Take 1 tablet by mouth daily. 03/08/20   [provider]  norgestimate-ethinyl estradiol (ORTHO-CYCLEN) 0.25-35 MG-MCG tablet Take 1 tablet by mouth daily. 04/14/20   Nadara Mustard, MD  QUEtiapine (SEROQUEL) 100 MG tablet  TAKE 1 AND 1/2 TABLETS(150 MG) BY MOUTH AT BEDTIME 01/15/21   Jomarie Longs, MD    Allergies Patient has no known allergies.  Family History  Problem Relation Age of Onset  . Diabetes Mother   . Bipolar disorder Mother   . Anxiety disorder Mother   . Depression Mother   . OCD Mother   . Diabetes Maternal Grandfather     Social History Social History   Tobacco Use  . Smoking status: Former Smoker    Types: Cigarettes    Quit date: 11/29/2012    Years since quitting: 8.2  . Smokeless tobacco: Never Used  . Tobacco comment: current vaping weekly  Vaping Use  . Vaping Use: Every day  Substance Use Topics  . Alcohol use: Yes    Alcohol/week: 4.0 standard drinks    Types: 2 Glasses of wine, 2 Standard drinks or equivalent per week  . Drug use: Never    Review of Systems  Constitutional: No fever/chills Eyes: No visual changes. ENT: No sore throat. Respiratory: Denies cough Genitourinary: Negative for dysuria. Musculoskeletal: Negative for back pain.  Positive for left ankle pain Skin: Negative for rash. Psychiatric: no mood changes,     ____________________________________________   PHYSICAL EXAM:  VITAL SIGNS: ED Triage Vitals  Enc Vitals Group     BP 03/15/21 1237 (!) 137/97     Pulse Rate 03/15/21 1237 96     Resp 03/15/21 1237 19     Temp 03/15/21 1237 98.5 F (36.9 C)     Temp Source 03/15/21 1237 Oral     SpO2 03/15/21 1237 96 %     Weight 03/15/21 1243 253 lb (114.8 kg)     Height 03/15/21 1243 5\' 2"  (1.575 m)     Head Circumference --      Peak Flow --      Pain Score 03/15/21 1243 10     Pain Loc --      Pain Edu? --      Excl. in GC? --     Constitutional: Alert and oriented. Well appearing and in no acute distress. Eyes: Conjunctivae are normal.  Head: Atraumatic. Nose: No congestion/rhinnorhea. Mouth/Throat: Mucous membranes are moist.   Neck:  supple no lymphadenopathy noted Cardiovascular: Normal rate, regular rhythm.   Respiratory: Normal respiratory effort.  No retractions GU: deferred Musculoskeletal: Decreased range of motion of the left ankle secondary to swelling and discomfort, Achilles is intact, neurovascular is intact, tender at the lateral malleolus, left foot is not tender  neurologic:  Normal speech and language.  Skin:  Skin is warm, dry and intact. No rash noted. Psychiatric: Mood and affect are normal. Speech and behavior are normal.  ____________________________________________   LABS (all labs ordered are listed, but only abnormal results are displayed)  Labs Reviewed - No data to display ____________________________________________   ____________________________________________  RADIOLOGY  X-ray of the left ankle  ____________________________________________   PROCEDURES  Procedure(s) performed: Stirrup splint   Procedures    ____________________________________________   INITIAL IMPRESSION / ASSESSMENT AND PLAN / ED COURSE  Pertinent labs & imaging  results that were available during my care of the patient were reviewed by me and considered in my medical decision making (see chart for details).   Patient is a 25 year old female presents with left ankle injury.  See HPI.  Physical exam shows left ankle to be very swollen and tender.  X-ray of the left ankle shows a avulsion fracture of the lateral malleolus, I had concerns due to the mortise.  Consult with Dr. Allena Katz.  He states that the mortise is normal.  Advised me to place her in a Aircast or a walking boot and weightbearing as tolerated.  Follow-up in his office.  Patient be given Vicodin while here in the ED.  States she does feel better with this medication.  She is to follow-up with Dr. Allena Katz.  She is given prescription for meloxicam and Vicodin.  Follow-up with the ED if worsening.  She has crutches at home.  She is to use crutches to avoid weightbearing if painful.  She was discharged in stable  condition.     Frances Lamb was evaluated in Emergency Department on 03/15/2021 for the symptoms described in the history of present illness. She was evaluated in the context of the global COVID-19 pandemic, which necessitated consideration that the patient might be at risk for infection with the SARS-CoV-2 virus that causes COVID-19. Institutional protocols and algorithms that pertain to the evaluation of patients at risk for COVID-19 are in a state of rapid change based on information released by regulatory bodies including the CDC and federal and state organizations. These policies and algorithms were followed during the patient's care in the ED.    As part of my medical decision making, I reviewed the following data within the electronic MEDICAL RECORD NUMBER Nursing notes reviewed and incorporated, Old chart reviewed, Radiograph reviewed , Notes from prior ED visits and [place] Controlled Substance Database  ____________________________________________   FINAL CLINICAL IMPRESSION(S) / ED DIAGNOSES  Final diagnoses:  Avulsion fracture of distal end of fibula  Sprain of right ankle, unspecified ligament, initial encounter      NEW MEDICATIONS STARTED DURING THIS VISIT:  New Prescriptions   No medications on file     Note:  This document was prepared using Dragon voice recognition software and may include unintentional dictation errors.    Faythe Ghee, PA-C 03/15/21 1533    Gilles Chiquito, MD 03/15/21 [phone]

## 2021-03-15 NOTE — ED Notes (Addendum)
See triage note  Presents with injury to left ankle  States she stepped down from jeep and rolled her ankle  Positive swelling   Good pulses   Unable to bear wt

## 2021-03-18 ENCOUNTER — Telehealth (INDEPENDENT_AMBULATORY_CARE_PROVIDER_SITE_OTHER): Payer: 59 | Admitting: Psychiatry

## 2021-03-18 ENCOUNTER — Other Ambulatory Visit: Payer: Self-pay

## 2021-03-18 DIAGNOSIS — Z5329 Procedure and treatment not carried out because of patient's decision for other reasons: Secondary | ICD-10-CM

## 2021-03-18 NOTE — Progress Notes (Signed)
No response to call or text or video invite  

## 2021-03-23 ENCOUNTER — Telehealth: Payer: Self-pay

## 2021-03-23 DIAGNOSIS — F3181 Bipolar II disorder: Secondary | ICD-10-CM

## 2021-03-23 DIAGNOSIS — F411 Generalized anxiety disorder: Secondary | ICD-10-CM

## 2021-03-23 MED ORDER — LAMOTRIGINE ER 250 MG PO TB24: 250.0000 mg | ORAL_TABLET | Freq: Every day | ORAL | 0 refills | Status: DC

## 2021-03-23 MED ORDER — QUETIAPINE FUMARATE 200 MG PO TABS: 200.0000 mg | ORAL_TABLET | Freq: Every day | ORAL | 0 refills | Status: DC

## 2021-03-23 NOTE — Telephone Encounter (Signed)
pt called states her mood swings are worse and her anxiety is also bad. pt wanted to speak with you about her mood.

## 2021-03-23 NOTE — Telephone Encounter (Signed)
Spoke to patient.  She apologized for missing her appointment. She reports she is feeling anxious and depressed and more irritable and does not believe Lamictal is helpful.  She also does not believe Cymbalta was the right medication since it gave her side effects and she does not want to go back on it. She is unable to keep up with her therapy appointments due to scheduling problems and inability to afford. Provided education, discussed with her to reach out to therapist and that she needs more psychotherapy visits.  If she is unable to do so advised her to reach out to [place] and we can give her resources in the community that she could afford.  Discussed the Seroquel dosage can be increased to 200 mg at bedtime and the Lamictal dosage can be reduced to 250 mg daily. Patient agrees with plan.  Advised patient to call the clinic back for a sooner appointment if she is not doing well in spite of making these changes.

## 2021-04-02 ENCOUNTER — Other Ambulatory Visit: Payer: Self-pay | Admitting: Podiatry

## 2021-04-02 DIAGNOSIS — M25372 Other instability, left ankle: Secondary | ICD-10-CM

## 2021-04-02 DIAGNOSIS — S93499A Sprain of other ligament of unspecified ankle, initial encounter: Secondary | ICD-10-CM

## 2021-04-02 DIAGNOSIS — S93402D Sprain of unspecified ligament of left ankle, subsequent encounter: Secondary | ICD-10-CM

## 2021-04-02 DIAGNOSIS — R262 Difficulty in walking, not elsewhere classified: Secondary | ICD-10-CM

## 2021-04-02 DIAGNOSIS — M25572 Pain in left ankle and joints of left foot: Secondary | ICD-10-CM

## 2021-04-03 ENCOUNTER — Telehealth: Payer: Self-pay | Admitting: Psychiatry

## 2021-04-03 NOTE — Telephone Encounter (Signed)
Attempted to contact patient to discuss that Lamictal extended release 250 mg was denied by her health insurance plan.  Left voicemail to contact us back

## 2021-04-06 ENCOUNTER — Encounter: Payer: Self-pay | Admitting: Psychiatry

## 2021-04-06 ENCOUNTER — Other Ambulatory Visit: Payer: Self-pay

## 2021-04-06 ENCOUNTER — Telehealth (INDEPENDENT_AMBULATORY_CARE_PROVIDER_SITE_OTHER): Payer: Self-pay | Admitting: Psychiatry

## 2021-04-06 ENCOUNTER — Telehealth: Payer: Self-pay

## 2021-04-06 DIAGNOSIS — F3181 Bipolar II disorder: Secondary | ICD-10-CM

## 2021-04-06 DIAGNOSIS — F411 Generalized anxiety disorder: Secondary | ICD-10-CM

## 2021-04-06 DIAGNOSIS — F4010 Social phobia, unspecified: Secondary | ICD-10-CM

## 2021-04-06 DIAGNOSIS — F5105 Insomnia due to other mental disorder: Secondary | ICD-10-CM

## 2021-04-06 DIAGNOSIS — Z79899 Other long term (current) drug therapy: Secondary | ICD-10-CM

## 2021-04-06 MED ORDER — DIVALPROEX SODIUM 250 MG PO DR TAB
250.0000 mg | DELAYED_RELEASE_TABLET | Freq: Two times a day (BID) | ORAL | 1 refills | Status: DC
Start: 2021-04-06 — End: 2021-05-06

## 2021-04-06 NOTE — Progress Notes (Signed)
Virtual Visit via Video Note  I connected with Frances NurseErin E Knopf on 04/06/21 at 10:40 AM EDT by a video enabled telemedicine application and verified that I am speaking with the correct person using two identifiers.  Location Provider Location : [facility] Patient Location : Home  Participants: Patient , Provider    I discussed the limitations of evaluation and management by telemedicine and the availability of in person appointments. The patient expressed understanding and agreed to proceed.    I discussed the assessment and treatment plan with the patient. The patient was provided an opportunity to ask questions and all were answered. The patient agreed with the plan and demonstrated an understanding of the instructions.   The patient was advised to call back or seek an in-person evaluation if the symptoms worsen or if the condition fails to improve as anticipated.   BH MD OP Progress Note  04/06/2021 12:50 PM Frances Lamb  MRN:  [id number]  Chief Complaint:  Chief Complaint    Follow-up; Anxiety     HPI: Frances Lamb is a 25 year old Caucasian female, employed, lives in [place], has a history of bipolar disorder type II, GAD, social anxiety disorder, hypertension was evaluated by telemedicine today.  Patient today reports she is currently at her new job.  She reports she believes she has enough support system at home to take care of her baby and also chores around the house.  She reports however she has noticed her irritability is getting worse.  She reports that simple things can make her irritable like her baby throwing a fuss when she tries to feed him, seeing dishes that needs to be washed left in the sink and so on.  She does not believe the higher dosage of Seroquel and lowering the dosage of Lamictal has made any difference with her irritability.  She reports the Seroquel dosage increase has helped her to sleep better.  She has been sleeping okay and that also has not improved her mood  swings or irritability.  Patient denies any suicidality, homicidality or perceptual disturbances.  She denies any side effects to Seroquel.  Patient reports she has been compliant with therapy as well as medications.  She denies any other concerns today.  Visit Diagnosis:    ICD-10-CM   1. Bipolar 2 disorder (HCC)  F31.81 divalproex (DEPAKOTE) 250 MG DR tablet   depressed, mild, mixed feaures  2. GAD (generalized anxiety disorder)  F41.1   3. Social anxiety disorder  F40.10   4. Insomnia due to mental disorder  F51.05   5. High risk medication use  Z79.899 CMP and Liver    CBC With Diff/Platelet    Valproic acid level    Past Psychiatric History: I have reviewed past psychiatric history from progress note on 01/10/2020.  Past trials of medications like Lamictal, Cymbalta, Latuda, hydroxyzine, Adderall, Vyvanse, Seroquel  Past Medical History:  Past Medical History:  Diagnosis Date  . ADHD (attention deficit hyperactivity disorder)   . Anxiety   . Bipolar 2 disorder (HCC)   . Depression   . Gestational diabetes   . Hypertension   . Migraine   . Miscarriage     Past Surgical History:  Procedure Laterality Date  . CESAREAN SECTION N/A 10/28/2019   Procedure: CESAREAN SECTION;  Surgeon: Nadara MustardHarris, Robert P, MD;  Location: [facility] ORS;  Service: Obstetrics;  Laterality: N/A;  . TONSILLECTOMY AND ADENOIDECTOMY      Family Psychiatric History: I have reviewed family psychiatric history from  progress note on 01/10/2020   Family History:  Family History  Problem Relation Age of Onset  . Diabetes Mother   . Bipolar disorder Mother   . Anxiety disorder Mother   . Depression Mother   . OCD Mother   . Diabetes Maternal Grandfather     Social History: Reviewed social history from progress note on 01/10/2020 Social History   Socioeconomic History  . Marital status: Married    Spouse name: shannon  . Number of children: 1  . Years of education: Not on file  . Highest education  level: Associate degree: occupational, Scientist, product/process development, or vocational program  Occupational History  . Not on file  Tobacco Use  . Smoking status: Former Smoker    Types: Cigarettes    Quit date: 11/29/2012    Years since quitting: 8.3  . Smokeless tobacco: Never Used  . Tobacco comment: current vaping weekly  Vaping Use  . Vaping Use: Every day  Substance and Sexual Activity  . Alcohol use: Yes    Alcohol/week: 4.0 standard drinks    Types: 2 Glasses of wine, 2 Standard drinks or equivalent per week  . Drug use: Never  . Sexual activity: Not Currently    Birth control/protection: Pill  Other Topics Concern  . Not on file  Social History Narrative  . Not on file   Social Determinants of Health   Financial Resource Strain: Not on file  Food Insecurity: Not on file  Transportation Needs: Not on file  Physical Activity: Not on file  Stress: Not on file  Social Connections: Not on file    Allergies: No Known Allergies  Metabolic Disorder Labs: Lab Results  Component Value Date   HGBA1C 5.8 (H) 10/27/2019   MPG 119.76 10/27/2019   No results found for: PROLACTIN No results found for: CHOL, TRIG, HDL, CHOLHDL, VLDL, LDLCALC No results found for: TSH  Therapeutic Level Labs: No results found for: LITHIUM No results found for: VALPROATE No components found for:  CBMZ  Current Medications: Current Outpatient Medications  Medication Sig Dispense Refill  . divalproex (DEPAKOTE) 250 MG DR tablet Take 1 tablet (250 mg total) by mouth 2 (two) times daily. 60 tablet 1  . albuterol (PROVENTIL) (2.5 MG/3ML) 0.083% nebulizer solution Take 2.5 mg by nebulization every 6 (six) hours as needed.    Marland Kitchen albuterol (PROVENTIL) (2.5 MG/3ML) 0.083% nebulizer solution Inhale into the lungs.    Marland Kitchen albuterol (VENTOLIN HFA) 108 (90 Base) MCG/ACT inhaler SMARTSIG:2 Inhalation Via Inhaler Every 4 Hours PRN    . budesonide (PULMICORT) 1 MG/2ML nebulizer solution Take 1 mg by nebulization daily.    .  colchicine 0.6 MG tablet Take 0.6 mg by mouth daily.    Marland Kitchen doxycycline (VIBRAMYCIN) 100 MG capsule Take 100 mg by mouth 2 (two) times daily.    Marland Kitchen HYDROcodone-acetaminophen (NORCO/VICODIN) 5-325 MG tablet Take 1 tablet by mouth every 6 (six) hours as needed for moderate pain. 15 tablet 0  . hydrOXYzine (ATARAX/VISTARIL) 50 MG tablet Take 1.5-2 tablets (75-100 mg total) by mouth at bedtime as needed (sleep). 180 tablet 0  . meloxicam (MOBIC) 15 MG tablet Take 1 tablet (15 mg total) by mouth daily. 30 tablet 2  . metoprolol succinate (TOPROL-XL) 25 MG 24 hr tablet TAKE 1 TABLET BY MOUTH DAILY 30 tablet 2  . norethindrone-ethinyl estradiol (AUROVELA FE 1/20) 1-20 MG-MCG tablet Take 1 tablet by mouth daily.    . norgestimate-ethinyl estradiol (ORTHO-CYCLEN) 0.25-35 MG-MCG tablet Take 1 tablet by mouth  daily. 3 Package 3  . QUEtiapine (SEROQUEL) 200 MG tablet Take 1 tablet (200 mg total) by mouth at bedtime. 30 tablet 0   No current facility-administered medications for this visit.     Musculoskeletal: Strength & Muscle Tone: UTA Gait & Station: normal Patient leans: N/A  Psychiatric Specialty Exam: Review of Systems  Psychiatric/Behavioral:       Irritable  All other systems reviewed and are negative.   not currently breastfeeding.There is no height or weight on file to calculate BMI.  General Appearance: Casual  Eye Contact:  Fair  Speech:  Clear and Coherent  Volume:  Normal  Mood:  Irritable  Affect:  Congruent  Thought Process:  Goal Directed and Descriptions of Associations: Intact  Orientation:  Full (Time, Place, and Person)  Thought Content: Logical   Suicidal Thoughts:  No  Homicidal Thoughts:  No  Memory:  Immediate;   Fair Recent;   Fair Remote;   Fair  Judgement:  Fair  Insight:  Fair  Psychomotor Activity:  Normal  Concentration:  Concentration: Fair and Attention Span: Fair  Recall:  [employer] of Knowledge: Fair  Language: Fair  Akathisia:  No  Handed:  Right   AIMS (if indicated): UTA  Assets:  Communication Skills Desire for Improvement Housing Social Support  ADL's:  Intact  Cognition: WNL  Sleep:  improving   Screenings: PHQ2-9   Flowsheet Row Video Visit from [facility]15/2021 in [facility] Nutrition from 10/10/2019 in [facility] [place]  PHQ-2 Total Score 2 2  PHQ-9 Total Score 5 11    Flowsheet Row ED from 03/15/2021 in [facility] Video Visit from 01/15/2021 in [facility]  C-SSRS RISK CATEGORY No Risk No Risk       Assessment and Plan: KINDAL PONTI is a 25 year old Caucasian female, married, employed, has a history of bipolar disorder, anxiety disorder, hypertension was evaluated by telemedicine today.  Patient is biologically predisposed given family history.  Patient with psychosocial stressors of pandemic, being a new mother.  Patient with recent job changes, taking care of baby, recent medication changes continues to struggle with irritability in spite of multiple medication readjustment.  She will benefit from the following plan.  Plan Bipolar disorder type II mild, mixed-unstable Taper off Lamictal.  Patient has Lamictal 25 mg from a previous prescription.  Advised patient to take it daily for the next 4 days and stop taking it. Start Depakote ER 250 mg p.o. twice daily for mood lability, irritability. Provided medication education including pregnancy/reproductive age implications, drug to drug interaction with her birth control, acne, hair loss, weight gain, effect on hepatic function, effect on platelet as well as sodium level and the need to monitor her Depakote levels closely. Continue Seroquel 200 mg p.o. daily   GAD- stable Continue CBT with therapist Ms. [employer].   Social anxiety disorder-stable Continue CBT  Insomnia-improving Seroquel helps with sleep. Monitor closely  High risk medication use-  will order Depakote level, CMP, CBC with differential.  She will go to [facility] 4 days after starting the new medication-Depakote.  Advised patient to go in the morning before she takes the morning dose.  Provided medication education  Discussed with patient to sign a release so writer can coordinate care with her therapist.  Follow-up in clinic in 2 weeks or sooner if needed.  This note was generated in part or whole with voice recognition software. Voice recognition is usually quite  accurate but there are transcription errors that can and very often do occur. I apologize for any typographical errors that were not detected and corrected.        Jomarie Longs, MD 04/06/2021, 12:50 PM

## 2021-04-06 NOTE — Telephone Encounter (Signed)
labwork orders were faxed and confirmed to [facility] for a cmp and liver, cbc with diff, valproic acid level. dx: z79.899

## 2021-04-06 NOTE — Patient Instructions (Signed)
Valproic Acid, Divalproex Sodium delayed or extended-release tablets What is this medicine? DIVALPROEX SODIUM (dye VAL pro ex SO dee um) is used to prevent seizures caused by some forms of epilepsy. It is also used to treat bipolar mania and to prevent migraine headaches. This medicine may be used for other purposes; ask your health care provider or pharmacist if you have questions. COMMON BRAND NAME(S): Depakote, Depakote ER What should I tell my health care provider before I take this medicine? They need to know if you have any of these conditions:  if you often drink alcohol  kidney disease  liver disease  low platelet counts  mitochondrial disease  suicidal thoughts, plans, or attempt; a previous suicide attempt by you or a family member  urea cycle disorder (UCD)  an unusual or allergic reaction to divalproex sodium, sodium valproate, valproic acid, other medicines, foods, dyes, or preservatives  pregnant or trying to get pregnant  breast-feeding How should I use this medicine? Take this medicine by mouth with a drink of water. Follow the directions on the prescription label. Do not cut, crush or chew this medicine. You can take it with or without food. If it upsets your stomach, take it with food. Take your medicine at regular intervals. Do not take it more often than directed. Do not stop taking except on your doctor's advice. A special MedGuide will be given to you by the pharmacist with each prescription and refill. Be sure to read this information carefully each time. Talk to your pediatrician regarding the use of this medicine in children. While this drug may be prescribed for children as young as 10 years for selected conditions, precautions do apply. Overdosage: If you think you have taken too much of this medicine contact a poison control center or emergency room at once. NOTE: This medicine is only for you. Do not share this medicine with others. What if I miss a  dose? If you miss a dose, take it as soon as you can. If it is almost time for your next dose, take only that dose. Do not take double or extra doses. What may interact with this medicine? Do not take this medicine with any of the following medications:  sodium phenylbutyrate This medicine may also interact with the following medications:  aspirin  certain antibiotics like ertapenem, imipenem, meropenem  certain medicines for depression, anxiety, or psychotic disturbances  certain medicines for seizures like carbamazepine, clonazepam, diazepam, ethosuximide, felbamate, lamotrigine, phenobarbital, phenytoin, primidone, rufinamide, topiramate  certain medicines that treat or prevent blood clots like warfarin  cholestyramine  female hormones, like estrogens and birth control pills, patches, or rings  propofol  rifampin  ritonavir  tolbutamide  zidovudine This list may not describe all possible interactions. Give your health care provider a list of all the medicines, herbs, non-prescription drugs, or dietary supplements you use. Also tell them if you smoke, drink alcohol, or use illegal drugs. Some items may interact with your medicine. What should I watch for while using this medicine? Tell your doctor or health care provider if your symptoms do not get better or they start to get worse. This medicine may cause serious skin reactions. They can happen weeks to months after starting the medicine. Contact your health care provider right away if you notice fevers or flu-like symptoms with a rash. The rash may be red or purple and then turn into blisters or peeling of the skin. Or, you might notice a red rash with swelling of the   face, lips or lymph nodes in your neck or under your arms. Wear a medical ID bracelet or chain, and carry a card that describes your disease and details of your medicine and dosage times. You may get drowsy, dizzy, or have blurred vision. Do not drive, use  machinery, or do anything that needs mental alertness until you know how this medicine affects you. To reduce dizzy or fainting spells, do not sit or stand up quickly, especially if you are an older patient. Alcohol can increase drowsiness and dizziness. Avoid alcoholic drinks. This medicine can make you more sensitive to the sun. Keep out of the sun. If you cannot avoid being in the sun, wear protective clothing and use sunscreen. Do not use sun lamps or tanning beds/booths. Patients and their families should watch out for new or worsening depression or thoughts of suicide. Also watch out for sudden changes in feelings such as feeling anxious, agitated, panicky, irritable, hostile, aggressive, impulsive, severely restless, overly excited and hyperactive, or not being able to sleep. If this happens, especially at the beginning of treatment or after a change in dose, call your health care provider. Women should inform their doctor if they wish to become pregnant or think they might be pregnant. There is a potential for serious side effects to an unborn child. Talk to your health care provider or pharmacist for more information. Women who become pregnant while using this medicine may enroll in the [place] Pregnancy Registry by calling [phone]. This registry collects information about the safety of antiepileptic drug use during pregnancy. This medicine may cause a decrease in folic acid and vitamin D. You should make sure that you get enough vitamins while you are taking this medicine. Discuss the foods you eat and the vitamins you take with your health care provider. What side effects may I notice from receiving this medicine? Side effects that you should report to your doctor or health care professional as soon as possible:  allergic reactions like skin rash, itching or hives, swelling of the face, lips, or tongue  changes in vision  rash, fever, and swollen lymph  nodes  redness, blistering, peeling or loosening of the skin, including inside the mouth  signs and symptoms of liver injury like dark yellow or brown urine; general ill feeling or flu-like symptoms; light-colored stools; loss of appetite; nausea; right upper belly pain; unusually weak or tired; yellowing of the eyes or skin  suicidal thoughts or other mood changes  unusual bleeding or bruising Side effects that usually do not require medical attention (report to your doctor or health care professional if they continue or are bothersome):  constipation  diarrhea  dizziness  hair loss  headache  loss of appetite  weight gain This list may not describe all possible side effects. Call your doctor for medical advice about side effects. You may report side effects to FDA at 1-800-FDA-1088. Where should I keep my medicine? Keep out of reach of children. Store at room temperature between 15 and 30 degrees C (59 and 86 degrees F). Keep container tightly closed. Throw away any unused medicine after the expiration date. NOTE: This sheet is a summary. It may not cover all possible information. If you have questions about this medicine, talk to your doctor, pharmacist, or health care provider.  2021 Elsevier/Gold Standard (2019-02-23 16:03:42)

## 2021-04-10 ENCOUNTER — Other Ambulatory Visit: Payer: Self-pay | Admitting: Psychiatry

## 2021-04-10 DIAGNOSIS — F3178 Bipolar disorder, in full remission, most recent episode mixed: Secondary | ICD-10-CM

## 2021-04-13 ENCOUNTER — Ambulatory Visit: Payer: Self-pay

## 2021-04-15 ENCOUNTER — Ambulatory Visit
Admission: RE | Admit: 2021-04-15 | Discharge: 2021-04-15 | Disposition: A | Discharge status: Home / Self Care | Payer: 59 | Source: Ambulatory Visit | Attending: Podiatry | Admitting: Podiatry

## 2021-04-15 ENCOUNTER — Other Ambulatory Visit: Payer: Self-pay

## 2021-04-15 DIAGNOSIS — M25572 Pain in left ankle and joints of left foot: Secondary | ICD-10-CM

## 2021-04-15 DIAGNOSIS — S93402D Sprain of unspecified ligament of left ankle, subsequent encounter: Secondary | ICD-10-CM | POA: Diagnosis present

## 2021-04-15 DIAGNOSIS — R262 Difficulty in walking, not elsewhere classified: Secondary | ICD-10-CM | POA: Diagnosis present

## 2021-04-15 DIAGNOSIS — M25372 Other instability, left ankle: Secondary | ICD-10-CM

## 2021-04-15 DIAGNOSIS — S93499A Sprain of other ligament of unspecified ankle, initial encounter: Secondary | ICD-10-CM | POA: Diagnosis present

## 2021-04-16 ENCOUNTER — Telehealth (INDEPENDENT_AMBULATORY_CARE_PROVIDER_SITE_OTHER): Payer: Self-pay | Admitting: Psychiatry

## 2021-04-16 DIAGNOSIS — Z5329 Procedure and treatment not carried out because of patient's decision for other reasons: Secondary | ICD-10-CM

## 2021-04-16 NOTE — Progress Notes (Signed)
No response to call or text or video invite  

## 2021-04-17 ENCOUNTER — Other Ambulatory Visit: Payer: Self-pay | Admitting: Psychiatry

## 2021-04-17 DIAGNOSIS — F411 Generalized anxiety disorder: Secondary | ICD-10-CM

## 2021-04-20 ENCOUNTER — Ambulatory Visit: Payer: 59 | Admitting: Obstetrics & Gynecology

## 2021-04-24 ENCOUNTER — Ambulatory Visit: Payer: 59 | Admitting: Adult Health

## 2021-05-02 ENCOUNTER — Other Ambulatory Visit: Payer: Self-pay | Admitting: Psychiatry

## 2021-05-02 DIAGNOSIS — F411 Generalized anxiety disorder: Secondary | ICD-10-CM

## 2021-05-04 ENCOUNTER — Other Ambulatory Visit: Payer: Self-pay | Admitting: Podiatry

## 2021-05-06 ENCOUNTER — Encounter: Payer: Self-pay | Admitting: Podiatry

## 2021-05-08 ENCOUNTER — Encounter: Payer: Self-pay | Admitting: Adult Health

## 2021-05-08 ENCOUNTER — Ambulatory Visit (INDEPENDENT_AMBULATORY_CARE_PROVIDER_SITE_OTHER): Payer: 59 | Admitting: Adult Health

## 2021-05-08 ENCOUNTER — Other Ambulatory Visit: Payer: Self-pay

## 2021-05-08 ENCOUNTER — Other Ambulatory Visit
Admission: RE | Admit: 2021-05-08 | Discharge: 2021-05-08 | Disposition: A | Discharge status: Home / Self Care | Payer: 59 | Source: Ambulatory Visit | Attending: Adult Health | Admitting: Adult Health

## 2021-05-08 VITALS — BP 124/79 | HR 81 | Ht 63.0 in | Wt 253.0 lb

## 2021-05-08 DIAGNOSIS — R5383 Other fatigue: Secondary | ICD-10-CM

## 2021-05-08 DIAGNOSIS — F909 Attention-deficit hyperactivity disorder, unspecified type: Secondary | ICD-10-CM

## 2021-05-08 DIAGNOSIS — F411 Generalized anxiety disorder: Secondary | ICD-10-CM

## 2021-05-08 DIAGNOSIS — F4010 Social phobia, unspecified: Secondary | ICD-10-CM

## 2021-05-08 DIAGNOSIS — F331 Major depressive disorder, recurrent, moderate: Secondary | ICD-10-CM

## 2021-05-08 DIAGNOSIS — F3181 Bipolar II disorder: Secondary | ICD-10-CM | POA: Diagnosis not present

## 2021-05-08 DIAGNOSIS — G4700 Insomnia, unspecified: Secondary | ICD-10-CM

## 2021-05-08 LAB — VITAMIN B12: Vitamin B-12: 343 pg/mL (ref 180–914)

## 2021-05-08 LAB — TSH: TSH: 1.702 u[IU]/mL (ref 0.350–4.500)

## 2021-05-08 LAB — VITAMIN D 25 HYDROXY (VIT D DEFICIENCY, FRACTURES): Vit D, 25-Hydroxy: 7.66 ng/mL — ABNORMAL LOW (ref 30–100)

## 2021-05-08 MED ORDER — QUETIAPINE FUMARATE 200 MG PO TABS: 200.0000 mg | ORAL_TABLET | Freq: Every day | ORAL | 5 refills | Status: DC

## 2021-05-08 MED ORDER — DULOXETINE HCL 60 MG PO CPEP: 60.0000 mg | ORAL_CAPSULE | Freq: Every day | ORAL | 5 refills | Status: DC

## 2021-05-08 MED ORDER — LISDEXAMFETAMINE DIMESYLATE 30 MG PO CAPS: 30.0000 mg | ORAL_CAPSULE | Freq: Every day | ORAL | 0 refills | Status: DC

## 2021-05-08 MED ORDER — LAMOTRIGINE ER 250 MG PO TB24: 1.0000 | ORAL_TABLET | Freq: Every day | ORAL | 5 refills | Status: DC

## 2021-05-08 NOTE — Progress Notes (Signed)
[facility] MD/PA/NP Initial Note  05/08/2021 1:04 PM Frances Lamb  MRN:  [id number]  Chief Complaint:   HPI:   Describes mood today as "ok". Pleasant. Mood symptoms - reports depression - "comes and goes", anxiety, and irritability - "over little things". Stating "I don't feel like my medications are where they need to be". Reports obsessive tendencies. Gets frustrated if she comes home and dishes are not done. Likes things in order. Has a schedule she stays on every day. Needs to leave by the same time every day. Cares about the way the house looks - but doesn't have the time or energy for it to be the way she wants it to be. Reports panic attacks - "it's rare, but I do". Having mood swings. Getting irritated over little things. Reports history of mania - spending money and being more social. Stating "besides spending money, I wish I could stay that way". She and husband doing well - "he's my best friend". Struggling with loss of libido. Does not feel like current medication is working well for her. Is open to changing medications for symptom improvement. Also notes an elevated TSH a few months ago that was not followed up with. Varying  interest and motivation. Taking medications as prescribed.  Energy levels depends - feels like she is always tired. Active, does not have a regular exercise routine.  Enjoys some usual interests and activities. Married - 3 years. Lives with husband and 55 month old son. Spending time with family. Appetite adequate - "hit or miss". Weight gain -  249 pounds. Plans to start a weight loss medication through OB. Sleeping difficulties - insomnia. Averages 8 to 12 hours with Seroquel. Focus and concentration difficulties - diagnosed with ADD in high school. Has been on Adderall and Vyvanse and felt they were helpful. completing tasks. Managing aspects of household. 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  Denies AH or VH.  Previous medication trials: Lamictal, Cymbalta,  Latuda, Hydroxyzine, Adderall, Vyvanse, Seroquel  Visit Diagnosis:    ICD-10-CM   1. Insomnia, unspecified type  G47.00 QUEtiapine (SEROQUEL) 200 MG tablet    2. Bipolar II disorder (HCC)  F31.81 QUEtiapine (SEROQUEL) 200 MG tablet    LamoTRIgine 250 MG TB24 24 hour tablet    3. Attention deficit hyperactivity disorder (ADHD), unspecified ADHD type  F90.9 lisdexamfetamine (VYVANSE) 30 MG capsule    4. Generalized anxiety disorder  F41.1 DULoxetine (CYMBALTA) 60 MG capsule    5. Social anxiety disorder  F40.10 DULoxetine (CYMBALTA) 60 MG capsule    6. Major depressive disorder, recurrent episode, moderate (HCC)  F33.1 DULoxetine (CYMBALTA) 60 MG capsule    7. GAD (generalized anxiety disorder)  F41.1 QUEtiapine (SEROQUEL) 200 MG tablet    8. Fatigue, unspecified type  R53.83 TSH    VITAMIN D 25 Hydroxy (Vit-D Deficiency, Fractures)    Vitamin B12      Past Psychiatric History: Denies psychiatric hospitalization.   Past Medical History:  Past Medical History:  Diagnosis Date   ADHD (attention deficit hyperactivity disorder)    Anxiety    Bipolar 2 disorder (HCC)    Depression    Gestational diabetes    Hypertension    Migraine    Miscarriage     Past Surgical History:  Procedure Laterality Date   CESAREAN SECTION N/A 10/28/2019   Procedure: CESAREAN SECTION;  Surgeon: Nadara Mustard, MD;  Location: [facility] ORS;  Service: Obstetrics;  Laterality: N/A;   TONSILLECTOMY AND ADENOIDECTOMY  Family Psychiatric History: Family history of mental illness.   Family History:  Family History  Problem Relation Age of Onset   Diabetes Mother    Bipolar disorder Mother    Anxiety disorder Mother    Depression Mother    OCD Mother    Diabetes Maternal Grandfather     Social History:  Social History   Socioeconomic History   Marital status: Married    Spouse name: shannon   Number of children: 1   Years of education: Not on file   Highest education level: Associate  degree: occupational, Scientist, product/process development, or vocational program  Occupational History   Not on file  Tobacco Use   Smoking status: Former    Pack years: 0.00    Types: Cigarettes    Quit date: 11/29/2012    Years since quitting: 8.4   Smokeless tobacco: Never   Tobacco comments:    current vaping weekly  Vaping Use   Vaping Use: Every day  Substance and Sexual Activity   Alcohol use: Yes    Alcohol/week: 4.0 standard drinks    Types: 2 Glasses of wine, 2 Standard drinks or equivalent per week   Drug use: Never   Sexual activity: Not Currently    Birth control/protection: Pill  Other Topics Concern   Not on file  Social History Narrative   Not on file   Social Determinants of Health   Financial Resource Strain: Not on file  Food Insecurity: Not on file  Transportation Needs: Not on file  Physical Activity: Not on file  Stress: Not on file  Social Connections: Not on file    Allergies: No Known Allergies  Metabolic Disorder Labs: Lab Results  Component Value Date   HGBA1C 5.8 (H) 10/27/2019   MPG 119.76 10/27/2019   No results found for: PROLACTIN No results found for: CHOL, TRIG, HDL, CHOLHDL, VLDL, LDLCALC Lab Results  Component Value Date   TSH 1.702 05/08/2021    Therapeutic Level Labs: No results found for: LITHIUM No results found for: VALPROATE No components found for:  CBMZ  Current Medications: Current Outpatient Medications  Medication Sig Dispense Refill   lisdexamfetamine (VYVANSE) 30 MG capsule Take 1 capsule (30 mg total) by mouth daily. 30 capsule 0   albuterol (PROVENTIL) (2.5 MG/3ML) 0.083% nebulizer solution Inhale into the lungs.     albuterol (VENTOLIN HFA) 108 (90 Base) MCG/ACT inhaler SMARTSIG:2 Inhalation Via Inhaler Every 4 Hours PRN     DULoxetine (CYMBALTA) 60 MG capsule Take 1 capsule (60 mg total) by mouth daily. 30 capsule 5   LamoTRIgine 250 MG TB24 24 hour tablet Take 1 tablet by mouth daily. 30 tablet 5   norethindrone-ethinyl  estradiol-FE (LOESTRIN FE) 1-20 MG-MCG tablet Take 1 tablet by mouth daily.     QUEtiapine (SEROQUEL) 200 MG tablet Take 1 tablet (200 mg total) by mouth at bedtime. 30 tablet 5   No current facility-administered medications for this visit.    Medication Side Effects: none  Orders placed this visit:   Orders Placed This Encounter  Procedures   TSH   VITAMIN D 25 Hydroxy (Vit-D Deficiency, Fractures)   Vitamin B12    Psychiatric Specialty Exam:  Review of Systems  Blood pressure 124/79, pulse 81, height 5\' 3"  (1.6 m), weight 253 lb (114.8 kg), not currently breastfeeding.Body mass index is 44.82 kg/m.  General Appearance: Casual and Neat  Eye Contact:  Good  Speech:  Clear and Coherent and Normal Rate  Volume:  Normal  Mood:  Anxious and Depressed  Affect:  Appropriate and Congruent  Thought Process:  Coherent and Descriptions of Associations: Intact  Orientation:  Full (Time, Place, and Person)  Thought Content: Logical   Suicidal Thoughts:  No  Homicidal Thoughts:  No  Memory:  WNL  Judgement:  Good  Insight:  Good  Psychomotor Activity:  Normal  Concentration:  Concentration: Difficulties  Recall:  Good  Fund of Knowledge: Good  Language: Good  Assets:  Communication Skills Desire for Improvement Financial Resources/Insurance Housing Intimacy Leisure Time Physical Health Resilience Social Support Talents/Skills Transportation Vocational/Educational  ADL's:  Intact  Cognition: WNL  Prognosis:  Good   Screenings:  PHQ2-9    Flowsheet Row Video Visit from 01/15/2021 in [facility] Nutrition from 10/10/2019 in [facility] [place]  PHQ-2 Total Score 2 2  PHQ-9 Total Score 5 11      Flowsheet Row ED from 03/15/2021 in [facility] Video Visit from 01/15/2021 in [facility]  C-SSRS RISK CATEGORY No Risk No Risk       Receiving Psychotherapy:  Yes  [facility] - 2 to 3 months.  Treatment Plan/Recommendations:   Plan:  PDMP reviewed  1. Lamictal 250mg  daily 2. Cymbalta 60mg  daily 3. Seroquel 200mg  at bedtime 4. Add Vyvanse 30mg  every morning  Labs: TSH, Vit D, and Vit b12 for low energy and fatigue. Elevated TSH - needs re-evaluated with mood issues.  124/79 - 81   RTC 4 weeks   Time spent with patient was 60 minutes. Greater than 50% of face to face time with patient was spent on counseling and coordination of care.     Patient advised to contact office with any questions, adverse effects, or acute worsening in signs and symptoms.   Discussed potential benefits, risks, and side effects of stimulants with patient to include increased heart rate, palpitations, insomnia, increased anxiety, increased irritability, or decreased appetite.  Instructed patient to contact office if experiencing any significant tolerability issues.   Counseled patient regarding potential benefits, risks, and side effects of Lamictal to include potential risk of Stevens-Johnson syndrome. Advised patient to stop taking Lamictal and contact office immediately if rash develops and to seek urgent medical attention if rash is severe and/or spreading quickly.    , NP

## 2021-05-11 NOTE — Progress Notes (Signed)
Please advise patient of labs - will need to see PCP for low Vitamin D.

## 2021-05-21 ENCOUNTER — Encounter
Admission: RE | Disposition: A | Discharge status: Home / Self Care | Payer: Self-pay | Source: Home / Self Care | Attending: Podiatry

## 2021-05-21 ENCOUNTER — Ambulatory Visit: Payer: 59 | Admitting: Anesthesiology

## 2021-05-21 ENCOUNTER — Encounter: Payer: Self-pay | Admitting: Podiatry

## 2021-05-21 ENCOUNTER — Ambulatory Visit
Admission: RE | Admit: 2021-05-21 | Discharge: 2021-05-21 | Disposition: A | Discharge status: Home / Self Care | Payer: 59 | Attending: Podiatry | Admitting: Podiatry

## 2021-05-21 ENCOUNTER — Other Ambulatory Visit: Payer: Self-pay

## 2021-05-21 DIAGNOSIS — Z87891 Personal history of nicotine dependence: Secondary | ICD-10-CM | POA: Insufficient documentation

## 2021-05-21 DIAGNOSIS — X58XXXA Exposure to other specified factors, initial encounter: Secondary | ICD-10-CM | POA: Diagnosis not present

## 2021-05-21 DIAGNOSIS — Z7951 Long term (current) use of inhaled steroids: Secondary | ICD-10-CM | POA: Diagnosis not present

## 2021-05-21 DIAGNOSIS — M25372 Other instability, left ankle: Secondary | ICD-10-CM | POA: Insufficient documentation

## 2021-05-21 DIAGNOSIS — Z09 Encounter for follow-up examination after completed treatment for conditions other than malignant neoplasm: Secondary | ICD-10-CM

## 2021-05-21 DIAGNOSIS — I10 Essential (primary) hypertension: Secondary | ICD-10-CM | POA: Insufficient documentation

## 2021-05-21 DIAGNOSIS — Y939 Activity, unspecified: Secondary | ICD-10-CM | POA: Diagnosis not present

## 2021-05-21 DIAGNOSIS — S93422A Sprain of deltoid ligament of left ankle, initial encounter: Secondary | ICD-10-CM | POA: Insufficient documentation

## 2021-05-21 DIAGNOSIS — E119 Type 2 diabetes mellitus without complications: Secondary | ICD-10-CM | POA: Insufficient documentation

## 2021-05-21 DIAGNOSIS — D7589 Other specified diseases of blood and blood-forming organs: Secondary | ICD-10-CM | POA: Diagnosis not present

## 2021-05-21 DIAGNOSIS — M659 Synovitis and tenosynovitis, unspecified: Secondary | ICD-10-CM | POA: Diagnosis not present

## 2021-05-21 DIAGNOSIS — S93409A Sprain of unspecified ligament of unspecified ankle, initial encounter: Secondary | ICD-10-CM | POA: Diagnosis present

## 2021-05-21 DIAGNOSIS — S93492A Sprain of other ligament of left ankle, initial encounter: Secondary | ICD-10-CM | POA: Diagnosis not present

## 2021-05-21 DIAGNOSIS — Z79899 Other long term (current) drug therapy: Secondary | ICD-10-CM | POA: Diagnosis not present

## 2021-05-21 HISTORY — PX: ANKLE RECONSTRUCTION: SHX1151

## 2021-05-21 HISTORY — PX: ANKLE ARTHROSCOPY: SHX545

## 2021-05-21 LAB — POCT PREGNANCY, URINE: Preg Test, Ur: NEGATIVE

## 2021-05-21 SURGERY — RECONSTRUCTION, ANKLE
Anesthesia: Regional | Site: Ankle | Laterality: Left

## 2021-05-21 MED ORDER — ONDANSETRON HCL 4 MG/2ML IJ SOLN
INTRAMUSCULAR | Status: DC | PRN
  Administered 2021-05-21: 4 mg via INTRAVENOUS

## 2021-05-21 MED ORDER — MIDAZOLAM HCL 5 MG/5ML IJ SOLN
INTRAMUSCULAR | Status: DC | PRN
  Administered 2021-05-21: 2 mg via INTRAVENOUS

## 2021-05-21 MED ORDER — DEXMEDETOMIDINE (PRECEDEX) IN NS 20 MCG/5ML (4 MCG/ML) IV SYRINGE
PREFILLED_SYRINGE | INTRAVENOUS | Status: DC | PRN
  Administered 2021-05-21: 5 ug via INTRAVENOUS
  Administered 2021-05-21: 10 ug via INTRAVENOUS

## 2021-05-21 MED ORDER — POVIDONE-IODINE 7.5 % EX SOLN: Freq: Once | CUTANEOUS | Status: DC

## 2021-05-21 MED ORDER — DEXAMETHASONE SODIUM PHOSPHATE 4 MG/ML IJ SOLN
INTRAMUSCULAR | Status: DC | PRN
  Administered 2021-05-21: 4 mg via INTRAVENOUS

## 2021-05-21 MED ORDER — FENTANYL CITRATE (PF) 100 MCG/2ML IJ SOLN: 25.0000 ug | INTRAMUSCULAR | Status: DC | PRN

## 2021-05-21 MED ORDER — BUPIVACAINE HCL (PF) 0.5 % IJ SOLN
INTRAMUSCULAR | Status: DC | PRN
  Administered 2021-05-21: 20 mL via PERINEURAL

## 2021-05-21 MED ORDER — ROCURONIUM BROMIDE 100 MG/10ML IV SOLN
INTRAVENOUS | Status: DC | PRN
  Administered 2021-05-21: 5 mg via INTRAVENOUS

## 2021-05-21 MED ORDER — ONDANSETRON HCL 4 MG/2ML IJ SOLN: 4.0000 mg | Freq: Once | INTRAMUSCULAR | Status: DC | PRN

## 2021-05-21 MED ORDER — ASPIRIN EC 325 MG PO TBEC: 325.0000 mg | DELAYED_RELEASE_TABLET | Freq: Two times a day (BID) | ORAL | 0 refills | Status: AC

## 2021-05-21 MED ORDER — BUPIVACAINE LIPOSOME 1.3 % IJ SUSP
INTRAMUSCULAR | Status: DC | PRN
  Administered 2021-05-21: 20 mL

## 2021-05-21 MED ORDER — OXYCODONE-ACETAMINOPHEN 5-325 MG PO TABS: 1.0000 | ORAL_TABLET | Freq: Four times a day (QID) | ORAL | 0 refills | Status: AC | PRN

## 2021-05-21 MED ORDER — FENTANYL CITRATE (PF) 100 MCG/2ML IJ SOLN
INTRAMUSCULAR | Status: DC | PRN
  Administered 2021-05-21: 50 ug via INTRAVENOUS
  Administered 2021-05-21: 100 ug via INTRAVENOUS
  Administered 2021-05-21: 50 ug via INTRAVENOUS

## 2021-05-21 MED ORDER — CEFAZOLIN SODIUM-DEXTROSE 2-4 GM/100ML-% IV SOLN
2.0000 g | INTRAVENOUS | Status: AC
  Administered 2021-05-21: 2 g via INTRAVENOUS

## 2021-05-21 MED ORDER — CEPHALEXIN 500 MG PO CAPS: 500.0000 mg | ORAL_CAPSULE | Freq: Three times a day (TID) | ORAL | 0 refills | Status: AC

## 2021-05-21 MED ORDER — LACTATED RINGERS IR SOLN
Status: DC | PRN
  Administered 2021-05-21: 9000 mL

## 2021-05-21 MED ORDER — LACTATED RINGERS IV SOLN: INTRAVENOUS | Status: DC

## 2021-05-21 MED ORDER — OXYCODONE HCL 5 MG PO TABS: 5.0000 mg | ORAL_TABLET | Freq: Once | ORAL | Status: DC | PRN

## 2021-05-21 MED ORDER — LIDOCAINE HCL (CARDIAC) PF 100 MG/5ML IV SOSY
PREFILLED_SYRINGE | INTRAVENOUS | Status: DC | PRN
  Administered 2021-05-21: 60 mg via INTRAVENOUS

## 2021-05-21 MED ORDER — PROPOFOL 10 MG/ML IV BOLUS
INTRAVENOUS | Status: DC | PRN
  Administered 2021-05-21: 200 mg via INTRAVENOUS
  Administered 2021-05-21 (×2): 50 mg via INTRAVENOUS

## 2021-05-21 MED ORDER — SUCCINYLCHOLINE CHLORIDE 20 MG/ML IJ SOLN
INTRAMUSCULAR | Status: DC | PRN
  Administered 2021-05-21: 100 mg via INTRAVENOUS

## 2021-05-21 MED ORDER — PROPOFOL 10 MG/ML IV BOLUS: INTRAVENOUS | Status: DC | PRN

## 2021-05-21 MED ORDER — OXYCODONE HCL 5 MG/5ML PO SOLN
5.0000 mg | Freq: Once | ORAL | Status: DC | PRN
Start: 2021-05-21 — End: 2021-05-21

## 2021-05-21 MED ORDER — ONDANSETRON HCL 4 MG PO TABS: 4.0000 mg | ORAL_TABLET | Freq: Three times a day (TID) | ORAL | 0 refills | Status: DC | PRN

## 2021-05-21 SURGICAL SUPPLY — 51 items
ANCH SUT NDL DX FBRTK STRL LF (Anchor) ×3 IMPLANT
ANCHOR SUT FBRTK 1.3 SUTTAP (Anchor) ×3 IMPLANT
BNDG CMPR STD VLCR NS LF 5.8X4 (GAUZE/BANDAGES/DRESSINGS) ×2
BNDG COHESIVE 4X5 TAN STRL (GAUZE/BANDAGES/DRESSINGS) ×2 IMPLANT
BNDG ELASTIC 4X5.8 VLCR NS LF (GAUZE/BANDAGES/DRESSINGS) ×4 IMPLANT
BNDG ESMARK 4X12 TAN STRL LF (GAUZE/BANDAGES/DRESSINGS) ×2 IMPLANT
BUR AGGRESSIVE+ 2.5 (BURR) ×2 IMPLANT
COVER LIGHT HANDLE FLEXIBLE (MISCELLANEOUS) ×4 IMPLANT
CUFF TOURN SGL QUICK 30 (TOURNIQUET CUFF) ×2
CUFF TRNQT CYL 30X4X21-28X (TOURNIQUET CUFF) IMPLANT
DRAPE FLUOR MINI C-ARM 54X84 (DRAPES) ×1 IMPLANT
DRAPE STERI 35X30 U-POUCH (DRAPES) ×2 IMPLANT
DURAPREP 26ML APPLICATOR (WOUND CARE) ×2 IMPLANT
GAUZE SPONGE 4X4 12PLY STRL (GAUZE/BANDAGES/DRESSINGS) ×2 IMPLANT
GAUZE XEROFORM 1X8 LF (GAUZE/BANDAGES/DRESSINGS) ×2 IMPLANT
GLOVE SURG ENC MOIS LTX SZ7 (GLOVE) ×2 IMPLANT
GLOVE SURG POLYISO LF SZ7 (GLOVE) ×2 IMPLANT
GOWN STRL REUS W/ TWL LRG LVL3 (GOWN DISPOSABLE) ×2 IMPLANT
GOWN STRL REUS W/TWL LRG LVL3 (GOWN DISPOSABLE) ×4
IMPL INTERNAL BRACE BIO (Anchor) IMPLANT
IMPLANT INTERNAL BRACE BIO (Anchor) ×2 IMPLANT
IMPLANT SCP KIT FOOT ANKLE 3 (Orthopedic Implant) ×2 IMPLANT
IV LACTATED RINGER IRRG 3000ML (IV SOLUTION) ×8
IV LR IRRIG 3000ML ARTHROMATIC (IV SOLUTION) ×4 IMPLANT
IV NS 250ML (IV SOLUTION) ×2
IV NS 250ML BAXH (IV SOLUTION) ×1 IMPLANT
KIT ACCUFILL 3CC (Orthopedic Implant) IMPLANT
KIT FIBERTAK DX 1.6 DISP (KITS) ×1 IMPLANT
KIT TURNOVER KIT A (KITS) ×2 IMPLANT
MANIFOLD 4PT FOR NEPTUNE1 (MISCELLANEOUS) ×2 IMPLANT
NDL SPNL 18GX3.5 QUINCKE PK (NEEDLE) ×1 IMPLANT
NEEDLE SPNL 18GX3.5 QUINCKE PK (NEEDLE) ×2 IMPLANT
PACK EXTREMITY ARMC (MISCELLANEOUS) ×2 IMPLANT
PADDING CAST BLEND 4X4 NS (MISCELLANEOUS) ×8 IMPLANT
PENCIL SMOKE EVACUATOR (MISCELLANEOUS) ×2 IMPLANT
SCP FOOT & ANKLE KIT ×1 IMPLANT
SPLINT CAST 1 STEP 4X30 (MISCELLANEOUS) ×2 IMPLANT
STOCKINETTE IMPERVIOUS LG (DRAPES) ×2 IMPLANT
STOCKINETTE TUB 6IN (MISCELLANEOUS) ×2 IMPLANT
STRAP ANKLE DISTRACTOR (MISCELLANEOUS) ×1 IMPLANT
STRAP BODY AND KNEE 60X3 (MISCELLANEOUS) ×2 IMPLANT
SUT ETHILON 3-0 (SUTURE) ×2 IMPLANT
SUT ETHILON 4-0 (SUTURE) ×2
SUT ETHILON 4-0 FS2 18XMFL BLK (SUTURE) ×1
SUT VIC AB 2-0 SH 27 (SUTURE) ×2
SUT VIC AB 2-0 SH 27XBRD (SUTURE) IMPLANT
SUT VIC AB 3-0 SH 27 (SUTURE) ×4
SUT VIC AB 3-0 SH 27X BRD (SUTURE) IMPLANT
SUTURE ETHLN 4-0 FS2 18XMF BLK (SUTURE) IMPLANT
SYR BULB IRRIG 60ML STRL (SYRINGE) ×2 IMPLANT
TUBING ARTHRO INFLOW-ONLY STRL (TUBING) ×2 IMPLANT

## 2021-05-21 NOTE — Anesthesia Preprocedure Evaluation (Addendum)
Anesthesia Evaluation  Patient identified by MRN, date of birth, ID band Patient awake    Reviewed: Allergy & Precautions, NPO status , Patient's Chart, lab work & pertinent test results  Airway Mallampati: III  TM Distance: >3 FB Neck ROM: Full    Dental no notable dental hx.    Pulmonary former smoker (vapes),    Pulmonary exam normal        Cardiovascular hypertension, Normal cardiovascular exam     Neuro/Psych  Headaches, PSYCHIATRIC DISORDERS Anxiety Depression Bipolar Disorder    GI/Hepatic negative GI ROS, Neg liver ROS,   Endo/Other  diabetes, Type obesity (BMI 45)  Renal/GU negative Renal ROS     Musculoskeletal fracture of left ankle    Abdominal (+) + obese,   Peds  Hematology negative hematology ROS (+)   Anesthesia Other Findings   Reproductive/Obstetrics negative OB ROS                            Anesthesia Physical Anesthesia Plan  ASA: 3  Anesthesia Plan: General and Regional   Post-op Pain Management: GA combined w/ Regional for post-op pain   Induction: Intravenous  PONV Risk Score and Plan: 3 and Ondansetron, Midazolam and Treatment may vary due to age or medical condition  Airway Management Planned: Oral ETT  Additional Equipment: None  Intra-op Plan:   Post-operative Plan: Extubation in OR  Informed Consent: I have reviewed the patients History and Physical, chart, labs and discussed the procedure including the risks, benefits and alternatives for the proposed anesthesia with the patient or authorized representative who has indicated his/her understanding and acceptance.     Dental advisory given  Plan Discussed with: CRNA  Anesthesia Plan Comments: (Popliteal and adductor canal nerve block with Exparel for postoperative pain control GA with ETT)       Anesthesia Quick Evaluation

## 2021-05-21 NOTE — Op Note (Signed)
PODIATRY / FOOT AND ANKLE SURGERY OPERATIVE REPORT    SURGEON: Rosetta Posner, DPM  PRE-OPERATIVE DIAGNOSIS: All left foot/ankle 1.  Ankle synovitis with osteochondral defect of the talar dome laterally 2.  Bone marrow edema talus 3.  Complete rupture of the ATFL ankle ligament with lateral ankle instability 4.  Complete tear of deep deltoid ligament with minor tearing of the superficial deltoid medial ankle with medial ankle instability  POST-OPERATIVE DIAGNOSIS: Same  PROCEDURE(S): All left foot and ankle 1.  Ankle arthroscopy with extensive debridement 2.  Subchondroplasty talus 3.  Osteochondral defect microfracture repair of talus 4.  Brostrm Gould with internal brace lateral ankle stabilization 5.  Deltoid ligament repair medial ankle  HEMOSTASIS: Left thigh tourniquet  ANESTHESIA: general  ESTIMATED BLOOD LOSS: 30 cc  FINDING(S): 1.  Osteochondral defect of the lateral left talar dome which measured approximately 8 mm by 3 mm 2.  Complete tear of left ATFL ankle ligament 3.  Complete tear of left deep deltoid ligament, superficial intact 4.  Left ankle synovitis  PATHOLOGY/SPECIMEN(S): None  INDICATIONS:   Frances Lamb is a 25 y.o. female who presents with chronic ankle instability and ankle pain.  Patient suffered a fall fairly recently when she inverted her ankle and sprained it.  She has a history of chronic ankle sprains.  An MRI was taken which showed complete tear of the ATFL ligament, complete tear of the deep deltoid, superficial deltoid ligament appeared to be intact, synovitis of the ankle joint, osteochondral defect of the lateral talar dome, and bone marrow edema within the talus.  All treatment options were discussed with the patient both conservative and surgical attempts at correction clean potential risks and complications at this time patient is elected for surgical intervention consisting of procedures listed above.  No guarantees given.  Consent signed and  placed in chart..  DESCRIPTION: After obtaining full informed written consent, the patient was brought back to the operating room and placed supine upon the operating table.  The patient received IV antibiotics prior to induction.  The patient received a preoperative popliteal and saphenous nerve block before procedure.  After obtaining adequate anesthesia, a pneumatic thigh tourniquet was placed about the patient's left thigh.  The patient was prepped and draped in the standard fashion.  An Esmarch bandage was used to exsanguinate the left lower extremity and pneumatic thigh tourniquet was inflated.  Attention was then directed to the anterior aspect the left ankle where a small percutaneous incision was made medial to the tendon of the tibialis anterior at the level of the ankle joint creating the medial portal for the ankle joint.  The ankle joint was insufflated with 10 cc of lactated Ringer's.  The incision was then deepened through the subcutaneous tissues utilizing sharp and blunt dissection care was taken to identify and retract all vital neurovascular structures all venous contributories were cauterized as necessary.  At this time the ankle joint was punctured with a hemostat and opened and fluid came out of the ankle joint indicating successful entry into the joint.  The arthroscopic instrumentation was then was inserted through the medial portal and the ankle joint was examined.  There appeared to be articular cartilage damage at the lateral aspect of the talar dome which measured approximately 8 mm x 3 mm at the lateral gutter area at the anterior aspect.  There also appeared to be a large amount of synovitis at the medial and lateral aspects of the ankle joint.  The ATFL  appeared to be ruptured completely based on arthroscopic examination.  The lateral portal was then made at this time as the lateral ankle joint capsule was transilluminated and a small percutaneous stab incision was made in this  area as well.  Hemostat was used to blunt dissect through down to the ankle capsule which was punctured.  The shaver was then introduced through the lateral portal.  Extensive debridement was then performed of all synovitic tissue including a portion of the ATFL ankle ligament.  Attention was directed to the lateral aspect of the talar dome where there was a notable cartilaginous lesion. This lesion measured approximately 8 millimeters by three millimeters. The cartilaginous surface that appeared to be loosened was lifted off the bone with accurate and removed taking care not to damage the subchondral bone plate. The subchondral bone plate appeared to be intact. The instrumentation was then switched and debridement was performed of all synovitc tissue at the medial gutter and medial aspect of the ankle. No cartilaginous lesions were noted at this area. There did appear some fraying of the deep deltoid ligament that was visualized under arthroscopic assessment indicative of at least a partial tear to potentially complete tear.  At this time the instrumentation was then switched once again and the scope was placed through the medial portal for direct visualization of the osteochondral defect. Utilizing fluoroscopic guidance a guidewire and cannula was then drilled from the medial neck of the talus and directed to the lateral aspect of the talar dome in the area of bone marrow edema. Under fluoroscopic guidance 1cc of subchondroplasty was then injected into the area. This was able to be visualized under fluoroscopy and also with the arthroscope. There did not appear to be any leakage into the ankle joint. This was allowed to set for 10 minutes. After this set micro fracturing was then performed to the osteochondral defect of the talus. The joint was then suctioned dry and instrumentation was removed. The scope portals were then reapproximated and well coapted with 3-0 nylon.  The ankle joint was stressed under  fluoroscopy and was noted to have talar tilt with both valgus and varus stress tests indicating ligamentous injuries/ruptures medially and laterally.  Attention was then directed to the lateral ankle. A J type of incision was made starting at the lateral malleolus extending to the distal tip of the fibula and extending over the course of the ATFL. The incision was deep and through the subcutaneous tissue with blunt dissection. All venous contributories were cauterized as necessary and neurovascular structures were retracted. At this time the extensor retinaculum was identified as well as the ankle joint capsule and ATFL. There also appeared to be a small piece of distal fibula which was avulsed at the origin point of the ankle ligament. At this time a capsular incision was made as well as an incision into the ATFL. A full thickness cuff of tissue was raised off of the distal fibula. At this time the arthrex talar component of the internal brace was placed through the sinus tarsi and into the talus.  This was done under fluoroscopic guidance. 2 fibertak arthrex anchors were then placed into the distal fibula. The sutures from these anchors were then placed through the remnant ATFL tissue and ankle capsule and tide down to the fibula with the foot held in the neutral position completing the [place] course of the procedure. The internal brace was then placed into the distal fibula utilizing standard AO techniques while holding the appropriate  tension. The extensor retinaculum was then sutured to the cuff of tissue raised off the distal fibula with 2-0 vicryl.  A flash was performed with copious amounts of normal sterile saline. The subcutaneous tissue was then reapproximated and well coapted with 3-0 vicryl and the skin was closed with 3-0 nylon. Inversion stress was then applied to the ankle under fluoroscopy and no talar tilt was noted. The pneumatic thigh tourniquet was deflated during closure.  Attention was  then directed to the medial ankle where a J type of incision was made from the medial malleolus and distal tibia to the deltoid ligament region.  The incision was deep and through the subcutaneous tissue with blunt dissection. All venous contributories were cauterized as necessary and neurovascular structures were retracted.  An eschmark bandage was used to exsanginuated the extremity and the thigh tourniquet was inflated again.  An incision was then made through the superficial deltoid and deep deltoid as there appeared to be small tears in both ligaments. A rongeur was used to remove some fibrous debris from this area.  Another Arthrex fibertak anchor was placed at the tip of medial malleolus. The sutures were then placed through the superficial and deep deltoid remnant tissue and while holding the foot in a neutral position the sutures were tide reconstructing the deep and superficial deltoid. Reinforcement of the repair was performed with 2-0 vicryl.  Stress imaging was once again performed with fluoroscopic guidance. The ankle appeared to be very stable with no talar tilting. the surgical site was flushed with copious amounts of normal sterile saline. The subcutaneous tissue was then reapproximated and well coapted with 3-0 vicryl and the skin was reapproximated and well coapted with 3-0 nylon.  The tourniquet was deflated and a prompt hyperemic response was noted to all digits of the left foot.  A postop dressing was applied consisting of xeroform to incisions, 4x4, ABD, kerlix, webril, posterior splint, ace wrap.  The patient tolerated the procedure and anesthesia well and was transferred to the recovery room with vital signs stable and vascular status intact to all toes of the left foot. The patient will be discharged home with the appropriate orders, medications, and instructions. Patient to follow up one week after surgery.   COMPLICATIONS: none  CONDITION: good, stable  Rosetta Posner, DPM

## 2021-05-21 NOTE — H&P (Signed)
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  The various methods of treatment have been discussed with the patient.  No guarantees were given.  After consideration of risks, benefits and other options for treatment, the patient has consented to surgery.  I have reviewed the patients' chart and labs.    PROCEDURE: ALL LEFT ANKLE ANKLE ARTHROSCOPY WITH EXTENSIVE DEBRIDEMENT OCD DEFECT REPAIR WITH MICROFRACTURE AND DEBRIDEMENT POSSIBLE SUBCHONDROPLASTY INJECTION TALUS LATERAL ANKLE LIGAMENT REPAIR (BROSTROM-GOULD WITH INTERNAL BRACE) DELTOID LIGAMENT RECONSTRUCTION MEDIAL ANKLE  A history and physical examination was performed in my office.  The patient was reexamined.  There have been no changes to this history and physical examination.  Rosetta Posner, DPM

## 2021-05-21 NOTE — Anesthesia Procedure Notes (Signed)
Anesthesia Regional Block: Adductor canal block   Pre-Anesthetic Checklist: , timeout performed,  Correct Patient, Correct Site, Correct Laterality,  Correct Procedure, Correct Position, site marked,  Risks and benefits discussed,  Surgical consent,  Pre-op evaluation,  At surgeon's request and post-op pain management  Laterality: Left and Lower  Prep: chloraprep       Needles:  Injection technique: Single-shot  Needle Type: Echogenic Needle     Needle Length: 9cm  Needle Gauge: 21     Additional Needles:   Procedures:,,,, ultrasound used (permanent image in chart),,    Narrative:  Start time: 05/21/2021 7:06 AM End time: 05/21/2021 7:16 AM Injection made incrementally with aspirations every 5 mL.  Performed by: Personally  Anesthesiologist: Fletcher Anon, MD  Additional Notes: Functioning IV was confirmed and monitors applied. Ultrasound guidance: relevant anatomy identified, needle position confirmed, local anesthetic spread visualized around nerve(s)., vascular puncture avoided.  Image printed for medical record.  Negative aspiration and no paresthesias; incremental administration of local anesthetic. The patient tolerated the procedure well. Vitals signes recorded in RN notes.

## 2021-05-21 NOTE — Anesthesia Postprocedure Evaluation (Signed)
Anesthesia Post Note  Patient: Frances Lamb  Procedure(s) Performed: RECONSTRUCTION ANKLE- Brostrum-Gould x2 (Left: Ankle) ANKLE ARTHROSCOPY- OCD Repair, Debridement; Extensive (Left: Ankle)     Patient location during evaluation: PACU Anesthesia Type: Regional Level of consciousness: awake and alert Pain management: pain level controlled Vital Signs Assessment: post-procedure vital signs reviewed and stable Respiratory status: spontaneous breathing and nonlabored ventilation Cardiovascular status: blood pressure returned to baseline Postop Assessment: no apparent nausea or vomiting Anesthetic complications: no   No notable events documented.  Jandi Swiger [employer]

## 2021-05-21 NOTE — Discharge Instructions (Signed)
[facility]  POST OPERATIVE INSTRUCTIONS FOR DR. Ether Griffins AND DR. Tatijana Bierly [facility] PODIATRY DEPARTMENT   Take your medication as prescribed.  Pain medication should be taken only as needed.  You may take ibuprofen and Tylenol as well to supplement the pain medication and you may take between doses.  If pain is still severe and out of control then you could try taking 1 pain medication tablet every 4 hours.  If pain persists and is still severe and out of control please call clinic for further instruction.  Take aspirin 325 mg twice daily starting tomorrow.  Take antibiotic as prescribed until gone.  Take antinausea medicine as needed.  Keep the dressing clean, dry and intact.  Remain nonweightbearing at all times to left lower extremity with usage of knee scooter, crutches, or wheelchair.  Keep your foot elevated above the heart level for the first 48 hours.  Continue elevation thereafter to improve swelling.  Do not take a shower. Baths are permissible as long as the foot is kept out of the water.   Every hour you are awake:  Bend your knee 15 times. Massage calf 15 times  Call [facility] [phone]) if any of the following problems occur: You develop a temperature or fever. The bandage becomes saturated with blood. Medication does not stop your pain. Injury of the foot occurs. Any symptoms of infection including redness, odor, or red streaks running from wound.

## 2021-05-21 NOTE — Anesthesia Procedure Notes (Signed)
Anesthesia Regional Block: Popliteal block   Pre-Anesthetic Checklist: , timeout performed,  Correct Patient, Correct Site, Correct Laterality,  Correct Procedure, Correct Position, site marked,  Risks and benefits discussed,  Surgical consent,  Pre-op evaluation,  At surgeon's request and post-op pain management  Laterality: Left and Lower  Prep: chloraprep       Needles:  Injection technique: Single-shot  Needle Type: Echogenic Needle     Needle Length: 9cm  Needle Gauge: 21     Additional Needles:   Procedures:,,,, ultrasound used (permanent image in chart),,    Narrative:  Start time: 05/21/2021 7:06 AM End time: 05/21/2021 7:16 AM Injection made incrementally with aspirations every 5 mL.  Performed by: Personally  Anesthesiologist: Fletcher Anon, MD  Additional Notes: Functioning IV was confirmed and monitors applied. Ultrasound guidance: relevant anatomy identified, needle position confirmed, local anesthetic spread visualized around nerve(s)., vascular puncture avoided.  Image printed for medical record.  Negative aspiration and no paresthesias; incremental administration of local anesthetic. The patient tolerated the procedure well. Vitals signes recorded in RN notes.

## 2021-05-21 NOTE — Transfer of Care (Signed)
Immediate Anesthesia Transfer of Care Note  Patient: Frances Lamb  Procedure(s) Performed: RECONSTRUCTION ANKLE- Brostrum-Gould x2 (Left: Ankle) ANKLE ARTHROSCOPY- OCD Repair, Debridement; Extensive (Left: Ankle)  Patient Location: PACU  Anesthesia Type: General, Regional  Level of Consciousness: awake, alert  and patient cooperative  Airway and Oxygen Therapy: Patient Spontanous Breathing and Patient connected to supplemental oxygen  Post-op Assessment: Post-op Vital signs reviewed, Patient's Cardiovascular Status Stable, Respiratory Function Stable, Patent Airway and No signs of Nausea or vomiting  Post-op Vital Signs: Reviewed and stable  Complications: No notable events documented.

## 2021-05-21 NOTE — Anesthesia Procedure Notes (Signed)
Procedure Name: Intubation Date/Time: 05/21/2021 7:37 AM Performed by: Dionne Bucy, CRNA Pre-anesthesia Checklist: Patient identified, Emergency Drugs available, Suction available and Patient being monitored Patient Re-evaluated:Patient Re-evaluated prior to induction Oxygen Delivery Method: Circle system utilized Preoxygenation: Pre-oxygenation with 100% oxygen Induction Type: IV induction Ventilation: Mask ventilation without difficulty Laryngoscope Size: Glidescope and 3 Grade View: Grade I Tube type: Oral Tube size: 7.0 mm Number of attempts: 2 (DL x1 with MAC 3 with only epiglottis visible) Airway Equipment and Method: Stylet and Video-laryngoscopy Placement Confirmation: ETT inserted through vocal cords under direct vision, positive ETCO2 and breath sounds checked- equal and bilateral Secured at: 21 cm Tube secured with: Tape Dental Injury: Teeth and Oropharynx as per pre-operative assessment  Difficulty Due To: Difficulty was anticipated, Difficult Airway- due to limited oral opening and Difficult Airway- due to anterior larynx

## 2021-05-22 NOTE — OR Nursing (Signed)
Chart entered to correct implant information.

## 2021-05-25 ENCOUNTER — Encounter: Payer: Self-pay | Admitting: Podiatry

## 2021-06-05 ENCOUNTER — Other Ambulatory Visit (HOSPITAL_COMMUNITY)
Admission: RE | Admit: 2021-06-05 | Discharge: 2021-06-05 | Disposition: A | Discharge status: Home / Self Care | Payer: 59 | Source: Ambulatory Visit | Attending: Obstetrics & Gynecology | Admitting: Obstetrics & Gynecology

## 2021-06-05 ENCOUNTER — Ambulatory Visit (INDEPENDENT_AMBULATORY_CARE_PROVIDER_SITE_OTHER): Payer: 59 | Admitting: Obstetrics & Gynecology

## 2021-06-05 ENCOUNTER — Other Ambulatory Visit: Payer: Self-pay

## 2021-06-05 ENCOUNTER — Encounter: Payer: Self-pay | Admitting: Obstetrics & Gynecology

## 2021-06-05 VITALS — BP 120/80 | Ht 62.0 in | Wt 250.0 lb

## 2021-06-05 DIAGNOSIS — Z01419 Encounter for gynecological examination (general) (routine) without abnormal findings: Secondary | ICD-10-CM

## 2021-06-05 DIAGNOSIS — Z124 Encounter for screening for malignant neoplasm of cervix: Secondary | ICD-10-CM | POA: Insufficient documentation

## 2021-06-05 DIAGNOSIS — Z3041 Encounter for surveillance of contraceptive pills: Secondary | ICD-10-CM | POA: Diagnosis not present

## 2021-06-05 MED ORDER — NORETHIN ACE-ETH ESTRAD-FE 1-20 MG-MCG PO TABS: 1.0000 | ORAL_TABLET | Freq: Every day | ORAL | 3 refills | Status: DC

## 2021-06-05 MED ORDER — PHENTERMINE HCL 37.5 MG PO TABS: ORAL_TABLET | ORAL | 0 refills | Status: DC

## 2021-06-05 NOTE — Patient Instructions (Signed)
Thank you for choosing [facility]. As part of our ongoing efforts to improve patient experience, we would appreciate your feedback. Please fill out the short survey that you will receive by mail or MyChart. Your opinion is important to [place]! -Dr Tiburcio Pea  Phentermine tablets or capsules What is this medication? PHENTERMINE (FEN ter meen) decreases your appetite. It is used with a reducedcalorie diet and exercise to help you lose weight. This medicine may be used for other purposes; ask your health care provider orpharmacist if you have questions. COMMON BRAND NAME(S): Adipex-P, Atti-Plex P, Atti-Plex P Spansule, Fastin,Lomaira, Pro-Fast, Tara-8 What should I tell my care team before I take this medication? They need to know if you have any of these conditions: agitation or nervousness diabetes glaucoma heart disease high blood pressure history of drug abuse or addiction history of stroke kidney disease lung disease called Primary Pulmonary Hypertension (PPH) taken an MAOI like Carbex, Eldepryl, Marplan, Nardil, or Parnate in last 14 days taking stimulant medicines for attention disorders, weight loss, or to stay awake thyroid disease an unusual or allergic reaction to phentermine, other medicines, foods, dyes, or preservatives pregnant or trying to get pregnant breast-feeding How should I use this medication? Take this medicine by mouth with a glass of water. Follow the directions on the prescription label. Take your medicine at regular intervals. Do not take itmore often than directed. Do not stop taking except on your doctor's advice. Talk to your pediatrician regarding the use of this medicine in children. While this drug may be prescribed for children 17 years or older for selectedconditions, precautions do apply. Overdosage: If you think you have taken too much of this medicine contact apoison control center or emergency room at once. NOTE: This medicine is only for you. Do not  share this medicine with others. What if I miss a dose? If you miss a dose, take it as soon as you can. If it is almost time for yournext dose, take only that dose. Do not take double or extra doses. What may interact with this medication? Do not take this medicine with any of the following medications: MAOIs like Carbex, Eldepryl, Marplan, Nardil, and Parnate This medicine may also interact with the following medications: alcohol certain medicines for depression, anxiety, or psychotic disorders certain medicines for high blood pressure linezolid medicines for colds or breathing difficulties like pseudoephedrine or phenylephrine medicines for diabetes sibutramine stimulant medicines for attention disorders, weight loss, or to stay awake This list may not describe all possible interactions. Give your health care provider a list of all the medicines, herbs, non-prescription drugs, or dietary supplements you use. Also tell them if you smoke, drink alcohol, or use illegaldrugs. Some items may interact with your medicine. What should I watch for while using this medication? Visit your doctor or health care provider for regular checks on your progress. Do not stop taking except on your health care provider's advice. You may develop a severe reaction. Your health care provider will tell you how muchmedicine to take. Do not take this medicine close to bedtime. It may prevent you from sleeping. You may get drowsy or dizzy. Do not drive, use machinery, or do anything that needs mental alertness until you know how this medicine affects you. Do not stand or sit up quickly, especially if you are an older patient. This reduces the risk of dizzy or fainting spells. Alcohol may increase dizziness anddrowsiness. Avoid alcoholic drinks. This medicine may affect blood sugar levels. Ask your healthcare  provider ifchanges in diet or medicines are needed if you have diabetes. Women should inform their health care  provider if they wish to become pregnant or think they might be pregnant. Losing weight while pregnant is not advised and may cause harm to the unborn child. Talk to your health care provider formore information. What side effects may I notice from receiving this medication? Side effects that you should report to your doctor or health care professionalas soon as possible: allergic reactions like skin rash, itching or hives, swelling of the face, lips, or tongue breathing problems changes in emotions or moods changes in vision chest pain or chest tightness fast, irregular heartbeat feeling faint or lightheaded increased blood pressure irritable restlessness tremors seizures signs and symptoms of a stroke like changes in vision; confusion; trouble speaking or understanding; severe headaches; sudden numbness or weakness of the face, arm or leg; trouble walking; dizziness; loss of balance or coordination unusually weak or tired Side effects that usually do not require medical attention (report to yourdoctor or health care professional if they continue or are bothersome): changes in taste constipation or diarrhea dizziness dry mouth headache trouble sleeping upset stomach This list may not describe all possible side effects. Call your doctor for medical advice about side effects. You may report side effects to FDA at1-800-FDA-1088. Where should I keep my medication? Keep out of the reach of children. This medicine can be abused. Keep your medicine in a safe place to protect it from theft. Do not share this medicine with anyone. Selling or giving away this medicine is dangerous and against thelaw. This medicine may cause harm and death if it is taken by other adults, children, or pets. Return medicine that has not been used to an official disposal site. Contact the DEA at (347) 210-1276 or your city/county government to find a site. If you cannot return the medicine, mix any unused medicine  with a substance like cat litter or coffee grounds. Then throw the medicine away in a sealed container like a sealed bag or coffee can with a lid. Do not use themedicine after the expiration date. Store at room temperature between 20 and 25 degrees C (68 and 77 degrees F).Keep container tightly closed. NOTE: This sheet is a summary. It may not cover all possible information. If you have questions about this medicine, talk to your doctor, pharmacist, orhealth care provider.  2022 Elsevier/Gold Standard (2019-09-21 12:54:20)

## 2021-06-05 NOTE — Progress Notes (Signed)
HPI:      [name] is a 25 y.o. G2P1011 who LMP was No LMP recorded., she presents today for her annual examination. The patient has no complaints today other than continued diffculty in weight loss.  Max weight now (except w pregnancy).  Desires to be 170s.  Current OCPs do better, no BTB.  The patient is sexually active. Her last pap: was normal. The patient does perform self breast exams.  There is no notable family history of breast or ovarian cancer in her family.  The patient has regular exercise: yes.  The patient denies current symptoms of depression.    GYN History: Contraception: OCP (estrogen/progesterone)  PMHx: Past Medical History:  Diagnosis Date   ADHD (attention deficit hyperactivity disorder)    Anxiety    Bipolar 2 disorder (HCC)    Depression    Gestational diabetes    Hypertension    Migraine    Miscarriage    Past Surgical History:  Procedure Laterality Date   ANKLE ARTHROSCOPY Left 05/21/2021   Procedure: ANKLE ARTHROSCOPY- OCD Repair, Debridement; Extensive;  Surgeon: Frances Lamb, DPM;  Location[facility];  Service: Podiatry;  Laterality: Left;   ANKLE RECONSTRUCTION Left 05/21/2021   Procedure: RECONSTRUCTION ANKLE- Brostrum-Gould x2;  Surgeon: Frances Lamb, DPM;  Location: [facility];  Service: Podiatry;  Laterality: Left;  Choice, pre-op pop/saph block   CESAREAN SECTION N/A 10/28/2019   Procedure: CESAREAN SECTION;  Surgeon: Frances Mustard, MD;  Location: [facility];  Service: Obstetrics;  Laterality: N/A;   TONSILLECTOMY AND ADENOIDECTOMY     Family History  Problem Relation Age of Onset   Diabetes Mother    Bipolar disorder Mother    Anxiety disorder Mother    Depression Mother    OCD Mother    Diabetes Maternal Grandfather    Social History   Tobacco Use   Smoking status: Former    Pack years: 0.00    Types: Cigarettes    Quit date: 11/29/2012    Years since quitting: 8.5   Smokeless tobacco: Never   Tobacco  comments:    current vaping weekly  Vaping Use   Vaping Use: Every day  Substance Use Topics   Alcohol use: Yes    Alcohol/week: 4.0 standard drinks    Types: 2 Glasses of wine, 2 Standard drinks or equivalent per week   Drug use: Never    Current Outpatient Medications:    albuterol (PROVENTIL) (2.5 MG/3ML) 0.083% nebulizer solution, Inhale into the lungs., Disp: , Rfl:    albuterol (VENTOLIN HFA) 108 (90 Base) MCG/ACT inhaler, SMARTSIG:2 Inhalation Via Inhaler Every 4 Hours PRN, Disp: , Rfl:    aspirin EC 325 MG tablet, Take 1 tablet (325 mg total) by mouth 2 (two) times daily at 8 am and 10 pm., Disp: 84 tablet, Rfl: 0   DULoxetine (CYMBALTA) 60 MG capsule, Take 1 capsule (60 mg total) by mouth daily., Disp: 30 capsule, Rfl: 5   LamoTRIgine 250 MG TB24 24 hour tablet, Take 1 tablet by mouth daily., Disp: 30 tablet, Rfl: 5   lisdexamfetamine (VYVANSE) 30 MG capsule, Take 1 capsule (30 mg total) by mouth daily., Disp: 30 capsule, Rfl: 0   ondansetron (ZOFRAN) 4 MG tablet, Take 1 tablet (4 mg total) by mouth every 8 (eight) hours as needed for nausea or vomiting., Disp: 20 tablet, Rfl: 0   phentermine (ADIPEX-P) 37.5 MG tablet, One tablet po in morning., Disp: 30 tablet, Rfl: 0  QUEtiapine (SEROQUEL) 200 MG tablet, Take 1 tablet (200 mg total) by mouth at bedtime., Disp: 30 tablet, Rfl: 5   norethindrone-ethinyl estradiol-FE (LOESTRIN FE) 1-20 MG-MCG tablet, Take 1 tablet by mouth daily., Disp: 84 tablet, Rfl: 3 Allergies: Patient has no known allergies.  Review of Systems  Constitutional:  Negative for chills, fever and malaise/fatigue.  HENT:  Negative for congestion, sinus pain and sore throat.   Eyes:  Negative for blurred vision and pain.  Respiratory:  Negative for cough and wheezing.   Cardiovascular:  Negative for chest pain and leg swelling.  Gastrointestinal:  Positive for constipation. Negative for abdominal pain, diarrhea, heartburn, nausea and vomiting.  Genitourinary:   Negative for dysuria, frequency, hematuria and urgency.  Musculoskeletal:  Negative for back pain, joint pain, myalgias and neck pain.  Skin:  Negative for itching and rash.  Neurological:  Negative for dizziness, tremors and weakness.  Endo/Heme/Allergies:  Bruises/bleeds easily.  Psychiatric/Behavioral:  Positive for depression. The patient is nervous/anxious. The patient does not have insomnia.    Objective: BP 120/80   Ht 5\' 2"  (1.575 m)   Wt 250 lb (113.4 kg)   BMI 45.73 kg/m   Filed Weights   06/05/21 1400  Weight: 250 lb (113.4 kg)   Body mass index is 45.73 kg/m. Physical Exam Constitutional:      General: She is not in acute distress.    Appearance: She is well-developed. She is obese.  Genitourinary:     Bladder, rectum and urethral meatus normal.     No lesions in the vagina.     Right Labia: No rash, tenderness or lesions.    Left Labia: No tenderness, lesions or rash.    No vaginal bleeding.      Right Adnexa: not tender and no mass present.    Left Adnexa: not tender and no mass present.    No cervical motion tenderness, friability, lesion or polyp.     Uterus is not enlarged.     No uterine mass detected.    Pelvic exam was performed with patient in the lithotomy position.  Breasts:    Right: No mass, skin change or tenderness.     Left: No mass, skin change or tenderness.  HENT:     Head: Normocephalic and atraumatic. No laceration.     Right Ear: Hearing normal.     Left Ear: Hearing normal.     Mouth/Throat:     Pharynx: Uvula midline.  Eyes:     Pupils: Pupils are equal, round, and reactive to light.  Neck:     Thyroid: No thyromegaly.  Cardiovascular:     Rate and Rhythm: Normal rate and regular rhythm.     Heart sounds: No murmur heard.   No friction rub. No gallop.  Pulmonary:     Effort: Pulmonary effort is normal. No respiratory distress.     Breath sounds: Normal breath sounds. No wheezing.  Abdominal:     General: Bowel sounds are  normal. There is no distension.     Palpations: Abdomen is soft.     Tenderness: There is no abdominal tenderness. There is no rebound.  Musculoskeletal:        General: Normal range of motion.     Cervical back: Normal range of motion and neck supple.  Neurological:     Mental Status: She is alert and oriented to person, place, and time.     Cranial Nerves: No cranial nerve deficit.  Skin:  General: Skin is warm and dry.  Psychiatric:        Judgment: Judgment normal.  Vitals reviewed.    Assessment:  ANNUAL EXAM 1. Women's annual routine gynecological examination   2. Screening for cervical cancer   3. Obesity, Class III, BMI 40-49.9 (morbid obesity) (HCC)   4. Encounter for surveillance of contraceptive pills      Screening Plan:            1.  Cervical Screening-  Pap smear done today  2. Breast screening- Exam annually and mammogram>40 planned   3. Colonoscopy every 10 years, Hemoccult testing - after age 17  4. Labs managed by PCP  5. Counseling for contraception: oral contraceptives (estrogen/progesterone)  Upstream - 06/05/21 1402       Pregnancy Intention Screening   Does the patient want to become pregnant in the next year? No    Does the patient's partner want to become pregnant in the next year? No    Would the patient like to discuss contraceptive options today? No      Contraception Wrap Up   Current Method Oral Contraceptive            The pregnancy intention screening data noted above was reviewed. Potential methods of contraception were discussed. The patient elected to proceed with Oral Contraceptive.   6. Obesity, Class III, BMI 40-49.9 (morbid obesity) (HCC) - Discussed diet, exercise, and medical therapy - phentermine (ADIPEX-P) 37.5 MG tablet; One tablet po in morning.  Dispense: 30 tablet; Refill: 0     F/U  Return in about 4 weeks (around 07/03/2021) for Follow up.  Annamarie Major, MD, [facility], [facility] 06/05/2021  2:33 PM

## 2021-06-08 ENCOUNTER — Telehealth: Payer: Self-pay

## 2021-06-08 NOTE — Telephone Encounter (Signed)
Prior Approval received for LAMOTRIGINE ER 250 MG #30, effective 05/29/2021-05/29/2022, PA# [id number] [facility]

## 2021-06-11 ENCOUNTER — Ambulatory Visit: Payer: 59 | Admitting: Adult Health

## 2021-06-11 LAB — CYTOLOGY - PAP
Adequacy: ABSENT
Diagnosis: NEGATIVE

## 2021-06-12 ENCOUNTER — Ambulatory Visit (INDEPENDENT_AMBULATORY_CARE_PROVIDER_SITE_OTHER): Payer: 59 | Admitting: Adult Health

## 2021-06-12 ENCOUNTER — Encounter: Payer: Self-pay | Admitting: Adult Health

## 2021-06-12 ENCOUNTER — Other Ambulatory Visit: Payer: Self-pay

## 2021-06-12 DIAGNOSIS — F3181 Bipolar II disorder: Secondary | ICD-10-CM | POA: Diagnosis not present

## 2021-06-12 DIAGNOSIS — F4010 Social phobia, unspecified: Secondary | ICD-10-CM

## 2021-06-12 DIAGNOSIS — G4700 Insomnia, unspecified: Secondary | ICD-10-CM

## 2021-06-12 DIAGNOSIS — F909 Attention-deficit hyperactivity disorder, unspecified type: Secondary | ICD-10-CM | POA: Diagnosis not present

## 2021-06-12 DIAGNOSIS — F411 Generalized anxiety disorder: Secondary | ICD-10-CM | POA: Diagnosis not present

## 2021-06-12 MED ORDER — LISDEXAMFETAMINE DIMESYLATE 30 MG PO CAPS: 30.0000 mg | ORAL_CAPSULE | Freq: Every day | ORAL | 0 refills | Status: DC

## 2021-06-12 NOTE — Progress Notes (Signed)
Frances Lamb [id number] 1996/03/09 25 y.o.  Subjective:   Patient ID:  Frances Lamb is a 25 y.o. (DOB 1996/10/08) female.  Chief Complaint: No chief complaint on file.   HPI Frances Lamb presents to the office today for follow-up of Sad, GAD, ADHD, NPD2, and insomnia.  Describes mood today as "ok". Pleasant. Mood symptoms - denies depression, anxiety, and irritability. Decreased worry and rumination - "not wanting to constantly clean". Denies any recent panic attacks. Denies mood swings. Struggling with loss of libido plans to see GYN. Stating "I don't have any drive".  Stable interest and motivation. Taking medications as prescribed.  Energy levels depends - feels like she is always tired. Active, does not have a regular exercise routine.  Enjoys some usual interests and activities. Married - 3 years. Lives with husband and 76 month old son. Spending time with family. Appetite decreased with Vyvanse. Weight stable - 249 to 253 pounds.  Sleeping well most nights. Averages 8 to 12 hours with Seroquel. Focus and concentration improved. Restarted Vyvanse and feels it is working well. Managing aspects of household. 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  Denies AH or VH.  Previous medication trials: Lamictal, Cymbalta, Latuda, Hydroxyzine, Adderall, Vyvanse, Seroquel   PHQ2-9    Flowsheet Row Nutrition from 10/10/2019 in [facility] [place]  PHQ-2 Total Score 2  PHQ-9 Total Score 11      Flowsheet Row Admission (Discharged) from 05/21/2021 in [facility] ED from 03/15/2021 in [facility]  C-SSRS RISK CATEGORY No Risk No Risk        Review of Systems:  Review of Systems  Musculoskeletal:  Negative for gait problem.  Neurological:  Negative for tremors.  Psychiatric/Behavioral:         Please refer to HPI   Medications: I have reviewed the patient's current medications.  Current Outpatient  Medications  Medication Sig Dispense Refill   albuterol (PROVENTIL) (2.5 MG/3ML) 0.083% nebulizer solution Inhale into the lungs.     albuterol (VENTOLIN HFA) 108 (90 Base) MCG/ACT inhaler SMARTSIG:2 Inhalation Via Inhaler Every 4 Hours PRN     aspirin EC 325 MG tablet Take 1 tablet (325 mg total) by mouth 2 (two) times daily at 8 am and 10 pm. 84 tablet 0   DULoxetine (CYMBALTA) 60 MG capsule Take 1 capsule (60 mg total) by mouth daily. 30 capsule 5   LamoTRIgine 250 MG TB24 24 hour tablet Take 1 tablet by mouth daily. 30 tablet 5   lisdexamfetamine (VYVANSE) 30 MG capsule Take 1 capsule (30 mg total) by mouth daily. 30 capsule 0   norethindrone-ethinyl estradiol-FE (LOESTRIN FE) 1-20 MG-MCG tablet Take 1 tablet by mouth daily. 84 tablet 3   ondansetron (ZOFRAN) 4 MG tablet Take 1 tablet (4 mg total) by mouth every 8 (eight) hours as needed for nausea or vomiting. 20 tablet 0   phentermine (ADIPEX-P) 37.5 MG tablet One tablet po in morning. 30 tablet 0   QUEtiapine (SEROQUEL) 200 MG tablet Take 1 tablet (200 mg total) by mouth at bedtime. 30 tablet 5   No current facility-administered medications for this visit.    Medication Side Effects: None  Allergies: No Known Allergies  Past Medical History:  Diagnosis Date   ADHD (attention deficit hyperactivity disorder)    Anxiety    Bipolar 2 disorder (HCC)    Depression    Gestational diabetes    Hypertension  Migraine    Miscarriage     Past Medical History, Surgical history, Social history, and Family history were reviewed and updated as appropriate.   Please see review of systems for further details on the patient's review from today.   Objective:   Physical Exam:  There were no vitals taken for this visit.  Physical Exam Constitutional:      General: She is not in acute distress. Musculoskeletal:        General: No deformity.  Skin:    Findings: Bruising present.  Neurological:     Mental Status: She is alert and  oriented to person, place, and time.     Coordination: Coordination normal.  Psychiatric:        Attention and Perception: Attention and perception normal. She does not perceive auditory or visual hallucinations.        Mood and Affect: Mood normal. Mood is not anxious or depressed. Affect is not labile, blunt, angry or inappropriate.        Speech: Speech normal.        Behavior: Behavior normal.        Thought Content: Thought content normal. Thought content is not paranoid or delusional. Thought content does not include homicidal or suicidal ideation. Thought content does not include homicidal or suicidal plan.        Cognition and Memory: Cognition and memory normal.        Judgment: Judgment normal.     Comments: Insight intact    Lab Review:     Component Value Date/Time   NA 137 05/02/2020 1914   NA 138 09/16/2013 2333   K 4.2 05/02/2020 1914   K 3.2 (L) 09/16/2013 2333   CL 102 05/02/2020 1914   CL 109 (H) 09/16/2013 2333   CO2 24 05/02/2020 1914   CO2 22 09/16/2013 2333   GLUCOSE 106 (H) 05/02/2020 1914   GLUCOSE 153 (H) 09/16/2013 2333   BUN 13 05/02/2020 1914   BUN 7 09/16/2013 2333   CREATININE 0.69 05/02/2020 1914   CREATININE 0.66 09/16/2013 2333   CALCIUM 9.3 05/02/2020 1914   CALCIUM 8.8 (L) 09/16/2013 2333   PROT 7.8 05/02/2020 1914   ALBUMIN 4.0 05/02/2020 1914   AST 22 05/02/2020 1914   ALT 17 05/02/2020 1914   ALKPHOS 104 05/02/2020 1914   BILITOT 0.6 05/02/2020 1914   GFRNONAA >60 05/02/2020 1914   GFRNONAA >60 09/16/2013 2333   GFRAA >60 05/02/2020 1914   GFRAA >60 09/16/2013 2333       Component Value Date/Time   WBC 11.0 (H) 05/02/2020 1914   RBC 5.05 05/02/2020 1914   HGB 12.8 05/02/2020 1914   HGB 11.1 08/27/2019 1048   HCT 39.0 05/02/2020 1914   HCT 34.0 08/27/2019 1048   PLT 385 05/02/2020 1914   PLT 276 08/27/2019 1048   MCV 77.2 (L) 05/02/2020 1914   MCV 86 08/27/2019 1048   MCV 84 09/16/2013 2333   MCH 25.3 (L) 05/02/2020 1914    MCHC 32.8 05/02/2020 1914   RDW 15.1 05/02/2020 1914   RDW 13.2 08/27/2019 1048   RDW 13.5 09/16/2013 2333   LYMPHSABS 2.6 08/27/2019 1048   MONOABS 0.8 03/13/2019 0425   EOSABS 0.1 08/27/2019 1048   BASOSABS 0.0 08/27/2019 1048    No results found for: POCLITH, LITHIUM   No results found for: PHENYTOIN, PHENOBARB, VALPROATE, CBMZ   .res Assessment: Plan:    Plan:  PDMP reviewed  1. Lamictal 250mg  daily 2. Decrease  Cymbalta 60mg  daily 3. Seroquel 200mg  at bedtime 4. Add Vyvanse 30mg  every morning  124/79 - 81   RTC 4 weeks   Time spent with patient was 60 minutes. Greater than 50% of face to face time with patient was spent on counseling and coordination of care.     Patient advised to contact office with any questions, adverse effects, or acute worsening in signs and symptoms.   Discussed potential benefits, risks, and side effects of stimulants with patient to include increased heart rate, palpitations, insomnia, increased anxiety, increased irritability, or decreased appetite.  Instructed patient to contact office if experiencing any significant tolerability issues.   Counseled patient regarding potential benefits, risks, and side effects of Lamictal to include potential risk of Stevens-Johnson syndrome. Advised patient to stop taking Lamictal and contact office immediately if rash develops and to seek urgent medical attention if rash is severe and/or spreading quickly.     Diagnoses and all orders for this visit:  Insomnia, unspecified type  Bipolar II disorder (HCC)  Attention deficit hyperactivity disorder (ADHD), unspecified ADHD type  Generalized anxiety disorder  Social anxiety disorder    Please see After Visit Summary for patient specific instructions.  Future Appointments  Date Time Provider Department Center  07/09/2021  2:10 PM , MD [facility] None    No orders of the defined types were placed in this  encounter.   -------------------------------

## 2021-06-25 ENCOUNTER — Other Ambulatory Visit: Payer: Self-pay | Admitting: Obstetrics & Gynecology

## 2021-06-30 ENCOUNTER — Ambulatory Visit: Payer: 59 | Attending: Podiatry

## 2021-06-30 ENCOUNTER — Other Ambulatory Visit: Payer: Self-pay

## 2021-06-30 VITALS — BP 120/85 | HR 85 | Resp 17 | Ht 62.0 in | Wt 249.0 lb

## 2021-06-30 DIAGNOSIS — R2681 Unsteadiness on feet: Secondary | ICD-10-CM | POA: Diagnosis present

## 2021-06-30 DIAGNOSIS — R262 Difficulty in walking, not elsewhere classified: Secondary | ICD-10-CM | POA: Diagnosis present

## 2021-06-30 DIAGNOSIS — M6281 Muscle weakness (generalized): Secondary | ICD-10-CM

## 2021-06-30 DIAGNOSIS — R278 Other lack of coordination: Secondary | ICD-10-CM | POA: Insufficient documentation

## 2021-06-30 DIAGNOSIS — R269 Unspecified abnormalities of gait and mobility: Secondary | ICD-10-CM | POA: Diagnosis present

## 2021-06-30 NOTE — Therapy (Signed)
[facility] MAIN [facility] SERVICES [address] [place], [place], [address] Phone: [phone]   Fax:  [phone]  Physical Therapy Evaluation  Patient Details  Name: Frances Lamb MRN: [id number] Date of Birth: Jan 31, 1996 Referring Provider (PT): Dr. Rosetta Posner   Encounter Date: 06/30/2021   PT End of Session - 06/30/21 1716     Visit Number 1    Number of Visits 25    Date for PT Re-Evaluation 09/22/21    Authorization Time Period Initial Certification = 06/30/2021- 09/22/2021    PT Start Time 1100    PT Stop Time 1159    PT Time Calculation (min) 59 min    Equipment Utilized During Treatment Gait belt;Other (comment)   Left boot   Activity Tolerance Patient tolerated treatment well    Behavior During Therapy WFL for tasks assessed/performed             Past Medical History:  Diagnosis Date   ADHD (attention deficit hyperactivity disorder)    Anxiety    Bipolar 2 disorder (HCC)    Depression    Gestational diabetes    Hypertension    Migraine    Miscarriage     Past Surgical History:  Procedure Laterality Date   ANKLE ARTHROSCOPY Left 05/21/2021   Procedure: ANKLE ARTHROSCOPY- OCD Repair, Debridement; Extensive;  Surgeon: Rosetta Posner, DPM;  Location: [facility];  Service: Podiatry;  Laterality: Left;   ANKLE RECONSTRUCTION Left 05/21/2021   Procedure: RECONSTRUCTION ANKLE- Brostrum-Gould x2;  Surgeon: Rosetta Posner, DPM;  Location: [facility];  Service: Podiatry;  Laterality: Left;  Choice, pre-op pop/saph block   CESAREAN SECTION N/A 10/28/2019   Procedure: CESAREAN SECTION;  Surgeon: Nadara Mustard, MD;  Location: [facility] OR[facility];  Service: Obstetrics;  Laterality: N/A;   TONSILLECTOMY AND ADENOIDECTOMY      Vitals:   06/30/21 1116  BP: 120/85  Pulse: 85  Resp: 17  Weight: 249 lb (112.9 kg)  Height: 5\' 2"  (1.575 m)      Subjective Assessment - 06/30/21 1118     Subjective Patient reports having  surgery in late June for unstable ankle and tendon repair    Pertinent History PRE-OPERATIVE DIAGNOSIS: All left foot/ankle  1.  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  Bone marrow edema talus  3.  Complete rupture of the ATFL ankle ligament with lateral ankle instability  4.  Complete tear of deep deltoid ligament with minor tearing of the superficial deltoid medial ankle with medial ankle instability     POST-OPERATIVE DIAGNOSIS: Same     PROCEDURE(S): All left foot and ankle  1.  Ankle arthroscopy with extensive debridement  2.  Subchondroplasty talus  3.  Osteochondral defect microfracture repair of talus  4.  Brostrm Gould with internal brace lateral ankle stabilization  5.  Deltoid ligament repair medial ankle    Limitations Standing;House hold activities;Walking;Lifting    How long can you sit comfortably? No limitations    How long can you stand comfortably? 5 min    How long can you walk comfortably? 2-5 min    Patient Stated Goals I want to improve my ankle stability in both ankles and strengthen them as much as I can.    Currently in Pain? Yes    Pain Score 2     Pain Location Ankle    Pain Descriptors / Indicators Aching    Pain Type Chronic pain  Pain Onset More than a month ago    Pain Frequency Constant    Aggravating Factors  active mobility    Pain Relieving Factors Rest    Effect of Pain on Daily Activities Difficulty with all mobility                [facility] PT Assessment - 06/30/21 1115       Assessment   Medical Diagnosis s/p Left ankle arthroscopy/recontruction    Referring Provider (PT) Dr. Rosetta Posner    Onset Date/Surgical Date 05/21/21    Hand Dominance Right    Prior Therapy no      Precautions   Precautions Other (comment)   See therapy note for details of MD restrictions- Currently 50% left LE weightbearing   Required Braces or Orthoses --   walking boot     Restrictions   Weight Bearing Restrictions Yes    LLE  Weight Bearing Partial weight bearing      Balance Screen   Has the patient fallen in the past 6 months Yes    How many times? 1    Has the patient had a decrease in activity level because of a fear of falling?  Yes    Is the patient reluctant to leave their home because of a fear of falling?  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     Cognition   Overall Cognitive Status Within Functional Limits for tasks assessed    Attention Focused    Focused Attention Appears intact    Awareness Appears intact    Problem Solving Appears intact    Executive Function Reasoning;Sequencing;Organizing;Decision Making;Self Monitoring;Initiating;Self Correcting    Reasoning Appears intact    Sequencing Appears intact    Organizing Appears intact    Decision Making Appears intact    Initiating Appears intact    Self Monitoring Appears intact    Self Correcting Appears intact      Observation/Other Assessments   Skin Integrity 2 scars med/later left ankle from recent surgery    Focus on Therapeutic Outcomes (FOTO)  34                OBJECTIVE  MUSCULOSKELETAL: Tremor: Absent Bulk: Normal Tone: Normal, no clonus No trophic changes noted to foot/ankle. No ecchymosis, erythema, or edema noted. No gross ankle/foot deformity noted  Gait- Patient ambulated approx 80 feet in clinic with axillary crutches exhibiting step gait with boot on left and instructed in 50% LLE PWB- patient able to return demonstration well without significant difficulty or  report of pain.    Palpation No pain to palpation along medial and lateral malleoli. No pain over anterior or posterior ankle. Achilles tendon intact and painless to palpation  Strength R/L 5/4 Hip flexion 5/4 Hip external rotation 5/4 Hip internal rotation 5/4 Hip extension  5/4 Hip abduction 5/4 Hip adduction 5/4 Knee extension 5/4 Knee flexion 5/2-Ankle Plantarflexion 5/2- Ankle  Dorsiflexion 5/NT (due to MD restriction) Ankle Inversion 5/NT (due to MD restriction)  Ankle Eversion   AROM R/L WNL/22 Ankle Plantarflexion WNL/lacking 15 deg from neutral- Ankle Dorsiflexion WNL/NT Ankle Inversion WNL/NT Ankle Eversion  Sensation Grossly intact to light touch bilateral LEs as determined by testing dermatomes L2-S2 Proprioception and hot/cold testing deferred on this date  VASCULAR Dorsalis pedis and posterior tibial pulses are palpable      ASSESSMENT Pt is a pleasant 25 year-old female referred for post surgical rehab following left ankle arthroscopy/debridement/reconstruction. 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  PT examination reveals deficits in ROM, LE muscle weakness and difficulty with walking/impaired balance. Pt will benefit from PT services to address deficits in ROM, strength, mobility, and pain in order to return to full function at home with less ankle/foot pain and instability.             Per note submitted by Dr. Rosetta Posner on 06/16/2021 regarding precautions/restrictions:   At this point you may remove the boot to work on gentle dorsiflexion and plantarflexion exercises of the ankle. Please still refrain from performing inversion or eversion exercises. Perform this multiple times a day. You may remove the boot to shower or when at rest. Still need to be wearing the boot when sleeping as will be acting as a splint for the foot and ankle.  Recommend rest, ice, compression,  elevation use of Tylenol or NSAIDs for relief as needed.  After the next 2 weeks then follow weightbearing instructions that are shown below. For now for the next 2 weeks continue nonweightbearing at all times.  You may begin weightbearing at this point for short distances. Recommend for the first week walking with 50% of weight on your foot with use of boot at all times when ambulating and use of a walker/crutches. After 1 week then recommend walking with 75% of weight on your foot with use of the boot at all times when ambulating and use walker/crutches. By the third week from today recommend walking with 100% of weight on your foot with use of walker/crutches as needed in boot. By week 4 recommend walking with 100% of weight on your foot with boot without other aid. Then transition to ASO ankle brace around week 10 postop and back into normal shoe       Objective measurements completed on examination: See above findings.               PT Education - 06/30/21 1716     Education Details PT plan of care    Person(s) Educated Patient    Methods Explanation;Demonstration;Verbal cues    Comprehension Verbalized understanding;Need further instruction              PT Short Term Goals - 06/30/21 1721       PT SHORT TERM GOAL #1   Title Pt will be independent with HEP in order to decrease ankle pain and increase strength in order to improve pain-free function at home and work.    Baseline 06/30/2021= Patient has no HEP in place.    Time 6    Period Weeks    Status New    Target Date 08/11/21               PT Long Term Goals - 06/30/21 1722       PT LONG TERM GOAL #1   Title Pt will improve FOTO to  target score of 56 to display perceived improvements in ability to complete ADL's.    Baseline 06/30/2021= 34    Time 12    Period Weeks    Status New    Target Date 09/22/21      PT LONG TERM GOAL #2   Title Pt will increase LEFS by at least 9 points in order to  demonstrate significant improvement in lower extremity function.    Baseline 8/2/022= 19/80    Time 12    Period Weeks    Status New    Target Date 09/22/21      PT LONG TERM GOAL #3   Title Pt will increase strength of  left ankle DF/PF by at least 1/2 MMT grade in order to demonstrate improvement in strength and function    Baseline 06/30/2021= 2-/5 left ankle DF/PF    Time 12    Period Weeks    Status New    Target Date 09/22/21      PT LONG TERM GOAL #4   Title Patient will demonstrate improved left ankle AROM DF/PF by 50% for improved overall mobility with gait including heel strike and toe off    Baseline 06/30/2021= DF= lacking 15 deg from neutral; PF= 22 deg    Time 12    Period Weeks    Status New    Target Date 09/22/21      PT LONG TERM GOAL #5   Title Pt will decrease TUG to below 14 seconds/decrease in order to demonstrate decreased fall risk.    Baseline 06/30/2021= TUG= 27.86 sec using crutches and 50% LLE PWB    Time 12    Period Weeks    Status New    Target Date 09/22/21      Additional Long Term Goals   Additional Long Term Goals Yes      PT LONG TERM GOAL #6   Title Pt will increase 10MWT by at least 0.5 m/s in order to demonstrate clinically significant improvement in community ambulation.    Baseline 06/30/2021= 0.27 m/s    Time 12    Period Weeks    Status New    Target Date 09/22/21                    Plan - 06/30/21 1718     Clinical Impression Statement Pt is a pleasant 25 year-old female referred for post surgical rehab following left ankle arthroscopy/debridement/reconstruction. 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  PT examination reveals deficits in ROM, LE muscle weakness and difficulty with walking/impaired balance. Pt will benefit from PT services to address deficits in ROM, strength, mobility, and pain in order to return to full function at home with less  ankle/foot pain and instability.    Examination-Activity Limitations Bend;Caring for Others;Carry;Dressing;Lift;Locomotion Level;Squat;Stairs;Stand;Transfers    Examination-Participation Restrictions Cleaning;Community Activity;Laundry;Yard Work    Stability/Clinical Decision Making Stable/Uncomplicated    OptometristClinical Decision Making High    PT Frequency 2x / week    PT Duration 12 weeks    PT Treatment/Interventions ADLs/Self Care Home Management;Cryotherapy;Moist Heat;DME Instruction;Gait training;Stair training;Functional mobility training;Therapeutic activities;Therapeutic exercise;Balance training;Neuromuscular re-education;Patient/family education;Manual techniques;Scar mobilization;Passive range of motion;Dry needling    PT Next Visit Plan Instruct in ankle ROM/strengthening and add to HEP    PT Home Exercise Plan will initiate next visit    Consulted and Agree with Plan of Care Patient  Patient will benefit from skilled therapeutic intervention in order to improve the following deficits and impairments:  Abnormal gait, Decreased activity tolerance, Decreased balance, Decreased coordination, Decreased endurance, Decreased range of motion, Decreased strength, Difficulty walking, Pain  Visit Diagnosis: Abnormality of gait and mobility  Difficulty in walking, not elsewhere classified  Muscle weakness (generalized)  Other lack of coordination     Problem List Patient Active Problem List   Diagnosis Date Noted   Bipolar disorder, in full remission, most recent episode mixed (HCC) 11/11/2020   Sexual dysfunction 10/14/2020   Bipolar disorder, in full remission, most recent episode depressed (HCC) 07/07/2020   Insomnia due to mental disorder 03/04/2020   High risk medication use 02/05/2020   Bipolar 2 disorder, major depressive episode (HCC) 01/10/2020   Social anxiety disorder 01/10/2020   S/P cesarean section 11/14/2019   Delivery by cesarean section using  transverse incision of lower segment of uterus 10/28/2019   Bipolar 2 disorder (HCC) 03/14/2019   Obesity, Class III, BMI 40-49.9 (morbid obesity) (HCC) 03/14/2019   GAD (generalized anxiety disorder) 04/18/2018    Lenda Kelp, PT 07/01/2021, 8:07 AM  [facility] MAIN [facility] SERVICES [address] [place], [place], [address] Phone: [phone]   Fax:  [phone]  Name: Frances Lamb MRN: [id number] Date of Birth: [birth year]

## 2021-07-08 ENCOUNTER — Ambulatory Visit: Payer: 59

## 2021-07-09 ENCOUNTER — Ambulatory Visit: Payer: 59 | Admitting: Obstetrics & Gynecology

## 2021-07-09 ENCOUNTER — Ambulatory Visit: Payer: 59

## 2021-07-13 ENCOUNTER — Ambulatory Visit: Payer: 59

## 2021-07-13 ENCOUNTER — Other Ambulatory Visit: Payer: Self-pay

## 2021-07-13 ENCOUNTER — Ambulatory Visit: Payer: 59 | Admitting: Adult Health

## 2021-07-13 DIAGNOSIS — R2681 Unsteadiness on feet: Secondary | ICD-10-CM

## 2021-07-13 DIAGNOSIS — M6281 Muscle weakness (generalized): Secondary | ICD-10-CM

## 2021-07-13 DIAGNOSIS — R269 Unspecified abnormalities of gait and mobility: Secondary | ICD-10-CM

## 2021-07-13 DIAGNOSIS — R262 Difficulty in walking, not elsewhere classified: Secondary | ICD-10-CM
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
## 2021-07-14 ENCOUNTER — Ambulatory Visit: Payer: 59

## 2021-07-15 ENCOUNTER — Other Ambulatory Visit: Payer: Self-pay | Admitting: Psychiatry

## 2021-07-15 DIAGNOSIS — F411 Generalized anxiety disorder: Secondary | ICD-10-CM

## 2021-07-22 ENCOUNTER — Other Ambulatory Visit: Payer: Self-pay

## 2021-07-22 ENCOUNTER — Ambulatory Visit (INDEPENDENT_AMBULATORY_CARE_PROVIDER_SITE_OTHER): Payer: 59 | Admitting: Obstetrics & Gynecology

## 2021-07-22 ENCOUNTER — Encounter: Payer: Self-pay | Admitting: Obstetrics & Gynecology

## 2021-07-22 ENCOUNTER — Ambulatory Visit: Payer: 59

## 2021-07-22 VITALS — BP 120/80 | Ht 62.0 in | Wt 252.0 lb

## 2021-07-22 DIAGNOSIS — M6281 Muscle weakness (generalized): Secondary | ICD-10-CM

## 2021-07-22 DIAGNOSIS — R262 Difficulty in walking, not elsewhere classified: Secondary | ICD-10-CM

## 2021-07-22 DIAGNOSIS — R269 Unspecified abnormalities of gait and mobility: Secondary | ICD-10-CM

## 2021-07-22 MED ORDER — PHENTERMINE HCL 37.5 MG PO TABS: ORAL_TABLET | ORAL | 1 refills | Status: DC

## 2021-07-22 MED ORDER — TOPIRAMATE 25 MG PO TABS: 25.0000 mg | ORAL_TABLET | Freq: Two times a day (BID) | ORAL | 6 refills | Status: DC
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
## 2021-07-22 NOTE — Progress Notes (Signed)
  History of Present Illness:  Frances Lamb is a 25 y.o. who was started on Phentermine approximately 6 weeks ago due to obesity/abnormal weight gain.  She took for a while, then stopped due to ankle surgery and rehab.  Just now getting back to where she can exercise. The patient reports no significant weight change..   She has these side effects: none.  PMHx: She  has a past medical history of ADHD (attention deficit hyperactivity disorder), Anxiety, Bipolar 2 disorder (HCC), Depression, Gestational diabetes, Hypertension, Migraine, and Miscarriage. Also,  has a past surgical history that includes Tonsillectomy and adenoidectomy; Cesarean section (N/A, 10/28/2019); Ankle reconstruction (Left, 05/21/2021); and Ankle arthroscopy (Left, 05/21/2021)., family history includes Anxiety disorder in her mother; Bipolar disorder in her mother; Depression in her mother; Diabetes in her maternal grandfather and mother; OCD in her mother.,  reports that she quit smoking about 8 years ago. Her smoking use included cigarettes. She has never used smokeless tobacco. She reports current alcohol use of about 4.0 standard drinks per week. She reports that she does not use drugs.  She has a current medication list which includes the following prescription(s): albuterol, duloxetine, lamotrigine, lisdexamfetamine, norgestimate-ethinyl estradiol, ondansetron, quetiapine, topiramate, albuterol, and phentermine. Also, is allergic to cephalexin.  Review of Systems  All other systems reviewed and are negative.  Physical Exam:  BP 120/80   Ht 5\' 2"  (1.575 m)   Wt 252 lb (114.3 kg)   BMI 46.09 kg/m  Body mass index is 46.09 kg/m. Filed Weights   07/22/21 1401  Weight: 252 lb (114.3 kg)    Physical Exam Constitutional:      General: She is not in acute distress.    Appearance: She is well-developed.  Musculoskeletal:        General: Normal range of motion.  Neurological:     Mental Status: She is alert and oriented to  person, place, and time.  Skin:    General: Skin is warm and dry.  Vitals reviewed.    Assessment: morbid obesity Medication treatment is going marginally for her. She is considering a change in her bipolar meds that may have effects on her capability for weight loss. She will cont OCPs.  Plan: Patient is continued/added to prescription appetite suppressants: Phentermine; and also will add in Topamax .   Will continue to assist patient in incorporating positive experiences into her life to promote a positive mental attitude.  Education given regarding appropriate lifestyle changes for weight loss, including regular physical activity, healthy coping strategies, caloric restriction, and healthy eating patterns.  The risks and benefits as well as side effects of medication, such as Phenteramine or Tenuate, is discussed.  The pros and cons of suppressing appetite and boosting metabolism is counseled.  Risks of tolerance and addiction discussed.  Use of medicine will be short term.  Pt to call with any negative side effects and agrees to keep follow up appointments.  A total of 20 minutes were spent face-to-face with the patient as well as preparation, review, communication, and documentation during this encounter.   07/24/21, MD, Annam[facility] Major Ob/G[facility], [facility] 07/22/2021  2:27 PM

## 2021-07-23 ENCOUNTER — Ambulatory Visit: Payer: 59 | Admitting: Physical Therapy

## 2021-07-23 DIAGNOSIS — R2681 Unsteadiness on feet: Secondary | ICD-10-CM

## 2021-07-23 DIAGNOSIS — R269 Unspecified abnormalities of gait and mobility: Secondary | ICD-10-CM

## 2021-07-23 DIAGNOSIS — M6281 Muscle weakness (generalized): Secondary | ICD-10-CM

## 2021-07-23 DIAGNOSIS — R262 Difficulty in walking, not elsewhere classified: Secondary | ICD-10-CM

## 2021-07-23 DIAGNOSIS — R278 Other lack of coordination: Secondary | ICD-10-CM
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
## 2021-07-24 ENCOUNTER — Other Ambulatory Visit: Payer: Self-pay | Admitting: Obstetrics & Gynecology

## 2021-07-27 ENCOUNTER — Ambulatory Visit: Payer: 59 | Admitting: Adult Health

## 2021-07-27 ENCOUNTER — Ambulatory Visit: Payer: 59

## 2021-07-27 ENCOUNTER — Telehealth: Payer: Self-pay | Admitting: Adult Health
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
## 2021-07-28 ENCOUNTER — Other Ambulatory Visit: Payer: Self-pay | Admitting: Obstetrics & Gynecology

## 2021-07-28 NOTE — Telephone Encounter (Signed)
Left message to call back and discuss.

## 2021-07-28 NOTE — Telephone Encounter (Signed)
Why was she r/s today?

## 2021-07-28 NOTE — Telephone Encounter (Signed)
The next appt is on 9/13.She is still currently taking all meds she is just saying they are not affective anymore and her appt was yesterday but she was rescheduled

## 2021-07-28 NOTE — Telephone Encounter (Signed)
When did she stop her medications and why - she reported feeling well at last appointment in July. When is next appointment?

## 2021-07-29 ENCOUNTER — Telehealth: Payer: Self-pay | Admitting: Adult Health

## 2021-07-29 ENCOUNTER — Ambulatory Visit: Payer: 59

## 2021-07-29 DIAGNOSIS — F3181 Bipolar II disorder: Secondary | ICD-10-CM

## 2021-07-29 MED ORDER — ARIPIPRAZOLE 5 MG PO TABS: ORAL_TABLET | ORAL | 2 refills | Status: DC

## 2021-07-29 NOTE — Telephone Encounter (Signed)
Called and LVM for patient to return my call. °

## 2021-07-29 NOTE — Telephone Encounter (Signed)
error 

## 2021-07-29 NOTE — Telephone Encounter (Signed)
Called and spoke with patient. Feels irritable, has a short fuse, and outbursts. Talked to mother and husband to help her identify issues. Feels like she's been having issues for a while. Does not feel like current medications are working well for her. Addition of Vyvanse helpful. Wanting to be in a better mood. Making some impulsive decisions.  Will titrate down to Seroquel 50mg  at bedtime - decreased by 50mg  every 3 days. Add Abilify 5mg  daily - 1/2 tablet daily x 7 days, then one tablet daily.

## 2021-08-05 ENCOUNTER — Ambulatory Visit: Payer: 59 | Attending: Podiatry

## 2021-08-05 ENCOUNTER — Other Ambulatory Visit: Payer: Self-pay

## 2021-08-05 DIAGNOSIS — R269 Unspecified abnormalities of gait and mobility: Secondary | ICD-10-CM | POA: Insufficient documentation

## 2021-08-05 DIAGNOSIS — R262 Difficulty in walking, not elsewhere classified: Secondary | ICD-10-CM | POA: Diagnosis present

## 2021-08-05 DIAGNOSIS — M6281 Muscle weakness (generalized): Secondary | ICD-10-CM | POA: Diagnosis present

## 2021-08-05 DIAGNOSIS — R2681 Unsteadiness on feet: Secondary | ICD-10-CM

## 2021-08-05 DIAGNOSIS — R278 Other lack of coordination: Secondary | ICD-10-CM | POA: Diagnosis present
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
## 2021-08-06 ENCOUNTER — Ambulatory Visit: Payer: 59

## 2021-08-11 ENCOUNTER — Ambulatory Visit (INDEPENDENT_AMBULATORY_CARE_PROVIDER_SITE_OTHER): Payer: 59 | Admitting: Adult Health

## 2021-08-11 ENCOUNTER — Other Ambulatory Visit: Payer: Self-pay

## 2021-08-11 ENCOUNTER — Encounter: Payer: Self-pay | Admitting: Adult Health

## 2021-08-11 DIAGNOSIS — F3181 Bipolar II disorder: Secondary | ICD-10-CM

## 2021-08-11 DIAGNOSIS — F909 Attention-deficit hyperactivity disorder, unspecified type: Secondary | ICD-10-CM

## 2021-08-11 DIAGNOSIS — G4700 Insomnia, unspecified: Secondary | ICD-10-CM | POA: Diagnosis not present

## 2021-08-11 DIAGNOSIS — F411 Generalized anxiety disorder: Secondary | ICD-10-CM

## 2021-08-11 DIAGNOSIS — F331 Major depressive disorder, recurrent, moderate: Secondary | ICD-10-CM

## 2021-08-11 MED ORDER — QUETIAPINE FUMARATE 200 MG PO TABS: 200.0000 mg | ORAL_TABLET | Freq: Every day | ORAL | 5 refills | Status: DC

## 2021-08-11 MED ORDER — DULOXETINE HCL 30 MG PO CPEP: 30.0000 mg | ORAL_CAPSULE | Freq: Every day | ORAL | 5 refills | Status: DC

## 2021-08-11 MED ORDER — LAMOTRIGINE ER 250 MG PO TB24: 1.0000 | ORAL_TABLET | Freq: Every day | ORAL | 5 refills | Status: DC

## 2021-08-11 MED ORDER — ARIPIPRAZOLE 5 MG PO TABS: ORAL_TABLET | ORAL | 2 refills | Status: DC

## 2021-08-11 MED ORDER — LISDEXAMFETAMINE DIMESYLATE 30 MG PO CAPS: 30.0000 mg | ORAL_CAPSULE | Freq: Every day | ORAL | 0 refills | Status: DC

## 2021-08-11 NOTE — Progress Notes (Signed)
Frances Lamb [id number] 05-10-1996 25 y.o.  Subjective:   Patient ID:  Frances Lamb is a 25 y.o. (DOB [birth year]) female.  Chief Complaint: No chief complaint on file.   HPI Frances Lamb presents to the office today for follow-up of SAD, GAD, ADHD, BPD2, and insomnia.  Describes mood today as "ok". Pleasant. Mood symptoms - denies depression, anxiety, and irritability. Decreased worry and rumination. Denies any recent panic attacks. Denies mood swings. Stating "I am doing better". Feels like current medication regimen is working well for her. Husband has also commented that she is doing "better".  Stable interest and motivation. Taking medications as prescribed.  Energy levels improved. Active, does not have a regular exercise routine.  Enjoys some usual interests and activities. Married - 3 years. Lives with husband and 26 month old son. Spending time with family Appetite decreased with Vyvanse. Weight hours - 252 pounds.  Sleeping well most nights. Averages 8 hours. Focus and concentration improved. Feels like Vyvanse is working well - getting more accomplished. Managing aspects of household. 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  Denies AH or VH.  Previous medication trials: Lamictal, Cymbalta, Latuda, Hydroxyzine, Adderall, Vyvanse, Seroquel   PHQ2-9    Flowsheet Row Nutrition from 10/10/2019 in [facility] [place]  PHQ-2 Total Score 2  PHQ-9 Total Score 11      Flowsheet Row Admission (Discharged) from 05/21/2021 in [facility] from 03/15/2021 in [facility]  C-SSRS RISK CATEGORY No Risk No Risk        Review of Systems:  Review of Systems  Musculoskeletal:  Negative for gait problem.  Neurological:  Negative for tremors.  Psychiatric/Behavioral:         Please refer to HPI   Medications: I have reviewed the patient's current medications.  Current Outpatient Medications   Medication Sig Dispense Refill   albuterol (PROVENTIL) (2.5 MG/3ML) 0.083% nebulizer solution Inhale into the lungs.     albuterol (VENTOLIN HFA) 108 (90 Base) MCG/ACT inhaler SMARTSIG:2 Inhalation Via Inhaler Every 4 Hours PRN     ARIPiprazole (ABILIFY) 5 MG tablet Take one tablet daily. 30 tablet 2   DULoxetine (CYMBALTA) 30 MG capsule Take 1 capsule (30 mg total) by mouth daily. 30 capsule 5   LamoTRIgine 250 MG TB24 24 hour tablet Take 1 tablet by mouth daily. 30 tablet 5   lisdexamfetamine (VYVANSE) 30 MG capsule Take 1 capsule (30 mg total) by mouth daily. 30 capsule 0   norgestimate-ethinyl estradiol (ORTHO-CYCLEN) 0.25-35 MG-MCG tablet TAKE 1 TABLET BY MOUTH EVERY DAY 84 tablet 1   ondansetron (ZOFRAN) 4 MG tablet Take 1 tablet (4 mg total) by mouth every 8 (eight) hours as needed for nausea or vomiting. 20 tablet 0   phentermine (ADIPEX-P) 37.5 MG tablet One tablet po in morning. 30 tablet 1   QUEtiapine (SEROQUEL) 200 MG tablet Take 1 tablet (200 mg total) by mouth at bedtime. 30 tablet 5   topiramate (TOPAMAX) 25 MG tablet Take 1 tablet (25 mg total) by mouth 2 (two) times daily. 60 tablet 6   No current facility-administered medications for this visit.    Medication Side Effects: None  Allergies:  Allergies  Allergen Reactions   Cephalexin Rash    Past Medical History:  Diagnosis Date   ADHD (attention deficit hyperactivity disorder)    Anxiety    Bipolar 2 disorder (HCC)    Depression    Gestational  diabetes    Hypertension    Migraine    Miscarriage     Past Medical History, Surgical history, Social history, and Family history were reviewed and updated as appropriate.   Please see review of systems for further details on the patient's review from today.   Objective:   Physical Exam:  There were no vitals taken for this visit.  Physical Exam Constitutional:      General: She is not in acute distress. Musculoskeletal:        General: No deformity.   Neurological:     Mental Status: She is alert and oriented to person, place, and time.     Coordination: Coordination normal.  Psychiatric:        Attention and Perception: Attention and perception normal. She does not perceive auditory or visual hallucinations.        Mood and Affect: Mood normal. Mood is not anxious or depressed. Affect is not labile, blunt, angry or inappropriate.        Speech: Speech normal.        Behavior: Behavior normal.        Thought Content: Thought content normal. Thought content is not paranoid or delusional. Thought content does not include homicidal or suicidal ideation. Thought content does not include homicidal or suicidal plan.        Cognition and Memory: Cognition and memory normal.        Judgment: Judgment normal.     Comments: Insight intact    Lab Review:     Component Value Date/Time   NA 137 05/02/2020 1914   NA 138 09/16/2013 2333   K 4.2 05/02/2020 1914   K 3.2 (L) 09/16/2013 2333   CL 102 05/02/2020 1914   CL 109 (H) 09/16/2013 2333   CO2 24 05/02/2020 1914   CO2 22 09/16/2013 2333   GLUCOSE 106 (H) 05/02/2020 1914   GLUCOSE 153 (H) 09/16/2013 2333   BUN 13 05/02/2020 1914   BUN 7 09/16/2013 2333   CREATININE 0.69 05/02/2020 1914   CREATININE 0.66 09/16/2013 2333   CALCIUM 9.3 05/02/2020 1914   CALCIUM 8.8 (L) 09/16/2013 2333   PROT 7.8 05/02/2020 1914   ALBUMIN 4.0 05/02/2020 1914   AST 22 05/02/2020 1914   ALT 17 05/02/2020 1914   ALKPHOS 104 05/02/2020 1914   BILITOT 0.6 05/02/2020 1914   GFRNONAA >60 05/02/2020 1914   GFRNONAA >60 09/16/2013 2333   GFRAA >60 05/02/2020 1914   GFRAA >60 09/16/2013 2333       Component Value Date/Time   WBC 11.0 (H) 05/02/2020 1914   RBC 5.05 05/02/2020 1914   HGB 12.8 05/02/2020 1914   HGB 11.1 08/27/2019 1048   HCT 39.0 05/02/2020 1914   HCT 34.0 08/27/2019 1048   PLT 385 05/02/2020 1914   PLT 276 08/27/2019 1048   MCV 77.2 (L) 05/02/2020 1914   MCV 86 08/27/2019 1048   MCV  84 09/16/2013 2333   MCH 25.3 (L) 05/02/2020 1914   MCHC 32.8 05/02/2020 1914   RDW 15.1 05/02/2020 1914   RDW 13.2 08/27/2019 1048   RDW 13.5 09/16/2013 2333   LYMPHSABS 2.6 08/27/2019 1048   MONOABS 0.8 03/13/2019 0425   EOSABS 0.1 08/27/2019 1048   BASOSABS 0.0 08/27/2019 1048    No results found for: POCLITH, LITHIUM   No results found for: PHENYTOIN, PHENOBARB, VALPROATE, CBMZ   .res Assessment: Plan:     Plan:  PDMP reviewed  1. Lamictal 250mg  daily 2.  Decrease Cymbalta 60mg  to 30mg  daily 3. Seroquel 100mg  at bedtime 4. Vyvanse 30mg  every morning 5. Abilify 5mg  daily  122/84 95  RTC 4 weeks  Set up with a therapist  Patient advised to contact office with any questions, adverse effects, or acute worsening in signs and symptoms.   Discussed potential benefits, risks, and side effects of stimulants with patient to include increased heart rate, palpitations, insomnia, increased anxiety, increased irritability, or decreased appetite.  Instructed patient to contact office if experiencing any significant tolerability issues.   Counseled patient regarding potential benefits, risks, and side effects of Lamictal to include potential risk of Stevens-Johnson syndrome. Advised patient to stop taking Lamictal and contact office immediately if rash develops and to seek urgent medical attention if rash is severe and/or spreading quickly.     Diagnoses and all orders for this visit:  Attention deficit hyperactivity disorder (ADHD), unspecified ADHD type -     lisdexamfetamine (VYVANSE) 30 MG capsule; Take 1 capsule (30 mg total) by mouth daily.  Insomnia, unspecified type -     QUEtiapine (SEROQUEL) 200 MG tablet; Take 1 tablet (200 mg total) by mouth at bedtime.  Bipolar II disorder (HCC) -     QUEtiapine (SEROQUEL) 200 MG tablet; Take 1 tablet (200 mg total) by mouth at bedtime. -     LamoTRIgine 250 MG TB24 24 hour tablet; Take 1 tablet by mouth daily. -      ARIPiprazole (ABILIFY) 5 MG tablet; Take one tablet daily.  Generalized anxiety disorder -     DULoxetine (CYMBALTA) 30 MG capsule; Take 1 capsule (30 mg total) by mouth daily.  Major depressive disorder, recurrent episode, moderate (HCC) -     DULoxetine (CYMBALTA) 30 MG capsule; Take 1 capsule (30 mg total) by mouth daily.    Please see After Visit Summary for patient specific instructions.  Future Appointments  Date Time Provider Department Center  08/14/2021  8:45 AM , PT [facility] None  08/20/2021  8:45 AM , , PT [facility] None  08/25/2021  3:15 PM Lenda Kelp, PT [facility] None  08/31/2021  2:30 PM [place], Helane Rima, PT [facility] None  09/02/2021  1:45 PM 08/27/2021, PT [facility] None  09/07/2021  2:30 PM 10/31/2021, PT [facility] None  09/08/2021 10:20 AM Ranelle Auker, 11/02/2021, NP [facility] None  09/09/2021  1:45 PM 11/07/2021, PT [facility] None  09/14/2021  2:30 PM [place], 11/08/2021, PT [facility] None  09/16/2021  1:45 PM 11/09/2021, PT [facility] None  09/21/2021  4:00 PM 09/16/2021, PT [facility] None  09/23/2021  2:00 PM [place], 09/18/2021, PT [facility] None    No orders of the defined types were placed in this encounter.   -------------------------------

## 2021-08-14 ENCOUNTER — Ambulatory Visit: Payer: 59

## 2021-08-14 ENCOUNTER — Other Ambulatory Visit: Payer: Self-pay

## 2021-08-14 DIAGNOSIS — R262 Difficulty in walking, not elsewhere classified: Secondary | ICD-10-CM

## 2021-08-14 DIAGNOSIS — R278 Other lack of coordination: Secondary | ICD-10-CM

## 2021-08-14 DIAGNOSIS — M6281 Muscle weakness (generalized): Secondary | ICD-10-CM

## 2021-08-14 DIAGNOSIS — R269 Unspecified abnormalities of gait and mobility: Secondary | ICD-10-CM

## 2021-08-14 NOTE — Therapy (Signed)
[facility] MAIN [facility] SERVICES [address] [place], [place], [address] Phone: [phone]   Fax:  [phone]  Physical Therapy Treatment  Patient Details  Name: Frances Lamb MRN: [id number] Date of Birth: Sep 04, 1996 Referring Provider (PT): Dr. Rosetta Posner   Encounter Date: 08/14/2021   PT End of Session - 08/14/21 0851     Visit Number 6    Number of Visits 25    Date for PT Re-Evaluation 09/22/21    Authorization Time Period Initial Certification = 06/30/2021- 09/22/2021    PT Start Time 0846    PT Stop Time 0925    PT Time Calculation (min) 39 min    Equipment Utilized During Treatment --   Left boot and crutches   Activity Tolerance Patient tolerated treatment well    Behavior During Therapy [facility] for tasks assessed/performed             Past Medical History:  Diagnosis Date   ADHD (attention deficit hyperactivity disorder)    Anxiety    Bipolar 2 disorder (HCC)    Depression    Gestational diabetes    Hypertension    Migraine    Miscarriage     Past Surgical History:  Procedure Laterality Date   ANKLE ARTHROSCOPY Left 05/21/2021   Procedure: ANKLE ARTHROSCOPY- OCD Repair, Debridement; Extensive;  Surgeon: Rosetta Posner, DPM;  Location: [facility];  Service: Podiatry;  Laterality: Left;   ANKLE RECONSTRUCTION Left 05/21/2021   Procedure: RECONSTRUCTION ANKLE- Brostrum-Gould x2;  Surgeon: Rosetta Posner, DPM;  Location: [facility];  Service: Podiatry;  Laterality: Left;  Choice, pre-op pop/saph block   CESAREAN SECTION N/A 10/28/2019   Procedure: CESAREAN SECTION;  Surgeon: Nadara Mustard, MD;  Location: [facility];  Service: Obstetrics;  Laterality: N/A;   TONSILLECTOMY AND ADENOIDECTOMY      There were no vitals filed for this visit.   Subjective Assessment - 08/14/21 0850     Subjective Patient reports doing well this week-not swelling as much.    Pertinent History PRE-OPERATIVE DIAGNOSIS: All left  foot/ankle  1.  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  Bone marrow edema talus  3.  Complete rupture of the ATFL ankle ligament with lateral ankle instability  4.  Complete tear of deep deltoid ligament with minor tearing of the superficial deltoid medial ankle with medial ankle instability     POST-OPERATIVE DIAGNOSIS: Same     PROCEDURE(S): All left foot and ankle  1.  Ankle arthroscopy with extensive debridement  2.  Subchondroplasty talus  3.  Osteochondral defect microfracture repair of talus  4.  Brostrm Gould with internal brace lateral ankle stabilization  5.  Deltoid ligament repair medial ankle    Limitations Standing;House hold activities;Walking;Lifting    How long can you sit comfortably? No limitations    How long can you stand comfortably? 5 min    How long can you walk comfortably? 2-5 min    Patient Stated Goals I want to improve my ankle stability in both ankles and strengthen them as much as I can.             INTERVENTIONS:   Therapeutic Exercises:    ROM:   DF= 0 deg active; 7 deg AAROM PF= 45 deg active IV= 38 deg EV=28 deg  Instructed in use of rolling massage stick to left achilles/gastroc- Patient reported feeling better and states going to order a massage stick for  home usage.    Patient instructed in Resistive exercises:   Ankle DF using BTB Ankle PF using BTB Ankle EV Using BTB Ankle IV Using BTB 2sets of 12 reps with increased VC, TC and visual demo to insturct in safe use of theraband and how to position properly for correct activation of muscle. Patient later able to verbalize understanding and return demo of correct technique.   Clinical Impression: Patient presents with much improved overall ROM and reports no pain during session. She was receptive to all VC for for correct technique with use of theraband and able to complete reps without difficulty today. Issued resistive exercises to a home program - patient  stated no need for handout as she could remember per education today. Patient will continue to benefit from skilled PT services for improved ROM, Strength, and functional mobility to decrease fall risk and improve overall quality of life.                     PT Education - 08/14/21 0851     Education Details Exercise techniques    Person(s) Educated Patient    Methods Explanation;Demonstration;Tactile cues;Verbal cues    Comprehension Verbalized understanding;Returned demonstration;Verbal cues required;Tactile cues required;Need further instruction              PT Short Term Goals - 06/30/21 1721       PT SHORT TERM GOAL #1   Title Pt will be independent with HEP in order to decrease ankle pain and increase strength in order to improve pain-free function at home and work.    Baseline 06/30/2021= Patient has no HEP in place.    Time 6    Period Weeks    Status New    Target Date 08/11/21               PT Long Term Goals - 06/30/21 1722       PT LONG TERM GOAL #1   Title Pt will improve FOTO to target score of 56 to display perceived improvements in ability to complete ADL's.    Baseline 06/30/2021= 34    Time 12    Period Weeks    Status New    Target Date 09/22/21      PT LONG TERM GOAL #2   Title Pt will increase LEFS by at least 9 points in order to demonstrate significant improvement in lower extremity function.    Baseline 8/2/022= 19/80    Time 12    Period Weeks    Status New    Target Date 09/22/21      PT LONG TERM GOAL #3   Title Pt will increase strength of  left ankle DF/PF by at least 1/2 MMT grade in order to demonstrate improvement in strength and function    Baseline 06/30/2021= 2-/5 left ankle DF/PF    Time 12    Period Weeks    Status New    Target Date 09/22/21      PT LONG TERM GOAL #4   Title Patient will demonstrate improved left ankle AROM DF/PF by 50% for improved overall mobility with gait including heel strike and toe  off    Baseline 06/30/2021= DF= lacking 15 deg from neutral; PF= 22 deg    Time 12    Period Weeks    Status New    Target Date 09/22/21      PT LONG TERM GOAL #5   Title Pt will decrease TUG to  below 14 seconds/decrease in order to demonstrate decreased fall risk.    Baseline 06/30/2021= TUG= 27.86 sec using crutches and 50% LLE PWB    Time 12    Period Weeks    Status New    Target Date 09/22/21      Additional Long Term Goals   Additional Long Term Goals Yes      PT LONG TERM GOAL #6   Title Pt will increase by at least 0.5 m/s in order to demonstrate clinically significant improvement in community ambulation.    Baseline 06/30/2021= 0.27 m/s    Time 12    Period Weeks    Status New    Target Date 09/22/21                   Plan - 08/14/21 1133     Clinical Impression Statement Patient presents with much improved overall ROM and reports no pain during session. She was receptive to all VC for for correct technique with use of theraband and able to complete reps without difficulty today. Issued resistive exercises to a home program - patient stated no need for handout as she could remember per education today.  Patient will continue to benefit from skilled PT services for improved ROM, Strength, and functional mobility to decrease fall risk and improve overall quality of life.    Examination-Activity Limitations Bend;Caring for Others;Carry;Dressing;Lift;Locomotion Level;Squat;Stairs;Stand;Transfers    Examination-Participation Restrictions Cleaning;Community Activity;Laundry;Yard Work    Stability/Clinical Decision Making Stable/Uncomplicated    PT Frequency 2x / week    PT Duration 12 weeks    PT Treatment/Interventions ADLs/Self Care Home Management;Cryotherapy;Moist Heat;DME Instruction;Gait training;Stair training;Functional mobility training;Therapeutic activities;Therapeutic exercise;Balance training;Neuromuscular re-education;Patient/family education;Manual  techniques;Scar mobilization;Passive range of motion;Dry needling    PT Next Visit Plan Progress ROM/strengthening following any MD recommendations.    PT Home Exercise Plan Added resistive ankle Strengthening - issued Blue and Green theraband    Consulted and Agree with Plan of Care Patient             Patient will benefit from skilled therapeutic intervention in order to improve the following deficits and impairments:  Abnormal gait, Decreased activity tolerance, Decreased balance, Decreased coordination, Decreased endurance, Decreased range of motion, Decreased strength, Difficulty walking, Pain  Visit Diagnosis: Abnormality of gait and mobility  Difficulty in walking, not elsewhere classified  Muscle weakness (generalized)  Other lack of coordination     Problem List Patient Active Problem List   Diagnosis Date Noted   Bipolar disorder, in full remission, most recent episode mixed (HCC) 11/11/2020   Sexual dysfunction 10/14/2020   Bipolar disorder, in full remission, most recent episode depressed (HCC) 07/07/2020   Insomnia due to mental disorder 03/04/2020   High risk medication use 02/05/2020   Bipolar 2 disorder, major depressive episode (HCC) 01/10/2020   Social anxiety disorder 01/10/2020   S/P cesarean section 11/14/2019   Delivery by cesarean section using transverse incision of lower segment of uterus 10/28/2019   Bipolar 2 disorder (HCC) 03/14/2019   Obesity, Class III, BMI 40-49.9 (morbid obesity) (HCC) 03/14/2019   GAD (generalized anxiety disorder) 04/18/2018    Lenda Kelp, PT 08/14/2021, 11:43 AM  [facility] MAIN [facility] SERVICES [address] [place], [place], [address] Phone: [phone]   Fax:  [phone]  Name: Frances Lamb MRN: [id number] Date of Birth: 1995-12-28

## 2021-08-18 ENCOUNTER — Ambulatory Visit: Payer: 59

## 2021-08-20 ENCOUNTER — Ambulatory Visit: Payer: 59

## 2021-08-25 ENCOUNTER — Ambulatory Visit: Payer: 59

## 2021-08-25 ENCOUNTER — Other Ambulatory Visit: Payer: Self-pay

## 2021-08-25 DIAGNOSIS — R269 Unspecified abnormalities of gait and mobility: Secondary | ICD-10-CM | POA: Diagnosis not present

## 2021-08-25 DIAGNOSIS — R2681 Unsteadiness on feet: Secondary | ICD-10-CM

## 2021-08-25 DIAGNOSIS — M6281 Muscle weakness (generalized): Secondary | ICD-10-CM
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
## 2021-08-31 ENCOUNTER — Ambulatory Visit: Payer: 59 | Attending: Podiatry

## 2021-08-31 DIAGNOSIS — M6281 Muscle weakness (generalized): Secondary | ICD-10-CM | POA: Insufficient documentation

## 2021-08-31 DIAGNOSIS — R269 Unspecified abnormalities of gait and mobility: Secondary | ICD-10-CM | POA: Insufficient documentation

## 2021-08-31 DIAGNOSIS — R262 Difficulty in walking, not elsewhere classified: Secondary | ICD-10-CM | POA: Insufficient documentation

## 2021-09-02 ENCOUNTER — Other Ambulatory Visit: Payer: Self-pay

## 2021-09-02 ENCOUNTER — Ambulatory Visit: Payer: 59

## 2021-09-02 DIAGNOSIS — M6281 Muscle weakness (generalized): Secondary | ICD-10-CM

## 2021-09-02 DIAGNOSIS — R262 Difficulty in walking, not elsewhere classified: Secondary | ICD-10-CM

## 2021-09-02 DIAGNOSIS — R269 Unspecified abnormalities of gait and mobility: Secondary | ICD-10-CM
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
## 2021-09-07 ENCOUNTER — Ambulatory Visit: Payer: 59

## 2021-09-08 ENCOUNTER — Ambulatory Visit: Payer: 59 | Admitting: Adult Health

## 2021-09-08 NOTE — Progress Notes (Signed)
Patient no show appointment. ? ?

## 2021-09-09 ENCOUNTER — Ambulatory Visit: Payer: 59

## 2021-09-14 ENCOUNTER — Ambulatory Visit: Payer: 59

## 2021-09-16 ENCOUNTER — Ambulatory Visit: Payer: 59

## 2021-09-21 ENCOUNTER — Other Ambulatory Visit: Payer: Self-pay

## 2021-09-21 ENCOUNTER — Ambulatory Visit: Payer: 59

## 2021-09-21 DIAGNOSIS — R269 Unspecified abnormalities of gait and mobility: Secondary | ICD-10-CM | POA: Diagnosis not present

## 2021-09-21 DIAGNOSIS — R262 Difficulty in walking, not elsewhere classified: Secondary | ICD-10-CM

## 2021-09-21 DIAGNOSIS — M6281 Muscle weakness (generalized): Secondary | ICD-10-CM

## 2021-09-21 NOTE — Therapy (Signed)
[place] MAIN [facility] SERVICES [address] [place], [place], [address] Phone: [phone]   Fax:  [phone]  Physical Therapy Treatment/Recertification for dates 09/21/2021- 12/14/2021  Patient Details  Name: Frances Lamb MRN: [id number] Date of Birth: [birth year] Referring Provider (PT): Dr. Caroline More   Encounter Date: 09/21/2021   PT End of Session - 09/21/21 1612     Visit Number 9    Number of Visits 25    Date for PT Re-Evaluation 12/14/21    Authorization Time Period Initial Certification = 03/02/346- 42/59/5638; Recert 75/64/3329-04/16/8415    PT Start Time 1602    PT Stop Time 1645    PT Time Calculation (min) 43 min    Equipment Utilized During Treatment Gait belt   Left boot and crutches   Activity Tolerance Patient tolerated treatment well    Behavior During Therapy WFL for tasks assessed/performed             Past Medical History:  Diagnosis Date   ADHD (attention deficit hyperactivity disorder)    Anxiety    Bipolar 2 disorder ([place])    Depression    Gestational diabetes    Hypertension    Migraine    Miscarriage     Past Surgical History:  Procedure Laterality Date   ANKLE ARTHROSCOPY Left 05/21/2021   Procedure: ANKLE ARTHROSCOPY- OCD Repair, Debridement; Extensive;  Surgeon: Caroline More, DPM;  Location: [place];  Service: Podiatry;  Laterality: Left;   ANKLE RECONSTRUCTION Left 05/21/2021   Procedure: RECONSTRUCTION ANKLE- Brostrum-Gould x2;  Surgeon: Caroline More, DPM;  Location: [place];  Service: Podiatry;  Laterality: Left;  Choice, pre-op pop/saph block   CESAREAN SECTION N/A 10/28/2019   Procedure: CESAREAN SECTION;  Surgeon: Gae Dry, MD;  Location: [facility];  Service: Obstetrics;  Laterality: N/A;   TONSILLECTOMY AND ADENOIDECTOMY      There were no vitals filed for this visit.   Subjective Assessment - 09/21/21 1611     Subjective I am doing well overall- still  want to work on my balance and still have some difficulty going down steps    Pertinent History PRE-OPERATIVE DIAGNOSIS: All left foot/ankle  1.  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  Bone marrow edema talus  3.  Complete rupture of the ATFL ankle ligament with lateral ankle instability  4.  Complete tear of deep deltoid ligament with minor tearing of the superficial deltoid medial ankle with medial ankle instability     POST-OPERATIVE DIAGNOSIS: Same     PROCEDURE(S): All left foot and ankle  1.  Ankle arthroscopy with extensive debridement  2.  Subchondroplasty talus  3.  Osteochondral defect microfracture repair of talus  4.  Brostrm Gould with internal brace lateral ankle stabilization  5.  Deltoid ligament repair medial ankle    Limitations Standing;House hold activities;Walking;Lifting    How long can you sit comfortably? No limitations    How long can you stand comfortably? 5 min    How long can you walk comfortably? 2-5 min    Patient Stated Goals I want to improve my ankle stability in both ankles and strengthen them as much as I can.    Currently in Pain? No/denies              Interventions:   Reassessed all STG and LTGs for PT recert visit  STG- Patient verbalized and demonstrated independence with Progressive ankle strengthening with no  questions or concerns at this time. GOAL MET  LTGs-   FOTO- Improved from 34 initially to 64 today- GOAL MET  LEFS scale= 61/80  LE Strength= 4/5 left DF/PF with manual muscle testing   12 deg of ankle DF; 55 deg of PF  TUG= 10.56 sec without an AD.   SLS- 10 sec on left LE  Clinical Impression: Patient presents with overall steady progression in all areas of rehab- improved ROM, LE strength, functional mobility as seen by all functional outcome measurements. Patient did report some ongoing lack of confidence with descending steps and would like to continue to progress with higher level balance.  She was limited in SLS in left LE today. Patient's condition has the potential to improve in response to therapy. Maximum improvement is yet to be obtained. The anticipated improvement is attainable and reasonable in a generally predictable time.                          PT Education - 09/21/21 2246     Education Details PT plan of care and purpose of functional outcome measures.    Person(s) Educated Patient    Methods Explanation    Comprehension Verbalized understanding              PT Short Term Goals - 09/21/21 1615       PT SHORT TERM GOAL #1   Title Pt will be independent with HEP in order to decrease ankle pain and increase strength in order to improve pain-free function at home and work.    Baseline 06/30/2021= Patient has no HEP in place. 09/21/2021- Patient reports compliant with HEP for ankle strengthening and ROM.    Time 6    Period Weeks    Status Achieved    Target Date 08/11/21               PT Long Term Goals - 09/21/21 1617       PT LONG TERM GOAL #1   Title Pt will improve FOTO to target score of 56 to display perceived improvements in ability to complete ADL's.    Baseline 06/30/2021= 34; 09/21/2021= 64    Time 12    Period Weeks    Status Achieved    Target Date 12/14/21      PT LONG TERM GOAL #2   Title Pt will increase LEFS by at least 9 points in order to demonstrate significant improvement in lower extremity function.    Baseline 8/2/022= 19/80; 09/21/2021= 61/80    Time 12    Period Weeks    Status Achieved      PT LONG TERM GOAL #3   Title Pt will increase strength of  left ankle DF/PF by at least 1/2 MMT grade in order to demonstrate improvement in strength and function    Baseline 06/30/2021= 2-/5 left ankle DF/PF; 09/21/2021= 4/5 left DF/PF with manual muscle testing    Time 12    Period Weeks    Status Achieved      PT LONG TERM GOAL #4   Title Patient will demonstrate improved left ankle AROM DF/PF by 50% for  improved overall mobility with gait including heel strike and toe off    Baseline 06/30/2021= DF= lacking 15 deg from neutral; PF= 22 deg; 09/21/2021= 12 deg of ankle DF; 55 deg of PF    Time 12    Period Weeks    Status Achieved  PT LONG TERM GOAL #5   Title Pt will decrease TUG to below 14 seconds/decrease in order to demonstrate decreased fall risk.    Baseline 06/30/2021= TUG= 27.86 sec using crutches and 50% LLE PWB. 09/21/2021= 10.56 sec without an AD    Time 12    Period Weeks    Status Achieved      Additional Long Term Goals   Additional Long Term Goals Yes      PT LONG TERM GOAL #6   Title Pt will increase 10MWT by at least 0.5 m/s in order to demonstrate clinically significant improvement in community ambulation.    Baseline 06/30/2021= 0.27 m/s; 09/21/2021= 1.08 m/s without an AD    Time 12    Period Weeks    Status Achieved      PT LONG TERM GOAL #7   Title Patient will demonstrate acending/descending 14 +steps independently - reciprocal steps with no loss of balance or difficulty.    Baseline 09/21/2021- Patient reports she avoids steps due to lack of confidence.    Time 12    Period Weeks    Status New    Target Date 12/14/21      PT LONG TERM GOAL #8   Title Patient willl demonstrate improved dynamic balance as seen by ability to single leg stance > 15 sec for improved ankle stabilization and overall balance.    Baseline 09/21/2021- Patient able to single leg stance for approx 10 sec on left ankle    Time 12    Period Weeks    Status New    Target Date 12/14/21                   Plan - 09/21/21 1613     Clinical Impression Statement Patient presents with overall steady progression in all areas of rehab- improved ROM, LE strength, functional mobility as seen by all functional outcome measurements. Patient did report some ongoing lack of confidence with descending steps and would like to continue to progress with higher level balance. She was limited in  SLS in left LE today. Patient's condition has the potential to improve in response to therapy. Maximum improvement is yet to be obtained. The anticipated improvement is attainable and reasonable in a generally predictable time.    Examination-Activity Limitations [place];Caring for Others;Carry;Dressing;Lift;Locomotion Level;Squat;Stairs;Stand;Transfers    Examination-Participation Restrictions Cleaning;Community Activity;Laundry;Yard Work    Stability/Clinical Decision Making Stable/Uncomplicated    PT Frequency 2x / week    PT Duration 12 weeks    PT Treatment/Interventions ADLs/Self Care Home Management;Cryotherapy;Moist Heat;DME Instruction;Gait training;Stair training;Functional mobility training;Therapeutic activities;Therapeutic exercise;Balance training;Neuromuscular re-education;Patient/family education;Manual techniques;Scar mobilization;Passive range of motion;Dry needling    PT Next Visit Plan Progress ROM/strengthening following any MD recommendations. continue POC    PT Home Exercise Plan Added resistive ankle Strengthening - issued Blue and Green theraband; No changes this visit    Consulted and Agree with Plan of Care Patient             Patient will benefit from skilled therapeutic intervention in order to improve the following deficits and impairments:  Abnormal gait, Decreased activity tolerance, Decreased balance, Decreased coordination, Decreased endurance, Decreased range of motion, Decreased strength, Difficulty walking, Pain  Visit Diagnosis: Abnormality of gait and mobility  Difficulty in walking, not elsewhere classified  Muscle weakness (generalized)     Problem List Patient Active Problem List   Diagnosis Date Noted   Bipolar disorder, in full remission, most recent episode mixed ([place]) 11/11/2020  Sexual dysfunction 10/14/2020   Bipolar disorder, in full remission, most recent episode depressed ([place]) 07/07/2020   Insomnia due to mental disorder 03/04/2020    High risk medication use 02/05/2020   Bipolar 2 disorder, major depressive episode ([place]) 01/10/2020   Social anxiety disorder 01/10/2020   S/P cesarean section 11/14/2019   Delivery by cesarean section using transverse incision of lower segment of uterus 10/28/2019   Bipolar 2 disorder ([place]) 03/14/2019   Obesity, Class III, BMI 40-49.9 (morbid obesity) ([place]) 03/14/2019   GAD (generalized anxiety disorder) 04/18/2018    Lewis Moccasin, PT 09/21/2021, 11:03 PM  [place] MAIN [facility] SERVICES [address] [place], [place], [address] Phone: [phone]   Fax:  [phone]  Name: Frances Lamb MRN: [id number] Date of Birth: January 27, 1996

## 2021-09-23 ENCOUNTER — Ambulatory Visit: Payer: 59

## 2021-09-30 ENCOUNTER — Ambulatory Visit: Payer: 59 | Attending: Podiatry | Admitting: Physical Therapy

## 2021-09-30 DIAGNOSIS — R269 Unspecified abnormalities of gait and mobility: Secondary | ICD-10-CM | POA: Insufficient documentation

## 2021-09-30 DIAGNOSIS — R2681 Unsteadiness on feet: Secondary | ICD-10-CM | POA: Insufficient documentation

## 2021-09-30 DIAGNOSIS — M6281 Muscle weakness (generalized): Secondary | ICD-10-CM | POA: Insufficient documentation

## 2021-09-30 DIAGNOSIS — R262 Difficulty in walking, not elsewhere classified: Secondary | ICD-10-CM | POA: Insufficient documentation

## 2021-09-30 NOTE — Patient Instructions (Incomplete)
Interventions:    Progress Note: Goals/outcome measures assessed in re-cert note on 09/21/21. Please see last PT note (09/21/21) for progress.     Therapeutic Exercises:  *stairs *balance *in/out of bathtub    Heel raises 20 reps from firm surface then 20 reps from edge of 6" step- No Loss of balance.    Stepping up onto and off of 6" step (alternating leading LE) x multiple reps. Reports does not feel 100% confident in balance.    Dynamic high marching x 20 reps no UE support - min unsteadiness yet no significant LOB.    Rockerboard - static standing x 60 sec Rockerboard - dynamic weight shift A/P x 20 reps x 2 sets Rockerboard- Lateral weight shift  x 20 reps   Tandem gait- down and back in // bars - mild unsteadiness initially yet much improved with practice.  Tandem standing on 1/2 foam x 20-30 sec each side x 4 sets - Min difficulty with left LE in back position but able to use ankle strategy without significant difficulty.  Education provided throughout session via VC/TC and demonstration to facilitate movement at target joints and correct muscle activation for all testing and exercises performed.    Clinical Impression: Patient performed well with all therex and balance activities without report of pain and no significant loss of balance. She remains well motivated and able to progress to higher level balance exercises today requiring VC for safe technique and ability to return demonstration without difficulty. The pt will benefit from further skilled PT to continue to improve ROM, strength and functoinal mobility to increase ease and safety with ADLs

## 2021-10-01 ENCOUNTER — Encounter: Payer: Self-pay | Admitting: Adult Health

## 2021-10-01 ENCOUNTER — Ambulatory Visit: Payer: 59 | Admitting: Adult Health

## 2021-10-01 ENCOUNTER — Other Ambulatory Visit: Payer: Self-pay

## 2021-10-01 DIAGNOSIS — G4700 Insomnia, unspecified: Secondary | ICD-10-CM | POA: Diagnosis not present

## 2021-10-01 DIAGNOSIS — F909 Attention-deficit hyperactivity disorder, unspecified type: Secondary | ICD-10-CM | POA: Diagnosis not present

## 2021-10-01 DIAGNOSIS — F411 Generalized anxiety disorder: Secondary | ICD-10-CM

## 2021-10-01 DIAGNOSIS — F3181 Bipolar II disorder: Secondary | ICD-10-CM

## 2021-10-01 MED ORDER — ARIPIPRAZOLE 5 MG PO TABS: ORAL_TABLET | ORAL | 2 refills | Status: DC

## 2021-10-01 MED ORDER — LISDEXAMFETAMINE DIMESYLATE 40 MG PO CAPS: 40.0000 mg | ORAL_CAPSULE | Freq: Every day | ORAL | 0 refills | Status: DC

## 2021-10-01 MED ORDER — BUPROPION HCL ER (XL) 150 MG PO TB24: ORAL_TABLET | ORAL | 2 refills | Status: DC

## 2021-10-01 NOTE — Progress Notes (Signed)
Frances Lamb [id number] [birth year] 25 y.o.  Subjective:   Patient ID:  Frances Lamb is a 25 y.o. (DOB Sep 06, 1996) female.  Chief Complaint: No chief complaint on file.   HPI Frances Lamb presents to the office today for follow-up of ADD, GAD, ADHD, BPD2, and insomnia.  Describes mood today as "ok". Pleasant. Mood symptoms - denies depression, anxiety, and irritability. Denies worry and rumination. Denies recent panic attacks. Denies mood swings. Stating "I'm doing good". Feels like current medication regimen is working well for her. Family doing well. Stable interest and motivation. Taking medications as prescribed.  Energy levels improved. Active, does not have a regular exercise routine.  Enjoys some usual interests and activities. Married - 3 years. Lives with husband and 18 month old son. Spending time with family Appetite adequate. Weight stable - 252 pounds.  Sleeping well most nights. Averages 8 hours. Focus and concentration improved. Feels like Vyvanse has been helpful - would like to increases dose. Managing aspects of household. 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  Denies AH or VH.  Previous medication trials: Lamictal, Cymbalta, Latuda, Hydroxyzine, Adderall, Vyvanse, Seroquel  PHQ2-9    Flowsheet Row Nutrition from 10/10/2019 in [facility] [place]  PHQ-2 Total Score 2  PHQ-9 Total Score 11      Flowsheet Row Admission (Discharged) from 05/21/2021 in [facility] from 03/15/2021 in [facility]  C-SSRS RISK CATEGORY No Risk No Risk        Review of Systems:  Review of Systems  Musculoskeletal:  Negative for gait problem.  Neurological:  Negative for tremors.  Psychiatric/Behavioral:         Please refer to HPI   Medications: I have reviewed the patient's current medications.  Current Outpatient Medications  Medication Sig Dispense Refill   buPROPion (WELLBUTRIN XL) 150  MG 24 hr tablet Take one tablet every morning x 7 days, then take two tablets every morning. 60 tablet 2   albuterol (PROVENTIL) (2.5 MG/3ML) 0.083% nebulizer solution Inhale into the lungs.     albuterol (VENTOLIN HFA) 108 (90 Base) MCG/ACT inhaler SMARTSIG:2 Inhalation Via Inhaler Every 4 Hours PRN     ARIPiprazole (ABILIFY) 5 MG tablet Take one tablet daily. 30 tablet 2   LamoTRIgine 250 MG TB24 24 hour tablet Take 1 tablet by mouth daily. 30 tablet 5   lisdexamfetamine (VYVANSE) 40 MG capsule Take 1 capsule (40 mg total) by mouth daily. 30 capsule 0   norgestimate-ethinyl estradiol (ORTHO-CYCLEN) 0.25-35 MG-MCG tablet TAKE 1 TABLET BY MOUTH EVERY DAY 84 tablet 1   ondansetron (ZOFRAN) 4 MG tablet Take 1 tablet (4 mg total) by mouth every 8 (eight) hours as needed for nausea or vomiting. 20 tablet 0   phentermine (ADIPEX-P) 37.5 MG tablet One tablet po in morning. 30 tablet 1   QUEtiapine (SEROQUEL) 200 MG tablet Take 1 tablet (200 mg total) by mouth at bedtime. 30 tablet 5   topiramate (TOPAMAX) 25 MG tablet Take 1 tablet (25 mg total) by mouth 2 (two) times daily. 60 tablet 6   No current facility-administered medications for this visit.    Medication Side Effects: None  Allergies:  Allergies  Allergen Reactions   Cephalexin Rash    Past Medical History:  Diagnosis Date   ADHD (attention deficit hyperactivity disorder)    Anxiety    Bipolar 2 disorder (HCC)    Depression    Gestational diabetes  Hypertension    Migraine    Miscarriage     Past Medical History, Surgical history, Social history, and Family history were reviewed and updated as appropriate.   Please see review of systems for further details on the patient's review from today.   Objective:   Physical Exam:  There were no vitals taken for this visit.  Physical Exam Constitutional:      General: She is not in acute distress. Musculoskeletal:        General: No deformity.  Neurological:     Mental  Status: She is alert and oriented to person, place, and time.     Coordination: Coordination normal.  Psychiatric:        Attention and Perception: Attention and perception normal. She does not perceive auditory or visual hallucinations.        Mood and Affect: Mood normal. Mood is not anxious or depressed. Affect is not labile, blunt, angry or inappropriate.        Speech: Speech normal.        Behavior: Behavior normal.        Thought Content: Thought content normal. Thought content is not paranoid or delusional. Thought content does not include homicidal or suicidal ideation. Thought content does not include homicidal or suicidal plan.        Cognition and Memory: Cognition and memory normal.        Judgment: Judgment normal.     Comments: Insight intact    Lab Review:     Component Value Date/Time   NA 137 05/02/2020 1914   NA 138 09/16/2013 2333   K 4.2 05/02/2020 1914   K 3.2 (L) 09/16/2013 2333   CL 102 05/02/2020 1914   CL 109 (H) 09/16/2013 2333   CO2 24 05/02/2020 1914   CO2 22 09/16/2013 2333   GLUCOSE 106 (H) 05/02/2020 1914   GLUCOSE 153 (H) 09/16/2013 2333   BUN 13 05/02/2020 1914   BUN 7 09/16/2013 2333   CREATININE 0.69 05/02/2020 1914   CREATININE 0.66 09/16/2013 2333   CALCIUM 9.3 05/02/2020 1914   CALCIUM 8.8 (L) 09/16/2013 2333   PROT 7.8 05/02/2020 1914   ALBUMIN 4.0 05/02/2020 1914   AST 22 05/02/2020 1914   ALT 17 05/02/2020 1914   ALKPHOS 104 05/02/2020 1914   BILITOT 0.6 05/02/2020 1914   GFRNONAA >60 05/02/2020 1914   GFRNONAA >60 09/16/2013 2333   GFRAA >60 05/02/2020 1914   GFRAA >60 09/16/2013 2333       Component Value Date/Time   WBC 11.0 (H) 05/02/2020 1914   RBC 5.05 05/02/2020 1914   HGB 12.8 05/02/2020 1914   HGB 11.1 08/27/2019 1048   HCT 39.0 05/02/2020 1914   HCT 34.0 08/27/2019 1048   PLT 385 05/02/2020 1914   PLT 276 08/27/2019 1048   MCV 77.2 (L) 05/02/2020 1914   MCV 86 08/27/2019 1048   MCV 84 09/16/2013 2333   MCH  25.3 (L) 05/02/2020 1914   MCHC 32.8 05/02/2020 1914   RDW 15.1 05/02/2020 1914   RDW 13.2 08/27/2019 1048   RDW 13.5 09/16/2013 2333   LYMPHSABS 2.6 08/27/2019 1048   MONOABS 0.8 03/13/2019 0425   EOSABS 0.1 08/27/2019 1048   BASOSABS 0.0 08/27/2019 1048    No results found for: POCLITH, LITHIUM   No results found for: PHENYTOIN, PHENOBARB, VALPROATE, CBMZ   .res Assessment: Plan:     Plan:  PDMP reviewed  1. Lamictal 250mg  daily 2. Discontinue Cymbalta 30mg  daily  3. Seroquel 100mg  at bedtime 4. Increase Vyvanse 30mg  to 40mg  every morning 5. Abilify 5mg  daily 6. Add Wellbutrin XL 150mg  daily x 7 days, then increase to 300mg  daily. Denies seizure history.   RTC 4 weeks  Set up with a therapist  Patient advised to contact office with any questions, adverse effects, or acute worsening in signs and symptoms.   Discussed potential benefits, risks, and side effects of stimulants with patient to include increased heart rate, palpitations, insomnia, increased anxiety, increased irritability, or decreased appetite.  Instructed patient to contact office if experiencing any significant tolerability issues.   Counseled patient regarding potential benefits, risks, and side effects of Lamictal to include potential risk of Stevens-Johnson syndrome. Advised patient to stop taking Lamictal and contact office immediately if rash develops and to seek urgent medical attention if rash is severe and/or spreading quickly.  Diagnoses and all orders for this visit:  Insomnia, unspecified type  Bipolar II disorder (HCC) -     ARIPiprazole (ABILIFY) 5 MG tablet; Take one tablet daily.  Attention deficit hyperactivity disorder (ADHD), unspecified ADHD type -     lisdexamfetamine (VYVANSE) 40 MG capsule; Take 1 capsule (40 mg total) by mouth daily.  Generalized anxiety disorder  Other orders -     buPROPion (WELLBUTRIN XL) 150 MG 24 hr tablet; Take one tablet every morning x 7 days, then  take two tablets every morning.    Please see After Visit Summary for patient specific instructions.  Future Appointments  Date Time Provider Department Center  10/05/2021  1:00 PM , PT [facility] None  10/09/2021  9:30 AM B, PT [facility] None  10/14/2021  2:30 PM , PT [facility] None  10/15/2021  2:10 PM Lenda Kelp, MD [facility] None  10/20/2021 11:45 AM Thresa Ross, PT [facility] None  10/28/2021  2:30 PM Lenda Kelp, PT [facility] None  10/29/2021  3:00 PM Zabdi Mis, Nadara Mustard, NP [facility] None    No orders of the defined types were placed in this encounter.   -------------------------------

## 2021-10-05 ENCOUNTER — Ambulatory Visit: Payer: 59

## 2021-10-09 ENCOUNTER — Ambulatory Visit: Payer: 59 | Admitting: Physical Therapy

## 2021-10-14 ENCOUNTER — Ambulatory Visit: Payer: 59

## 2021-10-15 ENCOUNTER — Ambulatory Visit: Payer: 59 | Admitting: Obstetrics & Gynecology

## 2021-10-20 ENCOUNTER — Other Ambulatory Visit: Payer: Self-pay

## 2021-10-20 ENCOUNTER — Ambulatory Visit: Payer: 59

## 2021-10-20 DIAGNOSIS — R2681 Unsteadiness on feet: Secondary | ICD-10-CM | POA: Diagnosis present

## 2021-10-20 DIAGNOSIS — M6281 Muscle weakness (generalized): Secondary | ICD-10-CM | POA: Diagnosis present

## 2021-10-20 DIAGNOSIS — R262 Difficulty in walking, not elsewhere classified: Secondary | ICD-10-CM | POA: Diagnosis present

## 2021-10-20 DIAGNOSIS — R269 Unspecified abnormalities of gait and mobility: Secondary | ICD-10-CM

## 2021-10-20 NOTE — Therapy (Signed)
[place] MAIN [facility] SERVICES [address] [place], [place], [address] Phone: [phone]   Fax:  [phone]  Physical Therapy Treatment/Physical Therapy Progress Note   Dates of reporting period  06/30/2021   to   10/20/2021  Patient Details  Name: Frances Lamb MRN: [id number] Date of Birth: [birth year] Referring Provider (PT): Dr. Caroline More   Encounter Date: 10/20/2021   PT End of Session - 10/20/21 1154     Visit Number 10    Number of Visits 25    Date for PT Re-Evaluation 12/14/21    Authorization Time Period Initial Certification = [id number]; Recert [id number]; progress note on 10/20/2021    PT Start Time 1148    PT Stop Time 1226    PT Time Calculation (min) 38 min    Equipment Utilized During Treatment Gait belt   Left boot and crutches   Activity Tolerance Patient tolerated treatment well    Behavior During Therapy WFL for tasks assessed/performed             Past Medical History:  Diagnosis Date   ADHD (attention deficit hyperactivity disorder)    Anxiety    Bipolar 2 disorder ([place])    Depression    Gestational diabetes    Hypertension    Migraine    Miscarriage     Past Surgical History:  Procedure Laterality Date   ANKLE ARTHROSCOPY Left 05/21/2021   Procedure: ANKLE ARTHROSCOPY- OCD Repair, Debridement; Extensive;  Surgeon: Caroline More, DPM;  Location: [place];  Service: Podiatry;  Laterality: Left;   ANKLE RECONSTRUCTION Left 05/21/2021   Procedure: RECONSTRUCTION ANKLE- Brostrum-Gould x2;  Surgeon: Caroline More, DPM;  Location: [place];  Service: Podiatry;  Laterality: Left;  Choice, pre-op pop/saph block   CESAREAN SECTION N/A 10/28/2019   Procedure: CESAREAN SECTION;  Surgeon: Gae Dry, MD;  Location: [facility];  Service: Obstetrics;  Laterality: N/A;   TONSILLECTOMY AND ADENOIDECTOMY      There were no vitals filed for this visit.   Subjective  Assessment - 10/20/21 1153     Subjective Patient reports doing well- denies any pain. Reports still using her ASO brace when out. States she did have a freak fall a while back misstepped at home but denied any injury. She states she missed some recent appointments due to family medical issues.    Pertinent History PRE-OPERATIVE DIAGNOSIS: All left foot/ankle  1.  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  Bone marrow edema talus  3.  Complete rupture of the ATFL ankle ligament with lateral ankle instability  4.  Complete tear of deep deltoid ligament with minor tearing of the superficial deltoid medial ankle with medial ankle instability     POST-OPERATIVE DIAGNOSIS: Same     PROCEDURE(S): All left foot and ankle  1.  Ankle arthroscopy with extensive debridement  2.  Subchondroplasty talus  3.  Osteochondral defect microfracture repair of talus  4.  Brostrm Gould with internal brace lateral ankle stabilization  5.  Deltoid ligament repair medial ankle    Limitations Standing;House hold activities;Walking;Lifting    How long can you sit comfortably? No limitations    How long can you stand comfortably? 5 min    How long can you walk comfortably? 2-5 min    Patient Stated Goals I want to improve my ankle stability in both ankles and strengthen them as much as I can.    Currently  in Pain? No/denies              INTERVENTIONS:   Assessed goals for progress note    Single leg stance duration Right LE = 23 sec Left LE = 10.46 sec  Static standing on blue airex pad with back leg and on yellow dynadisc with front leg x 30 sec hold x 3 each leg. More stability per patient with right foot in front.  Dynamic standing high march on blue airex pad x 10 reps BLE. (Slightly less time on left LE vs. Right)     -Tandem stand on foam roll (curve side up) x 20 sec x 2 with each leg- hands hovering but not touching bar.  - Tandem walk on foam roll (curve side up) x  length of foam x 4 sets (down and back = 1 set)  Side standing on foam roll with (curve side down)  x 30 sec x 2.  Tandem walk (curve side down)  x length of foam and back x 4.   Calf press- precor (40Lb) 3 sets of single leg (left) 12 reps- Patient reports as medium hard.  Education provided throughout session via VC/TC and demonstration to facilitate movement at target joints and correct muscle activation for all testing and exercises performed.    Clinical Impression: Patient presents today with good motivation today and able to progress well with dynamic balance activities. She was able to slightly improve her single leg stance and much improved tandem walk and no significant loss of balance throughout session today. Patient's condition has the potential to improve in response to therapy. Maximum improvement is yet to be obtained. The anticipated improvement is attainable and reasonable in a generally predictable time.  The pt will benefit from further skilled PT to continue to improve ROM, strength and functoinal mobility to increase ease and safety with ADLs             PT Education - 10/20/21 1318     Education Details exercise technique    Person(s) Educated Patient    Methods Explanation;Demonstration;Tactile cues;Verbal cues;Other (comment)    Comprehension Verbalized understanding;Returned demonstration;Verbal cues required;Need further instruction;Tactile cues required              PT Short Term Goals - 09/21/21 1615       PT SHORT TERM GOAL #1   Title Pt will be independent with HEP in order to decrease ankle pain and increase strength in order to improve pain-free function at home and work.    Baseline 06/30/2021= Patient has no HEP in place. 09/21/2021- Patient reports compliant with HEP for ankle strengthening and ROM.    Time 6    Period Weeks    Status Achieved    Target Date 08/11/21               PT Long Term Goals - 10/20/21 1312       PT LONG  TERM GOAL #1   Title Pt will improve FOTO to target score of 56 to display perceived improvements in ability to complete ADL's.    Baseline 06/30/2021= 34; 09/21/2021= 64    Time 12    Period Weeks    Status Achieved      PT LONG TERM GOAL #2   Title Pt will increase LEFS by at least 9 points in order to demonstrate significant improvement in lower extremity function.    Baseline 8/2/022= 19/80; 09/21/2021= 61/80    Time 12  Period Weeks    Status Achieved      PT LONG TERM GOAL #3   Title Pt will increase strength of  left ankle DF/PF by at least 1/2 MMT grade in order to demonstrate improvement in strength and function    Baseline 06/30/2021= 2-/5 left ankle DF/PF; 09/21/2021= 4/5 left DF/PF with manual muscle testing    Time 12    Period Weeks    Status Achieved      PT LONG TERM GOAL #4   Title Patient will demonstrate improved left ankle AROM DF/PF by 50% for improved overall mobility with gait including heel strike and toe off    Baseline 06/30/2021= DF= lacking 15 deg from neutral; PF= 22 deg; 09/21/2021= 12 deg of ankle DF; 55 deg of PF    Time 12    Period Weeks    Status Achieved      PT LONG TERM GOAL #5   Title Pt will decrease TUG to below 14 seconds/decrease in order to demonstrate decreased fall risk.    Baseline 06/30/2021= TUG= 27.86 sec using crutches and 50% LLE PWB. 09/21/2021= 10.56 sec without an AD    Time 12    Period Weeks    Status Achieved      PT LONG TERM GOAL #6   Title Pt will increase by at least 0.5 m/s in order to demonstrate clinically significant improvement in community ambulation.    Baseline 06/30/2021= 0.27 m/s; 09/21/2021= 1.08 m/s without an AD    Time 12    Period Weeks    Status Achieved      PT LONG TERM GOAL #7   Title Patient will demonstrate acending/descending 14 +steps independently - reciprocal steps with no loss of balance or difficulty.    Baseline 09/21/2021- Patient reports she avoids steps due to lack of confidence.     Time 12    Period Weeks    Status New      PT LONG TERM GOAL #8   Title Patient willl demonstrate improved dynamic balance as seen by ability to single leg stance > 15 sec for improved ankle stabilization and overall balance.    Baseline 09/21/2021- Patient able to single leg stance for approx 10 sec on left ankle. 11/22-Left LE = 10.46 sec    Time 12    Period Weeks    Status On-going                   Plan - 10/20/21 1316     Clinical Impression Statement Patient presents today with good motivation today and able to progress well with dynamic balance activities. She was able to slightly improve her single leg stance and much improved tandem walk and no significant loss of balance throughout session today. Patient's condition has the potential to improve in response to therapy. Maximum improvement is yet to be obtained. The anticipated improvement is attainable and reasonable in a generally predictable time.  ??The pt will benefit from further skilled PT to continue to improve ROM, strength and functoinal mobility to increase ease and safety with ADLs    Examination-Activity Limitations [place];Caring for Others;Carry;Dressing;Lift;Locomotion Level;Squat;Stairs;Stand;Transfers    Examination-Participation Restrictions Cleaning;Community Activity;Laundry;Yard Work    Stability/Clinical Decision Making Stable/Uncomplicated    PT Frequency 2x / week    PT Duration 12 weeks    PT Treatment/Interventions ADLs/Self Care Home Management;Cryotherapy;Moist Heat;DME Instruction;Gait training;Stair training;Functional mobility training;Therapeutic activities;Therapeutic exercise;Balance training;Neuromuscular re-education;Patient/family education;Manual techniques;Scar mobilization;Passive range of motion;Dry needling  PT Next Visit Plan Progress ROM/strengthening following any MD recommendations. continue POC    PT Home Exercise Plan Added resistive ankle Strengthening - issued Blue and Green  theraband; No changes this visit    Consulted and Agree with Plan of Care Patient             Patient will benefit from skilled therapeutic intervention in order to improve the following deficits and impairments:  Abnormal gait, Decreased activity tolerance, Decreased balance, Decreased coordination, Decreased endurance, Decreased range of motion, Decreased strength, Difficulty walking, Pain  Visit Diagnosis: Abnormality of gait and mobility  Difficulty in walking, not elsewhere classified  Muscle weakness (generalized)  Unsteadiness on feet     Problem List Patient Active Problem List   Diagnosis Date Noted   Bipolar disorder, in full remission, most recent episode mixed ([place]) 11/11/2020   Sexual dysfunction 10/14/2020   Bipolar disorder, in full remission, most recent episode depressed ([place]) 07/07/2020   Insomnia due to mental disorder 03/04/2020   High risk medication use 02/05/2020   Bipolar 2 disorder, major depressive episode ([place]) 01/10/2020   Social anxiety disorder 01/10/2020   S/P cesarean section 11/14/2019   Delivery by cesarean section using transverse incision of lower segment of uterus 10/28/2019   Bipolar 2 disorder ([place]) 03/14/2019   Obesity, Class III, BMI 40-49.9 (morbid obesity) ([place]) 03/14/2019   GAD (generalized anxiety disorder) 04/18/2018    Lewis Moccasin, PT 10/20/2021, 1:23 PM  [place] MAIN [facility] SERVICES [address] [place], [place], [address] Phone: [phone]   Fax:  [phone]  Name: Frances Lamb MRN: [id number] Date of Birth: 02/17/1996

## 2021-10-28 ENCOUNTER — Ambulatory Visit: Payer: 59

## 2021-10-29 ENCOUNTER — Ambulatory Visit: Payer: 59 | Admitting: Adult Health

## 2021-11-02 ENCOUNTER — Ambulatory Visit: Payer: 59 | Admitting: Adult Health

## 2021-11-02 NOTE — Progress Notes (Signed)
Patient no show appointment. ? ?

## 2021-11-06 ENCOUNTER — Other Ambulatory Visit: Payer: Self-pay

## 2021-11-06 ENCOUNTER — Encounter: Payer: Self-pay | Admitting: Adult Health

## 2021-11-06 ENCOUNTER — Ambulatory Visit (INDEPENDENT_AMBULATORY_CARE_PROVIDER_SITE_OTHER): Payer: 59 | Admitting: Adult Health

## 2021-11-06 DIAGNOSIS — G4700 Insomnia, unspecified: Secondary | ICD-10-CM

## 2021-11-06 DIAGNOSIS — F411 Generalized anxiety disorder: Secondary | ICD-10-CM | POA: Diagnosis not present

## 2021-11-06 DIAGNOSIS — F331 Major depressive disorder, recurrent, moderate: Secondary | ICD-10-CM | POA: Diagnosis not present

## 2021-11-06 DIAGNOSIS — F909 Attention-deficit hyperactivity disorder, unspecified type: Secondary | ICD-10-CM

## 2021-11-06 MED ORDER — AMPHETAMINE-DEXTROAMPHETAMINE 30 MG PO TABS: 30.0000 mg | ORAL_TABLET | Freq: Every day | ORAL | 0 refills | Status: DC

## 2021-11-06 MED ORDER — LISDEXAMFETAMINE DIMESYLATE 50 MG PO CAPS: 50.0000 mg | ORAL_CAPSULE | Freq: Every day | ORAL | 0 refills | Status: DC

## 2021-11-06 NOTE — Progress Notes (Signed)
Frances Lamb [id number] 1996/03/11 25 y.o.  Subjective:   Patient ID:  Frances Lamb is a 25 y.o. (DOB [birth year]) female.  Chief Complaint: No chief complaint on file.   HPI Frances Lamb presents to the office today for follow-up of ADD, GAD, ADHD, BPD2, and insomnia.  Describes mood today as "ok". Pleasant. Mood symptoms - denies depression, anxiety, and irritability. Denies worry and rumination. Denies recent panic attacks. Denies mood swings. Stating "I'm doing well". Feels like current medication regimen is working well for her. Libido has normalized. Family doing well. Stable interest and motivation. Taking medications as prescribed.   Energy levels good. Active, does not have a regular exercise routine.  Enjoys some usual interests and activities. Married - 3 years. Lives with husband and 78 years old. Spending time with family Appetite adequate. Weight stable - 252 pounds.  Sleeping well most nights. Averages 8 hours. Focus and concentration - could be a little bit better. Feels like Vyvanse wears off after 7 hours. Managing aspects of household. 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  Denies AH or VH.  Previous medication trials: Lamictal, Cymbalta, Latuda, Hydroxyzine, Adderall, Vyvanse, Seroquel   PHQ2-9    Flowsheet Row Nutrition from 10/10/2019 in [facility] [place]  PHQ-2 Total Score 2  PHQ-9 Total Score 11      Flowsheet Row Admission (Discharged) from 05/21/2021 in [facility] from 03/15/2021 in [facility]  C-SSRS RISK CATEGORY No Risk No Risk        Review of Systems:  Review of Systems  Musculoskeletal:  Negative for gait problem.  Neurological:  Negative for tremors.  Psychiatric/Behavioral:         Please refer to HPI   Medications: I have reviewed the patient's current medications.  Current Outpatient Medications  Medication Sig Dispense Refill   albuterol  (PROVENTIL) (2.5 MG/3ML) 0.083% nebulizer solution Inhale into the lungs.     albuterol (VENTOLIN HFA) 108 (90 Base) MCG/ACT inhaler SMARTSIG:2 Inhalation Via Inhaler Every 4 Hours PRN     ARIPiprazole (ABILIFY) 5 MG tablet Take one tablet daily. 30 tablet 2   buPROPion (WELLBUTRIN XL) 150 MG 24 hr tablet Take one tablet every morning x 7 days, then take two tablets every morning. 60 tablet 2   LamoTRIgine 250 MG TB24 24 hour tablet Take 1 tablet by mouth daily. 30 tablet 5   lisdexamfetamine (VYVANSE) 40 MG capsule Take 1 capsule (40 mg total) by mouth daily. 30 capsule 0   norgestimate-ethinyl estradiol (ORTHO-CYCLEN) 0.25-35 MG-MCG tablet TAKE 1 TABLET BY MOUTH EVERY DAY 84 tablet 1   ondansetron (ZOFRAN) 4 MG tablet Take 1 tablet (4 mg total) by mouth every 8 (eight) hours as needed for nausea or vomiting. 20 tablet 0   phentermine (ADIPEX-P) 37.5 MG tablet One tablet po in morning. 30 tablet 1   QUEtiapine (SEROQUEL) 200 MG tablet Take 1 tablet (200 mg total) by mouth at bedtime. 30 tablet 5   topiramate (TOPAMAX) 25 MG tablet Take 1 tablet (25 mg total) by mouth 2 (two) times daily. 60 tablet 6   No current facility-administered medications for this visit.    Medication Side Effects: None  Allergies:  Allergies  Allergen Reactions   Cephalexin Rash    Past Medical History:  Diagnosis Date   ADHD (attention deficit hyperactivity disorder)    Anxiety    Bipolar 2 disorder (HCC)    Depression  Gestational diabetes    Hypertension    Migraine    Miscarriage     Past Medical History, Surgical history, Social history, and Family history were reviewed and updated as appropriate.   Please see review of systems for further details on the patient's review from today.   Objective:   Physical Exam:  There were no vitals taken for this visit.  Physical Exam Constitutional:      General: She is not in acute distress. Musculoskeletal:        General: No deformity.   Neurological:     Mental Status: She is alert and oriented to person, place, and time.     Coordination: Coordination normal.  Psychiatric:        Attention and Perception: Attention and perception normal. She does not perceive auditory or visual hallucinations.        Mood and Affect: Mood normal. Mood is not anxious or depressed. Affect is not labile, blunt, angry or inappropriate.        Speech: Speech normal.        Behavior: Behavior normal.        Thought Content: Thought content normal. Thought content is not paranoid or delusional. Thought content does not include homicidal or suicidal ideation. Thought content does not include homicidal or suicidal plan.        Cognition and Memory: Cognition and memory normal.        Judgment: Judgment normal.     Comments: Insight intact    Lab Review:     Component Value Date/Time   NA 137 05/02/2020 1914   NA 138 09/16/2013 2333   K 4.2 05/02/2020 1914   K 3.2 (L) 09/16/2013 2333   CL 102 05/02/2020 1914   CL 109 (H) 09/16/2013 2333   CO2 24 05/02/2020 1914   CO2 22 09/16/2013 2333   GLUCOSE 106 (H) 05/02/2020 1914   GLUCOSE 153 (H) 09/16/2013 2333   BUN 13 05/02/2020 1914   BUN 7 09/16/2013 2333   CREATININE 0.69 05/02/2020 1914   CREATININE 0.66 09/16/2013 2333   CALCIUM 9.3 05/02/2020 1914   CALCIUM 8.8 (L) 09/16/2013 2333   PROT 7.8 05/02/2020 1914   ALBUMIN 4.0 05/02/2020 1914   AST 22 05/02/2020 1914   ALT 17 05/02/2020 1914   ALKPHOS 104 05/02/2020 1914   BILITOT 0.6 05/02/2020 1914   GFRNONAA >60 05/02/2020 1914   GFRNONAA >60 09/16/2013 2333   GFRAA >60 05/02/2020 1914   GFRAA >60 09/16/2013 2333       Component Value Date/Time   WBC 11.0 (H) 05/02/2020 1914   RBC 5.05 05/02/2020 1914   HGB 12.8 05/02/2020 1914   HGB 11.1 08/27/2019 1048   HCT 39.0 05/02/2020 1914   HCT 34.0 08/27/2019 1048   PLT 385 05/02/2020 1914   PLT 276 08/27/2019 1048   MCV 77.2 (L) 05/02/2020 1914   MCV 86 08/27/2019 1048   MCV  84 09/16/2013 2333   MCH 25.3 (L) 05/02/2020 1914   MCHC 32.8 05/02/2020 1914   RDW 15.1 05/02/2020 1914   RDW 13.2 08/27/2019 1048   RDW 13.5 09/16/2013 2333   LYMPHSABS 2.6 08/27/2019 1048   MONOABS 0.8 03/13/2019 0425   EOSABS 0.1 08/27/2019 1048   BASOSABS 0.0 08/27/2019 1048    No results found for: POCLITH, LITHIUM   No results found for: PHENYTOIN, PHENOBARB, VALPROATE, CBMZ   .res Assessment: Plan:    Plan:  PDMP reviewed  1. Lamictal 250mg  daily 2.  Wellbutrin XL 300mg  daily. Denies seizure history. 3. Seroquel 100mg  at bedtime 4. Increase Vyvanse 40mg  to 50mg  every morning 5. Abilify 5mg  daily 6. Add Adderall 10mg  for afternoon use  113/76/93  RTC 4 weeks  Set up with a therapist  Patient advised to contact office with any questions, adverse effects, or acute worsening in signs and symptoms.   Discussed potential benefits, risks, and side effects of stimulants with patient to include increased heart rate, palpitations, insomnia, increased anxiety, increased irritability, or decreased appetite.  Instructed patient to contact office if experiencing any significant tolerability issues.   Counseled patient regarding potential benefits, risks, and side effects of Lamictal to include potential risk of Stevens-Johnson syndrome. Advised patient to stop taking Lamictal and contact office immediately if rash develops and to seek urgent medical attention if rash is severe and/or spreading quickly.  There are no diagnoses linked to this encounter.   Please see After Visit Summary for patient specific instructions.  Future Appointments  Date Time Provider Department Center  11/11/2021  1:50 PM , MD [facility] None    No orders of the defined types were placed in this encounter.   -------------------------------

## 2021-11-11 ENCOUNTER — Encounter: Payer: Self-pay | Admitting: Obstetrics & Gynecology

## 2021-11-11 ENCOUNTER — Ambulatory Visit (INDEPENDENT_AMBULATORY_CARE_PROVIDER_SITE_OTHER): Payer: 59 | Admitting: Obstetrics & Gynecology

## 2021-11-11 ENCOUNTER — Other Ambulatory Visit: Payer: Self-pay

## 2021-11-11 VITALS — BP 120/80 | Ht 62.0 in | Wt 249.0 lb

## 2021-11-11 MED ORDER — OZEMPIC (0.25 OR 0.5 MG/DOSE) 2 MG/1.5ML ~~LOC~~ SOPN: 0.5000 mg | PEN_INJECTOR | SUBCUTANEOUS | 11 refills | Status: DC

## 2021-11-11 NOTE — Progress Notes (Signed)
°  History of Present Illness:  Frances Lamb is a 25 y.o. who was started on  Phentermine w Topamax approximately 4 months ago due to obesity/abnormal weight gain. The patient reports no significant weight change..   She has these side effects: none.  She has been off of these meds for the last 2 mos.  She has h/o diabetes, but not on meds currently.  She has been started on Wellbutrin for anxiety/depression.  She has also been started on Adderall recently, as well as Vyvanse.  PMHx: She  has a past medical history of ADHD (attention deficit hyperactivity disorder), Anxiety, Bipolar 2 disorder (HCC), Depression, Gestational diabetes, Hypertension, Migraine, and Miscarriage. Also,  has a past surgical history that includes Tonsillectomy and adenoidectomy; Cesarean section (N/A, 10/28/2019); Ankle reconstruction (Left, 05/21/2021); and Ankle arthroscopy (Left, 05/21/2021)., family history includes Anxiety disorder in her mother; Bipolar disorder in her mother; Depression in her mother; Diabetes in her maternal grandfather and mother; OCD in her mother.,  reports that she quit smoking about 8 years ago. Her smoking use included cigarettes. She has never used smokeless tobacco. She reports current alcohol use of about 4.0 standard drinks per week. She reports that she does not use drugs.  She has a current medication list which includes the following prescription(s): albuterol, amphetamine-dextroamphetamine, aripiprazole, bupropion, lamotrigine, lisdexamfetamine, norgestimate-ethinyl estradiol, ondansetron, phentermine, quetiapine, ozempic (0.25 or 0.5 mg/dose), topiramate, and albuterol. Also, is allergic to cephalexin.  Review of Systems  All other systems reviewed and are negative.  Physical Exam:  BP 120/80    Ht 5\' 2"  (1.575 m)    Wt 249 lb (112.9 kg)    BMI 45.54 kg/m  Body mass index is 45.54 kg/m. Filed Weights   11/11/21 1326  Weight: 249 lb (112.9 kg)    Physical Exam Constitutional:       General: She is not in acute distress.    Appearance: She is well-developed.  Musculoskeletal:        General: Normal range of motion.  Neurological:     Mental Status: She is alert and oriented to person, place, and time.  Skin:    General: Skin is warm and dry.  Vitals reviewed.    Assessment:    ICD-10-CM   1. Obesity, Class III, BMI 40-49.9 (morbid obesity) (HCC)  E66.01      Medication treatment is going marginally for her.  Plan: Patient is no longer to take Phentermine. SHe has not had a great response to this medicine, and also she is not starting Adderall where there could be an interaction.  We will also stop the Topamax..   She is counseled that the Wellbutrin, Vyvanse, and Adderall all have weight loss potential.  We discussed the option of Ozempic as an additional medicine to safely assist with weight loss.  Pros and cons discussed. Info gv.  Rx sent.  Will continue to assist patient in incorporating positive experiences into her life to promote a positive mental attitude.  Education given regarding appropriate lifestyle changes for weight loss, including regular physical activity, healthy coping strategies, caloric restriction, and healthy eating patterns. \ A total of 22 minutes were spent face-to-face with the patient as well as preparation, review, communication, and documentation during this encounter.   11/13/21, MD, Annamarie Major Ob/Gyn, [facility] 11/11/2021  1:57 PM

## 2021-11-11 NOTE — Patient Instructions (Signed)
Semaglutide Injection °What is this medication? °SEMAGLUTIDE (SEM a GLOO tide) treats type 2 diabetes. It works by increasing insulin levels in your body, which decreases your blood sugar (glucose). It also reduces the amount of sugar released into the blood and slows down your digestion. It can also be used to lower the risk of heart attack and stroke in people with type 2 diabetes. Changes to diet and exercise are often combined with this medication. °This medicine may be used for other purposes; ask your health care provider or pharmacist if you have questions. °COMMON BRAND NAME(S): OZEMPIC °What should I tell my care team before I take this medication? °They need to know if you have any of these conditions: °Endocrine tumors (MEN 2) or if someone in your family had these tumors °Eye disease, vision problems °History of pancreatitis °Kidney disease °Stomach problems °Thyroid cancer or if someone in your family had thyroid cancer °An unusual or allergic reaction to semaglutide, other medications, foods, dyes, or preservatives °Pregnant or trying to get pregnant °Breast-feeding °How should I use this medication? °This medication is for injection under the skin of your upper leg (thigh), stomach area, or upper arm. It is given once every week (every 7 days). You will be taught how to prepare and give this medication. Use exactly as directed. Take your medication at regular intervals. Do not take it more often than directed. °If you use this medication with insulin, you should inject this medication and the insulin separately. Do not mix them together. Do not give the injections right next to each other. Change (rotate) injection sites with each injection. °It is important that you put your used needles and syringes in a special sharps container. Do not put them in a trash can. If you do not have a sharps container, call your pharmacist or care team to get one. °A special MedGuide will be given to you by the  pharmacist with each prescription and refill. Be sure to read this information carefully each time. °This medication comes with INSTRUCTIONS FOR USE. Ask your pharmacist for directions on how to use this medication. Read the information carefully. Talk to your pharmacist or care team if you have questions. °Talk to your care team about the use of this medication in children. Special care may be needed. °Overdosage: If you think you have taken too much of this medicine contact a poison control center or emergency room at once. °NOTE: This medicine is only for you. Do not share this medicine with others. °What if I miss a dose? °If you miss a dose, take it as soon as you can within 5 days after the missed dose. Then take your next dose at your regular weekly time. If it has been longer than 5 days after the missed dose, do not take the missed dose. Take the next dose at your regular time. Do not take double or extra doses. If you have questions about a missed dose, contact your care team for advice. °What may interact with this medication? °Other medications for diabetes °Many medications may cause changes in blood sugar, these include: °Alcohol containing beverages °Antiviral medications for HIV or AIDS °Aspirin and aspirin-like medications °Certain medications for blood pressure, heart disease, irregular heart beat °Chromium °Diuretics °Female hormones, such as estrogens or progestins, birth control pills °Fenofibrate °Gemfibrozil °Isoniazid °Lanreotide °Female hormones or anabolic steroids °MAOIs like Carbex, Eldepryl, Marplan, Nardil, and Parnate °Medications for weight loss °Medications for allergies, asthma, cold, or cough °Medications for depression,   anxiety, or psychotic disturbances °Niacin °Nicotine °NSAIDs, medications for pain and inflammation, like ibuprofen or naproxen °Octreotide °Pasireotide °Pentamidine °Phenytoin °Probenecid °Quinolone antibiotics such as ciprofloxacin, levofloxacin, ofloxacin °Some  herbal dietary supplements °Steroid medications such as prednisone or cortisone °Sulfamethoxazole; trimethoprim °Thyroid hormones °Some medications can hide the warning symptoms of low blood sugar (hypoglycemia). You may need to monitor your blood sugar more closely if you are taking one of these medications. These include: °Beta-blockers, often used for high blood pressure or heart problems (examples include atenolol, metoprolol, propranolol) °Clonidine °Guanethidine °Reserpine °This list may not describe all possible interactions. Give your health care provider a list of all the medicines, herbs, non-prescription drugs, or dietary supplements you use. Also tell them if you smoke, drink alcohol, or use illegal drugs. Some items may interact with your medicine. °What should I watch for while using this medication? °Visit your care team for regular checks on your progress. °Drink plenty of fluids while taking this medication. Check with your care team if you get an attack of severe diarrhea, nausea, and vomiting. The loss of too much body fluid can make it dangerous for you to take this medication. °A test called the HbA1C (A1C) will be monitored. This is a simple blood test. It measures your blood sugar control over the last 2 to 3 months. You will receive this test every 3 to 6 months. °Learn how to check your blood sugar. Learn the symptoms of low and high blood sugar and how to manage them. °Always carry a quick-source of sugar with you in case you have symptoms of low blood sugar. Examples include hard sugar candy or glucose tablets. Make sure others know that you can choke if you eat or drink when you develop serious symptoms of low blood sugar, such as seizures or unconsciousness. They must get medical help at once. °Tell your care team if you have high blood sugar. You might need to change the dose of your medication. If you are sick or exercising more than usual, you might need to change the dose of your  medication. °Do not skip meals. Ask your care team if you should avoid alcohol. Many nonprescription cough and cold products contain sugar or alcohol. These can affect blood sugar. °Pens should never be shared. Even if the needle is changed, sharing may result in passing of viruses like hepatitis or HIV. °Wear a medical ID bracelet or chain, and carry a card that describes your disease and details of your medication and dosage times. °Do not become pregnant while taking this medication. Women should inform their care team if they wish to become pregnant or think they might be pregnant. There is a potential for serious side effects to an unborn child. Talk to your care team for more information. °What side effects may I notice from receiving this medication? °Side effects that you should report to your care team as soon as possible: °Allergic reactions--skin rash, itching, hives, swelling of the face, lips, tongue, or throat °Change in vision °Dehydration--increased thirst, dry mouth, feeling faint or lightheaded, headache, dark yellow or brown urine °Gallbladder problems--severe stomach pain, nausea, vomiting, fever °Heart palpitations--rapid, pounding, or irregular heartbeat °Kidney injury--decrease in the amount of urine, swelling of the ankles, hands, or feet °Pancreatitis--severe stomach pain that spreads to your back or gets worse after eating or when touched, fever, nausea, vomiting °Thyroid cancer--new mass or lump in the neck, pain or trouble swallowing, trouble breathing, hoarseness °Side effects that usually do not require medical   attention (report to your care team if they continue or are bothersome): °Diarrhea °Loss of appetite °Nausea °Stomach pain °Vomiting °This list may not describe all possible side effects. Call your doctor for medical advice about side effects. You may report side effects to FDA at 1-800-FDA-1088. °Where should I keep my medication? °Keep out of the reach of children. °Store  unopened pens in a refrigerator between 2 and 8 degrees C (36 and 46 degrees F). Do not freeze. Protect from light and heat. After you first use the pen, it can be stored for 56 days at room temperature between 15 and 30 degrees C (59 and 86 degrees F) or in a refrigerator. Throw away your used pen after 56 days or after the expiration date, whichever comes first. °Do not store your pen with the needle attached. If the needle is left on, medication may leak from the pen. °NOTE: This sheet is a summary. It may not cover all possible information. If you have questions about this medicine, talk to your doctor, pharmacist, or health care provider. °© 2022 [employer]vier/Gold Standard (2021-02-19 00:00:00) ° °

## 2021-12-09 ENCOUNTER — Ambulatory Visit: Payer: 59 | Admitting: Adult Health

## 2021-12-09 NOTE — Progress Notes (Signed)
Patient no show appointment. ? ?

## 2022-01-08 ENCOUNTER — Ambulatory Visit (HOSPITAL_COMMUNITY)
Admission: EM | Admit: 2022-01-08 | Discharge: 2022-01-08 | Disposition: A | Discharge status: Home / Self Care | Payer: 59 | Attending: Physician Assistant | Admitting: Physician Assistant

## 2022-01-08 ENCOUNTER — Other Ambulatory Visit: Payer: Self-pay

## 2022-01-08 ENCOUNTER — Encounter (HOSPITAL_COMMUNITY): Payer: Self-pay

## 2022-01-08 DIAGNOSIS — H66002 Acute suppurative otitis media without spontaneous rupture of ear drum, left ear: Secondary | ICD-10-CM | POA: Diagnosis not present

## 2022-01-08 MED ORDER — AZITHROMYCIN 250 MG PO TABS: 250.0000 mg | ORAL_TABLET | Freq: Every day | ORAL | 0 refills | Status: DC

## 2022-01-08 NOTE — Discharge Instructions (Signed)
We are treating you for an ear infection.  Take azithromycin as prescribed.  Use Tylenol and ibuprofen for pain.  Use Mucinex and Flonase for cough and congestion.  Make sure you are drinking plenty of fluid.  If your symptoms are not improving by next week please return here or see your PCP.  If you have any worsening symptoms including high fever, chest pain, shortness of breath, nausea/vomiting interfering with oral intake you should be seen in the emergency room.

## 2022-01-08 NOTE — ED Triage Notes (Signed)
Pt presents with left ear pressure, congestion, and loss of voice for 2-3 days.

## 2022-01-08 NOTE — ED Provider Notes (Signed)
[facility]    CSN: [id number] Arrival date & time: 01/08/22  1853      History   Chief Complaint Chief Complaint  Patient presents with   ear pressure    left    HPI Frances Lamb is a 26 y.o. female.   Patient presents today with a 5-day history of URI symptoms that have persisted.  She reports nasal congestion, cough, episode of fever that has since resolved, nausea/vomiting but this has improved as well.  She reports her primary concern today is that she has had pressure and pain in her left ear.  Pain is rated 8 on a 0-10 pain scale, localized to left ear with radiation to throat, described as sharp, worse with swallowing, no alleviating factors identified.  She has been using NyQuil, DayQuil, over-the-counter medications without improvement of symptoms.  Denies any known sick contacts.  She has had COVID multiple times in the past with last episode over a year ago.  She is confident that symptoms are not COVID.  She is confident she is not pregnant.  She does smoke but denies history of allergies, asthma, COPD.  Denies any recent antibiotic use.   Past Medical History:  Diagnosis Date   ADHD (attention deficit hyperactivity disorder)    Anxiety    Bipolar 2 disorder ([place])    Depression    Gestational diabetes    Hypertension    Migraine    Miscarriage     Patient Active Problem List   Diagnosis Date Noted   Bipolar disorder, in full remission, most recent episode mixed ([place]) 11/11/2020   Sexual dysfunction 10/14/2020   Bipolar disorder, in full remission, most recent episode depressed ([place]) 07/07/2020   Insomnia due to mental disorder 03/04/2020   High risk medication use 02/05/2020   Bipolar 2 disorder, major depressive episode ([place]) 01/10/2020   Social anxiety disorder 01/10/2020   S/P cesarean section 11/14/2019   Delivery by cesarean section using transverse incision of lower segment of uterus 10/28/2019   Bipolar 2 disorder ([place]) 03/14/2019    Obesity, Class III, BMI 40-49.9 (morbid obesity) ([place]) 03/14/2019   GAD (generalized anxiety disorder) 04/18/2018    Past Surgical History:  Procedure Laterality Date   ANKLE ARTHROSCOPY Left 05/21/2021   Procedure: ANKLE ARTHROSCOPY- OCD Repair, Debridement; Extensive;  Surgeon: Caroline More, DPM;  Location: [facility];  Service: Podiatry;  Laterality: Left;   ANKLE RECONSTRUCTION Left 05/21/2021   Procedure: RECONSTRUCTION ANKLE- Brostrum-Gould x2;  Surgeon: Caroline More, DPM;  Location: [facility];  Service: Podiatry;  Laterality: Left;  Choice, pre-op pop/saph block   CESAREAN SECTION N/A 10/28/2019   Procedure: CESAREAN SECTION;  Surgeon: Gae Dry, MD;  Location: [facility];  Service: Obstetrics;  Laterality: N/A;   TONSILLECTOMY AND ADENOIDECTOMY      OB History     Gravida  2   Para  1   Term  1   Preterm      AB  1   Living  1      SAB  1   IAB      Ectopic      Multiple  0   Live Births  1            Home Medications    Prior to Admission medications   Medication Sig Start Date End Date Taking? Authorizing Provider  azithromycin (ZITHROMAX) 250 MG tablet Take 1 tablet (250 mg total) by mouth daily. Take first 2 tablets  together, then 1 every day until finished. 01/08/22  Yes Antero Derosia, Parys K, PA-C  albuterol (PROVENTIL) (2.5 MG/3ML) 0.083% nebulizer solution Inhale into the lungs. Patient not taking: Reported on 01/08/2022 07/02/20 07/02/21  [provider]  albuterol (VENTOLIN HFA) 108 (90 Base) MCG/ACT inhaler SMARTSIG:2 Inhalation Via Inhaler Every 4 Hours PRN Patient not taking: Reported on 01/08/2022 06/25/20   [provider]  amphetamine-dextroamphetamine (ADDERALL) 30 MG tablet Take 1 tablet by mouth daily. 11/06/21   Mozingo, Berdie Ogren, NP  ARIPiprazole (ABILIFY) 5 MG tablet Take one tablet daily. 10/01/21   Mozingo, Berdie Ogren, NP  buPROPion (WELLBUTRIN XL) 150 MG 24 hr tablet Take one tablet every  morning x 7 days, then take two tablets every morning. 10/01/21   Mozingo, Berdie Ogren, NP  LamoTRIgine 250 MG TB24 24 hour tablet Take 1 tablet by mouth daily. 08/11/21   Mozingo, Berdie Ogren, NP  lisdexamfetamine (VYVANSE) 50 MG capsule Take 1 capsule (50 mg total) by mouth daily. 11/06/21   Mozingo, Berdie Ogren, NP  norgestimate-ethinyl estradiol (ORTHO-CYCLEN) 0.25-35 MG-MCG tablet TAKE 1 TABLET BY MOUTH EVERY DAY 06/25/21   Gae Dry, MD  ondansetron (ZOFRAN) 4 MG tablet Take 1 tablet (4 mg total) by mouth every 8 (eight) hours as needed for nausea or vomiting. 05/21/21   Caroline More, DPM  QUEtiapine (SEROQUEL) 200 MG tablet Take 1 tablet (200 mg total) by mouth at bedtime. 08/11/21   Mozingo, Berdie Ogren, NP  Semaglutide,0.25 or 0.5MG /DOS, (OZEMPIC, 0.25 OR 0.5 MG/DOSE,) 2 MG/1.5ML SOPN Inject 0.5 mg into the skin once a week. 11/11/21   Gae Dry, MD    Family History Family History  Problem Relation Age of Onset   Diabetes Mother    Bipolar disorder Mother    Anxiety disorder Mother    Depression Mother    OCD Mother    Diabetes Maternal Grandfather     Social History Social History   Tobacco Use   Smoking status: Former    Types: Cigarettes    Quit date: 11/29/2012    Years since quitting: 9.1   Smokeless tobacco: Never   Tobacco comments:    current vaping weekly  Vaping Use   Vaping Use: Every day  Substance Use Topics   Alcohol use: Yes    Alcohol/week: 4.0 standard drinks    Types: 2 Glasses of wine, 2 Standard drinks or equivalent per week   Drug use: Never     Allergies   Cephalexin   Review of Systems Review of Systems  Constitutional:  Positive for activity change. Negative for appetite change, fatigue and fever.  HENT:  Positive for congestion, ear pain and sore throat. Negative for sinus pressure and sneezing.   Respiratory:  Positive for cough. Negative for shortness of breath.   Cardiovascular:  Negative for chest pain.   Gastrointestinal:  Negative for abdominal pain, diarrhea, nausea and vomiting.  Neurological:  Negative for dizziness, light-headedness and headaches.    Physical Exam Triage Vital Signs ED Triage Vitals  Enc Vitals Group     BP 01/08/22 1911 (!) 121/54     Pulse Rate 01/08/22 1911 90     Resp 01/08/22 1911 14     Temp 01/08/22 1911 98.1 F (36.7 C)     Temp Source 01/08/22 1911 Oral     SpO2 01/08/22 1911 98 %     Weight --      Height --      Head Circumference --  Peak Flow --      Pain Score 01/08/22 1913 8     Pain Loc --      Pain Edu? --      Excl. in [place]? --    No data found.  Updated Vital Signs BP (!) 121/54 (BP Location: Left Arm)    Pulse 90    Temp 98.1 F (36.7 C) (Oral)    Resp 14    SpO2 98%   Visual Acuity Right Eye Distance:   Left Eye Distance:   Bilateral Distance:    Right Eye Near:   Left Eye Near:    Bilateral Near:     Physical Exam Vitals reviewed.  Constitutional:      General: She is awake. She is not in acute distress.    Appearance: Normal appearance. She is well-developed. She is not ill-appearing.     Comments: Very pleasant female appears stated age in no acute distress sitting comfortably in exam room  HENT:     Head: Normocephalic and atraumatic.     Right Ear: Ear canal and external ear normal. Tympanic membrane is injected. Tympanic membrane is not erythematous or bulging.     Left Ear: Ear canal and external ear normal. Tympanic membrane is erythematous and bulging.     Nose:     Right Sinus: No maxillary sinus tenderness or frontal sinus tenderness.     Left Sinus: No maxillary sinus tenderness or frontal sinus tenderness.     Mouth/Throat:     Pharynx: Uvula midline. Posterior oropharyngeal erythema present. No oropharyngeal exudate.  Cardiovascular:     Rate and Rhythm: Normal rate and regular rhythm.     Heart sounds: Normal heart sounds, S1 normal and S2 normal. No murmur heard. Pulmonary:     Effort: Pulmonary  effort is normal.     Breath sounds: Normal breath sounds. No wheezing, rhonchi or rales.     Comments: Clear to auscultation bilaterally Psychiatric:        Behavior: Behavior is cooperative.     UC Treatments / Results  Labs (all labs ordered are listed, but only abnormal results are displayed) Labs Reviewed - No data to display  EKG   Radiology No results found.  Procedures Procedures (including critical care time)  Medications Ordered in UC Medications - No data to display  Initial Impression / Assessment and Plan / UC Course  I have reviewed the triage vital signs and the nursing notes.  Pertinent labs & imaging results that were available during my care of the patient were reviewed by me and considered in my medical decision making (see chart for details).     No indication for viral testing given patient has been symptomatic for 5 days and this would not change management.  Otitis media treated with azithromycin given reaction to cephalexin.  Recommended she use over-the-counter medications including Tylenol and ibuprofen for pain and Mucinex and Flonase for congestion.  She is to rest and drink plenty of fluid.  Discussed that if symptoms are not improving by next week she should follow-up with our clinic or her PCP.  Discussed alarm symptoms that warrant emergent evaluation including fever, cough, shortness of breath, nausea/vomiting interfering with oral intake, chest pain.  Strict return precautions given to which she expressed understanding.  Final Clinical Impressions(s) / UC Diagnoses   Final diagnoses:  Non-recurrent acute suppurative otitis media of left ear without spontaneous rupture of tympanic membrane     Discharge Instructions  We are treating you for an ear infection.  Take azithromycin as prescribed.  Use Tylenol and ibuprofen for pain.  Use Mucinex and Flonase for cough and congestion.  Make sure you are drinking plenty of fluid.  If your  symptoms are not improving by next week please return here or see your PCP.  If you have any worsening symptoms including high fever, chest pain, shortness of breath, nausea/vomiting interfering with oral intake you should be seen in the emergency room.     ED Prescriptions     Medication Sig Dispense Auth. Provider   azithromycin (ZITHROMAX) 250 MG tablet Take 1 tablet (250 mg total) by mouth daily. Take first 2 tablets together, then 1 every day until finished. 6 tablet Smt. Loder, Derry Skill, PA-C      PDMP not reviewed this encounter.   Dalesha, Mew, PA-C 01/08/22 1939

## 2022-01-11 ENCOUNTER — Other Ambulatory Visit: Payer: Self-pay

## 2022-01-11 ENCOUNTER — Telehealth: Payer: Self-pay | Admitting: Adult Health

## 2022-01-11 DIAGNOSIS — F909 Attention-deficit hyperactivity disorder, unspecified type: Secondary | ICD-10-CM

## 2022-01-11 NOTE — Telephone Encounter (Signed)
Has she tried another pharmacy? Some of them are getting back in stock.

## 2022-01-11 NOTE — Telephone Encounter (Signed)
Please see message from patient. Patient is on 30 mg of Adderall tablets. I don't think there is an Adzenys substitute for IR. Please advise.

## 2022-01-11 NOTE — Telephone Encounter (Signed)
Look at last note - Adderall incorrect.

## 2022-01-11 NOTE — Telephone Encounter (Signed)
[facility] called to state that they are totally out of Vyvanse and Adderall. They're asking if Atiyana can be put on something else or call meds to another pharmacy.   [id number], [place] - [address]  Phone:  [phone]  Fax:  [phone]    Maylynn's phone number is [phone].

## 2022-01-11 NOTE — Telephone Encounter (Signed)
Patient has an appt with Gina tomorrow.  ?

## 2022-01-12 ENCOUNTER — Encounter: Payer: Self-pay | Admitting: Adult Health

## 2022-01-12 ENCOUNTER — Ambulatory Visit: Payer: 59 | Admitting: Adult Health

## 2022-01-12 ENCOUNTER — Other Ambulatory Visit: Payer: Self-pay

## 2022-01-12 DIAGNOSIS — F411 Generalized anxiety disorder: Secondary | ICD-10-CM

## 2022-01-12 DIAGNOSIS — F3181 Bipolar II disorder: Secondary | ICD-10-CM

## 2022-01-12 DIAGNOSIS — F909 Attention-deficit hyperactivity disorder, unspecified type: Secondary | ICD-10-CM | POA: Diagnosis not present

## 2022-01-12 DIAGNOSIS — G4700 Insomnia, unspecified: Secondary | ICD-10-CM

## 2022-01-12 DIAGNOSIS — F331 Major depressive disorder, recurrent, moderate: Secondary | ICD-10-CM

## 2022-01-12 MED ORDER — LAMOTRIGINE ER 250 MG PO TB24: 1.0000 | ORAL_TABLET | Freq: Every day | ORAL | 5 refills | Status: DC

## 2022-01-12 MED ORDER — LISDEXAMFETAMINE DIMESYLATE 50 MG PO CAPS: 50.0000 mg | ORAL_CAPSULE | Freq: Every day | ORAL | 0 refills | Status: DC

## 2022-01-12 MED ORDER — ARIPIPRAZOLE 5 MG PO TABS: ORAL_TABLET | ORAL | 5 refills | Status: DC

## 2022-01-12 MED ORDER — QUETIAPINE FUMARATE 200 MG PO TABS: 200.0000 mg | ORAL_TABLET | Freq: Every day | ORAL | 5 refills | Status: DC

## 2022-01-12 MED ORDER — BUPROPION HCL ER (XL) 300 MG PO TB24: ORAL_TABLET | ORAL | 5 refills | Status: DC

## 2022-01-12 MED ORDER — AMPHETAMINE-DEXTROAMPHETAMINE 30 MG PO TABS: 30.0000 mg | ORAL_TABLET | Freq: Every day | ORAL | 0 refills | Status: DC

## 2022-01-12 NOTE — Progress Notes (Signed)
Frances Lamb [id number] Jun 20, 1996 26 y.o.  Subjective:   Patient ID:  Frances Lamb is a 26 y.o. (DOB [birth year]) female.  Chief Complaint: No chief complaint on file.   HPI Frances Lamb presents to the office today for follow-up of ADD, GAD, ADHD, BPD2, and insomnia.  Describes mood today as "ok". Pleasant. Mood symptoms - denies depression, anxiety, and irritability. Denies worry and rumination. Denies recent panic attacks. Denies mood swings. Stating "I'm doing a lot better". Feels like current medication regimen are working well. Family doing well. Stable interest and motivation. Taking medications as prescribed.  Energy levels good. Active, does not have a regular exercise routine.  Enjoys some usual interests and activities. Married - 3 years. Lives with husband and 77 years old. Spending time with family Appetite adequate. Weight stable - 252 pounds. Started on Ozempic. Sleeping well most nights. Averages 8 hours. Focus and concentration improved. Managing aspects of household. 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  Denies AH or VH.  Previous medication trials: Lamictal, Cymbalta, Latuda, Hydroxyzine, Adderall, Vyvanse, Seroquel   PHQ2-9    Flowsheet Row Nutrition from 10/10/2019 in [facility]  PHQ-2 Total Score 2  PHQ-9 Total Score 11      Flowsheet Row ED from 01/08/2022 in [facility] Admission (Discharged) from 05/21/2021 in [facility] ED from 03/15/2021 in [facility]  C-SSRS RISK CATEGORY No Risk No Risk No Risk        Review of Systems:  Review of Systems  Musculoskeletal:  Negative for gait problem.  Neurological:  Negative for tremors.  Psychiatric/Behavioral:         Please refer to HPI   Medications: I have reviewed the patient's current medications.  Current Outpatient Medications  Medication Sig Dispense Refill   albuterol  (PROVENTIL) (2.5 MG/3ML) 0.083% nebulizer solution Inhale into the lungs. (Patient not taking: Reported on 01/08/2022)     albuterol (VENTOLIN HFA) 108 (90 Base) MCG/ACT inhaler SMARTSIG:2 Inhalation Via Inhaler Every 4 Hours PRN (Patient not taking: Reported on 01/08/2022)     amphetamine-dextroamphetamine (ADDERALL) 30 MG tablet Take 1 tablet by mouth daily. 30 tablet 0   ARIPiprazole (ABILIFY) 5 MG tablet Take one tablet daily. 30 tablet 5   azithromycin (ZITHROMAX) 250 MG tablet Take 1 tablet (250 mg total) by mouth daily. Take first 2 tablets together, then 1 every day until finished. 6 tablet 0   buPROPion (WELLBUTRIN XL) 300 MG 24 hr tablet Take one tablet every morning. 30 tablet 5   LamoTRIgine 250 MG TB24 24 hour tablet Take 1 tablet by mouth daily. 30 tablet 5   lisdexamfetamine (VYVANSE) 50 MG capsule Take 1 capsule (50 mg total) by mouth daily. 30 capsule 0   norgestimate-ethinyl estradiol (ORTHO-CYCLEN) 0.25-35 MG-MCG tablet TAKE 1 TABLET BY MOUTH EVERY DAY 84 tablet 1   ondansetron (ZOFRAN) 4 MG tablet Take 1 tablet (4 mg total) by mouth every 8 (eight) hours as needed for nausea or vomiting. 20 tablet 0   QUEtiapine (SEROQUEL) 200 MG tablet Take 1 tablet (200 mg total) by mouth at bedtime. 30 tablet 5   Semaglutide,0.25 or 0.5MG /DOS, (OZEMPIC, 0.25 OR 0.5 MG/DOSE,) 2 MG/1.5ML SOPN Inject 0.5 mg into the skin once a week. 1.5 mL 11   No current facility-administered medications for this visit.    Medication Side Effects: None  Allergies:  Allergies  Allergen Reactions  Cephalexin Rash    Past Medical History:  Diagnosis Date   ADHD (attention deficit hyperactivity disorder)    Anxiety    Bipolar 2 disorder (HCC)    Depression    Gestational diabetes    Hypertension    Migraine    Miscarriage     Past Medical History, Surgical history, Social history, and Family history were reviewed and updated as appropriate.   Please see review of systems for further details on  the patient's review from today.   Objective:   Physical Exam:  There were no vitals taken for this visit.  Physical Exam Constitutional:      General: She is not in acute distress. Musculoskeletal:        General: No deformity.  Neurological:     Mental Status: She is alert and oriented to person, place, and time.     Coordination: Coordination normal.  Psychiatric:        Attention and Perception: Attention and perception normal. She does not perceive auditory or visual hallucinations.        Mood and Affect: Mood normal. Mood is not anxious or depressed. Affect is not labile, blunt, angry or inappropriate.        Speech: Speech normal.        Behavior: Behavior normal.        Thought Content: Thought content normal. Thought content is not paranoid or delusional. Thought content does not include homicidal or suicidal ideation. Thought content does not include homicidal or suicidal plan.        Cognition and Memory: Cognition and memory normal.        Judgment: Judgment normal.     Comments: Insight intact    Lab Review:     Component Value Date/Time   NA 137 05/02/2020 1914   NA 138 09/16/2013 2333   K 4.2 05/02/2020 1914   K 3.2 (L) 09/16/2013 2333   CL 102 05/02/2020 1914   CL 109 (H) 09/16/2013 2333   CO2 24 05/02/2020 1914   CO2 22 09/16/2013 2333   GLUCOSE 106 (H) 05/02/2020 1914   GLUCOSE 153 (H) 09/16/2013 2333   BUN 13 05/02/2020 1914   BUN 7 09/16/2013 2333   CREATININE 0.69 05/02/2020 1914   CREATININE 0.66 09/16/2013 2333   CALCIUM 9.3 05/02/2020 1914   CALCIUM 8.8 (L) 09/16/2013 2333   PROT 7.8 05/02/2020 1914   ALBUMIN 4.0 05/02/2020 1914   AST 22 05/02/2020 1914   ALT 17 05/02/2020 1914   ALKPHOS 104 05/02/2020 1914   BILITOT 0.6 05/02/2020 1914   GFRNONAA >60 05/02/2020 1914   GFRNONAA >60 09/16/2013 2333   GFRAA >60 05/02/2020 1914   GFRAA >60 09/16/2013 2333       Component Value Date/Time   WBC 11.0 (H) 05/02/2020 1914   RBC 5.05  05/02/2020 1914   HGB 12.8 05/02/2020 1914   HGB 11.1 08/27/2019 1048   HCT 39.0 05/02/2020 1914   HCT 34.0 08/27/2019 1048   PLT 385 05/02/2020 1914   PLT 276 08/27/2019 1048   MCV 77.2 (L) 05/02/2020 1914   MCV 86 08/27/2019 1048   MCV 84 09/16/2013 2333   MCH 25.3 (L) 05/02/2020 1914   MCHC 32.8 05/02/2020 1914   RDW 15.1 05/02/2020 1914   RDW 13.2 08/27/2019 1048   RDW 13.5 09/16/2013 2333   LYMPHSABS 2.6 08/27/2019 1048   MONOABS 0.8 03/13/2019 0425   EOSABS 0.1 08/27/2019 1048   BASOSABS 0.0 08/27/2019 1048  No results found for: POCLITH, LITHIUM   No results found for: PHENYTOIN, PHENOBARB, VALPROATE, CBMZ   .res Assessment: Plan:    Plan:  PDMP reviewed  1. Lamictal 250mg  daily 2. Wellbutrin XL 300mg  daily. Denies seizure history. 3. Seroquel 200mg  at bedtime 4. Vyvanse 50mg  every morning 5. Abilify 5mg  daily 6. Adderall 10mg  for afternoon use  113/76/93  RTC 4 weeks  Set up with a therapist  Patient advised to contact office with any questions, adverse effects, or acute worsening in signs and symptoms.   Discussed potential benefits, risks, and side effects of stimulants with patient to include increased heart rate, palpitations, insomnia, increased anxiety, increased irritability, or decreased appetite.  Instructed patient to contact office if experiencing any significant tolerability issues.   Counseled patient regarding potential benefits, risks, and side effects of Lamictal to include potential risk of Stevens-Johnson syndrome. Advised patient to stop taking Lamictal and contact office immediately if rash develops and to seek urgent medical attention if rash is severe and/or spreading quickly.  There are no diagnoses linked to this encounter.   Diagnoses and all orders for this visit:  GAD (generalized anxiety disorder)  Bipolar II disorder (HCC) -     ARIPiprazole (ABILIFY) 5 MG tablet; Take one tablet daily. -     LamoTRIgine 250 MG TB24 24  hour tablet; Take 1 tablet by mouth daily. -     QUEtiapine (SEROQUEL) 200 MG tablet; Take 1 tablet (200 mg total) by mouth at bedtime.  Attention deficit hyperactivity disorder (ADHD), unspecified ADHD type -     buPROPion (WELLBUTRIN XL) 300 MG 24 hr tablet; Take one tablet every morning. -     lisdexamfetamine (VYVANSE) 50 MG capsule; Take 1 capsule (50 mg total) by mouth daily. -     amphetamine-dextroamphetamine (ADDERALL) 30 MG tablet; Take 1 tablet by mouth daily.  Insomnia, unspecified type -     QUEtiapine (SEROQUEL) 200 MG tablet; Take 1 tablet (200 mg total) by mouth at bedtime.  Major depressive disorder, recurrent episode, moderate (HCC)     Please see After Visit Summary for patient specific instructions.  Future Appointments  Date Time Provider Department Center  02/12/2022  2:35 PM , MD [facility] None    No orders of the defined types were placed in this encounter.   -------------------------------

## 2022-02-09 ENCOUNTER — Telehealth: Payer: 59 | Admitting: Obstetrics & Gynecology

## 2022-02-12 ENCOUNTER — Ambulatory Visit: Payer: 59 | Admitting: Obstetrics & Gynecology

## 2022-02-28 ENCOUNTER — Other Ambulatory Visit: Payer: Self-pay | Admitting: Obstetrics & Gynecology

## 2022-03-01 ENCOUNTER — Other Ambulatory Visit: Payer: Self-pay | Admitting: Obstetrics & Gynecology

## 2022-03-01 MED ORDER — OZEMPIC (1 MG/DOSE) 4 MG/3ML ~~LOC~~ SOPN: 1.0000 mg | PEN_INJECTOR | SUBCUTANEOUS | 5 refills | Status: DC

## 2022-03-19 ENCOUNTER — Other Ambulatory Visit: Payer: Self-pay | Admitting: Adult Health

## 2022-03-19 ENCOUNTER — Telehealth: Payer: Self-pay | Admitting: Adult Health

## 2022-03-19 DIAGNOSIS — F909 Attention-deficit hyperactivity disorder, unspecified type: Secondary | ICD-10-CM

## 2022-03-19 MED ORDER — LISDEXAMFETAMINE DIMESYLATE 50 MG PO CAPS: 50.0000 mg | ORAL_CAPSULE | Freq: Every day | ORAL | 0 refills | Status: DC

## 2022-03-19 MED ORDER — AMPHETAMINE-DEXTROAMPHETAMINE 10 MG PO TABS: 10.0000 mg | ORAL_TABLET | Freq: Every day | ORAL | 0 refills | Status: DC

## 2022-03-19 NOTE — Telephone Encounter (Signed)
Patient doesn't answer.  ?

## 2022-03-19 NOTE — Telephone Encounter (Signed)
Can we verify the Adderall dose? ?

## 2022-03-19 NOTE — Telephone Encounter (Signed)
Pt would like refill of Vyvanse and Adderall to  ? ?[id number] [address], [place] [address]  ?[place], [place] [address]  ?Phone:  [phone]  Fax:  [phone]  ? ?She has confirmed availability of the Adderall. ? ?Next appt. 4/28 ?

## 2022-03-19 NOTE — Telephone Encounter (Signed)
Scripts sent

## 2022-03-26 ENCOUNTER — Ambulatory Visit: Payer: 59 | Admitting: Adult Health

## 2022-03-26 ENCOUNTER — Encounter: Payer: Self-pay | Admitting: Adult Health

## 2022-03-26 DIAGNOSIS — F909 Attention-deficit hyperactivity disorder, unspecified type: Secondary | ICD-10-CM | POA: Diagnosis not present

## 2022-03-26 DIAGNOSIS — F411 Generalized anxiety disorder: Secondary | ICD-10-CM

## 2022-03-26 DIAGNOSIS — F3181 Bipolar II disorder: Secondary | ICD-10-CM | POA: Diagnosis not present

## 2022-03-26 DIAGNOSIS — F331 Major depressive disorder, recurrent, moderate: Secondary | ICD-10-CM

## 2022-03-26 DIAGNOSIS — G4700 Insomnia, unspecified: Secondary | ICD-10-CM

## 2022-03-26 MED ORDER — LISDEXAMFETAMINE DIMESYLATE 50 MG PO CAPS: 50.0000 mg | ORAL_CAPSULE | Freq: Every day | ORAL | 0 refills | Status: DC

## 2022-03-26 MED ORDER — AMPHETAMINE-DEXTROAMPHETAMINE 10 MG PO TABS: 10.0000 mg | ORAL_TABLET | Freq: Every day | ORAL | 0 refills | Status: DC

## 2022-03-26 MED ORDER — AMPHETAMINE-DEXTROAMPHETAMINE 10 MG PO TABS: ORAL_TABLET | ORAL | 0 refills | Status: DC

## 2022-03-26 NOTE — Progress Notes (Signed)
Frances Nurserin E Veitch ?[id number] ?1996/06/24 ?26 y.o. ? ?Subjective:  ? ?Patient ID:  Frances Lamb is a 26 y.o. (DOB 1996/06/24) female. ? ?Chief Complaint: No chief complaint on file. ? ? ?HPI ?Frances Lamb presents to the office today for follow-up of ADD, GAD, ADHD, BPD2, and insomnia. ? ?Describes mood today as "ok". Pleasant. Mood symptoms - denies depression, anxiety, and irritability. Denies worry and rumination. Denies recent panic attacks. Denies mood swings. Stating "I'm doing good". Feels like current medication regimen are working well. Family doing well. Stable interest and motivation. Taking medications as prescribed.  ?Energy levels good. Active, does not have a regular exercise routine.  ?Enjoys some usual interests and activities. Married. Lives with husband and son. Spending time with family ?Appetite adequate. Weight loss 232 pounds. Started on Ozempic. ?Sleeping well most nights. Averages 8 hours. ?Focus and concentration improved. Managing aspects of household. 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 ?Denies SI or HI.  ?Denies AH or VH. ? ?Previous medication trials: Lamictal, Cymbalta, Latuda, Hydroxyzine, Adderall, Vyvanse, Seroquel ? ? ?PHQ2-9   ? ?Flowsheet Row Nutrition from 10/10/2019 in [facility] [place]  ?PHQ-2 Total Score 2  ?PHQ-9 Total Score 11  ? ?  ? ?Flowsheet Row ED [facility] from [facility] from [facility]15/2021 in [facility]  ?C-SSRS RISK CATEGORY No Risk No Risk No Risk  ? ?  ?  ? ?Review of Systems:  ?Review of Systems  ?Musculoskeletal:  Negative for gait problem.  ?Neurological:  Negative for tremors.  ?Psychiatric/Behavioral:    ?     Please refer to HPI  ? ?Medications: I have reviewed the patient's current medications. ? ?Current Outpatient Medications  ?Medication Sig Dispense Refill  ? [START ON 04/23/2022]  amphetamine-dextroamphetamine (ADDERALL) 10 MG tablet Take 1 tablet (10 mg total) by mouth daily. 30 tablet 0  ? [START ON 05/21/2022] amphetamine-dextroamphetamine (ADDERALL) 10 MG tablet Take one tablet daily. 30 tablet 0  ? [START ON 04/23/2022] lisdexamfetamine (VYVANSE) 50 MG capsule Take 1 capsule (50 mg total) by mouth daily. 30 capsule 0  ? [START ON 05/21/2022] lisdexamfetamine (VYVANSE) 50 MG capsule Take 1 capsule (50 mg total) by mouth daily. 30 capsule 0  ? albuterol (PROVENTIL) (2.5 MG/3ML) 0.083% nebulizer solution Inhale into the lungs. (Patient not taking: Reported on 01/08/2022)    ? albuterol (VENTOLIN HFA) 108 (90 Base) MCG/ACT inhaler SMARTSIG:2 Inhalation Via Inhaler Every 4 Hours PRN (Patient not taking: Reported on 01/08/2022)    ? amphetamine-dextroamphetamine (ADDERALL) 10 MG tablet Take 1 tablet (10 mg total) by mouth daily. 30 tablet 0  ? amphetamine-dextroamphetamine (ADDERALL) 30 MG tablet Take 1 tablet by mouth daily. 30 tablet 0  ? ARIPiprazole (ABILIFY) 5 MG tablet Take one tablet daily. 30 tablet 5  ? azithromycin (ZITHROMAX) 250 MG tablet Take 1 tablet (250 mg total) by mouth daily. Take first 2 tablets together, then 1 every day until finished. 6 tablet 0  ? buPROPion (WELLBUTRIN XL) 300 MG 24 hr tablet Take one tablet every morning. 30 tablet 5  ? LamoTRIgine 250 MG TB24 24 hour tablet Take 1 tablet by mouth daily. 30 tablet 5  ? lisdexamfetamine (VYVANSE) 50 MG capsule Take 1 capsule (50 mg total) by mouth daily. 30 capsule 0  ? norgestimate-ethinyl estradiol (ORTHO-CYCLEN) 0.25-35 MG-MCG tablet TAKE 1 TABLET BY MOUTH EVERY DAY 84 tablet 1  ? ondansetron (ZOFRAN)  4 MG tablet Take 1 tablet (4 mg total) by mouth every 8 (eight) hours as needed for nausea or vomiting. 20 tablet 0  ? QUEtiapine (SEROQUEL) 200 MG tablet Take 1 tablet (200 mg total) by mouth at bedtime. 30 tablet 5  ? Semaglutide, 1 MG/DOSE, (OZEMPIC, 1 MG/DOSE,) 4 MG/3ML SOPN Inject 1 mg into the skin once a week. 3 mL 5   ? ?No current facility-administered medications for this visit.  ? ? ?Medication Side Effects: None ? ?Allergies:  ?Allergies  ?Allergen Reactions  ? Cephalexin Rash  ? ? ?Past Medical History:  ?Diagnosis Date  ? ADHD (attention deficit hyperactivity disorder)   ? Anxiety   ? Bipolar 2 disorder (HCC)   ? Depression   ? Gestational diabetes   ? Hypertension   ? Migraine   ? Miscarriage   ? ? ?Past Medical History, Surgical history, Social history, and Family history were reviewed and updated as appropriate.  ? ?Please see review of systems for further details on the patient's review from today.  ? ?Objective:  ? ?Physical Exam:  ?There were no vitals taken for this visit. ? ?Physical Exam ?Constitutional:   ?   General: She is not in acute distress. ?Musculoskeletal:     ?   General: No deformity.  ?Neurological:  ?   Mental Status: She is alert and oriented to person, place, and time.  ?   Coordination: Coordination normal.  ?Psychiatric:     ?   Attention and Perception: Attention and perception normal. She does not perceive auditory or visual hallucinations.     ?   Mood and Affect: Mood normal. Mood is not anxious or depressed. Affect is not labile, blunt, angry or inappropriate.     ?   Speech: Speech normal.     ?   Behavior: Behavior normal.     ?   Thought Content: Thought content normal. Thought content is not paranoid or delusional. Thought content does not include homicidal or suicidal ideation. Thought content does not include homicidal or suicidal plan.     ?   Cognition and Memory: Cognition and memory normal.     ?   Judgment: Judgment normal.  ?   Comments: Insight intact  ? ? ?Lab Review:  ?   ?Component Value Date/Time  ? NA 137 05/02/2020 1914  ? NA 138 09/16/2013 2333  ? K 4.2 05/02/2020 1914  ? K 3.2 (L) 09/16/2013 2333  ? CL 102 05/02/2020 1914  ? CL 109 (H) 09/16/2013 2333  ? CO2 24 05/02/2020 1914  ? CO2 22 09/16/2013 2333  ? GLUCOSE 106 (H) 05/02/2020 1914  ? GLUCOSE 153 (H) 09/16/2013 2333   ? BUN 13 05/02/2020 1914  ? BUN 7 09/16/2013 2333  ? CREATININE 0.69 05/02/2020 1914  ? CREATININE 0.66 09/16/2013 2333  ? CALCIUM 9.3 05/02/2020 1914  ? CALCIUM 8.8 (L) 09/16/2013 2333  ? PROT 7.8 05/02/2020 1914  ? ALBUMIN 4.0 05/02/2020 1914  ? AST 22 05/02/2020 1914  ? ALT 17 05/02/2020 1914  ? [facility] 104 05/02/2020 1914  ? BILITOT 0.6 05/02/2020 1914  ? GFRNONAA >60 05/02/2020 1914  ? GFRNONAA >60 09/16/2013 2333  ? GFRAA >60 05/02/2020 1914  ? GFRAA >60 09/16/2013 2333  ? ? ?   ?Component Value Date/Time  ? WBC 11.0 (H) 05/02/2020 1914  ? RBC 5.05 05/02/2020 1914  ? HGB 12.8 05/02/2020 1914  ? HGB 11.1 08/27/2019 1048  ? HCT 39.0 05/02/2020 1914  ?  HCT 34.0 08/27/2019 1048  ? PLT 385 05/02/2020 1914  ? PLT 276 08/27/2019 1048  ? MCV 77.2 (L) 05/02/2020 1914  ? MCV 86 08/27/2019 1048  ? MCV 84 09/16/2013 2333  ? MCH 25.3 (L) 05/02/2020 1914  ? MCHC 32.8 05/02/2020 1914  ? RDW 15.1 05/02/2020 1914  ? RDW 13.2 08/27/2019 1048  ? RDW 13.5 09/16/2013 2333  ? LYMPHSABS 2.6 08/27/2019 1048  ? MONOABS 0.8 03/13/2019 0425  ? EOSABS 0.1 08/27/2019 1048  ? BASOSABS 0.0 08/27/2019 1048  ? ? ?No results found for: POCLITH, LITHIUM  ? ?No results found for: PHENYTOIN, PHENOBARB, VALPROATE, CBMZ  ? ?.res ?Assessment: Plan:   ? ?Plan: ? ?PDMP reviewed ? ?1. Lamictal 250mg  daily ?2. Wellbutrin XL 300mg  daily. Denies seizure history. ?3. Seroquel 200mg  at bedtime ?4. Vyvanse 50mg  every morning ?5. Abilify 5mg  daily ?6. Adderall 10mg  for afternoon use ? ?130/74/91 ? ?RTC 4 weeks ? ?Set up with a therapist ? ?Patient advised to contact office with any questions, adverse effects, or acute worsening in signs and symptoms. ?  ?Discussed potential benefits, risks, and side effects of stimulants with patient to include increased heart rate, palpitations, insomnia, increased anxiety, increased irritability, or decreased appetite.  Instructed patient to contact office if experiencing any significant tolerability issues.  ? ?Counseled  patient regarding potential benefits, risks, and side effects of Lamictal to include potential risk of Stevens-Johnson syndrome. Advised patient to stop taking Lamictal and contact office immediately if rash develops and to se

## 2022-04-09 ENCOUNTER — Ambulatory Visit: Payer: 59 | Admitting: Adult Health

## 2022-05-04 ENCOUNTER — Encounter: Payer: Self-pay | Admitting: Certified Nurse Midwife

## 2022-05-11 ENCOUNTER — Other Ambulatory Visit: Payer: Self-pay | Admitting: Adult Health

## 2022-05-11 DIAGNOSIS — F3181 Bipolar II disorder: Secondary | ICD-10-CM

## 2022-05-11 DIAGNOSIS — G4700 Insomnia, unspecified: Secondary | ICD-10-CM

## 2022-05-18 ENCOUNTER — Telehealth: Payer: Self-pay | Admitting: Adult Health

## 2022-05-18 NOTE — Telephone Encounter (Signed)
Patient has no c/o depression/anxiety, is just irritable and lashing out. She said for a few weeks she has been waking up about every other hour, but is able to go back to sleep fairly quickly. She is compliant with medications, has not changed how/when she is taking. Denies any new stressors. There have been no recent medication changes and she wonders if meds just need to be tweaked. She has an appt the end of July.

## 2022-05-18 NOTE — Telephone Encounter (Signed)
We can move her appointment up to address concerns.

## 2022-05-18 NOTE — Telephone Encounter (Signed)
Based on her schedule and Gina's availability, I was able to move her up to 7/7.  I have also added her to the cancellation list

## 2022-05-18 NOTE — Telephone Encounter (Signed)
Please call patient and see if you can get her an earlier appt with Almira Coaster.

## 2022-05-18 NOTE — Telephone Encounter (Signed)
Pt says she is having issues with being more irritable and lashing out.  She thinks it's from one or several of the medicines since she doesn't have anything else going on right now.  She has lost 60 lbs.  Next appt 7/27

## 2022-06-04 ENCOUNTER — Ambulatory Visit (INDEPENDENT_AMBULATORY_CARE_PROVIDER_SITE_OTHER): Payer: 59 | Admitting: Adult Health

## 2022-06-04 ENCOUNTER — Encounter: Payer: Self-pay | Admitting: Adult Health

## 2022-06-04 DIAGNOSIS — G4700 Insomnia, unspecified: Secondary | ICD-10-CM

## 2022-06-04 DIAGNOSIS — F3181 Bipolar II disorder: Secondary | ICD-10-CM

## 2022-06-04 DIAGNOSIS — F909 Attention-deficit hyperactivity disorder, unspecified type: Secondary | ICD-10-CM | POA: Diagnosis not present

## 2022-06-04 DIAGNOSIS — F411 Generalized anxiety disorder: Secondary | ICD-10-CM

## 2022-06-04 MED ORDER — LAMOTRIGINE ER 250 MG PO TB24: 1.0000 | ORAL_TABLET | Freq: Every day | ORAL | 5 refills | Status: DC

## 2022-06-04 MED ORDER — BUPROPION HCL ER (XL) 300 MG PO TB24: ORAL_TABLET | ORAL | 5 refills | Status: DC

## 2022-06-04 MED ORDER — LISDEXAMFETAMINE DIMESYLATE 50 MG PO CAPS: 50.0000 mg | ORAL_CAPSULE | Freq: Every day | ORAL | 0 refills | Status: DC

## 2022-06-04 MED ORDER — AMPHETAMINE-DEXTROAMPHETAMINE 10 MG PO TABS: ORAL_TABLET | ORAL | 0 refills | Status: DC

## 2022-06-04 MED ORDER — QUETIAPINE FUMARATE 300 MG PO TABS: 300.0000 mg | ORAL_TABLET | Freq: Every day | ORAL | 5 refills | Status: DC

## 2022-06-04 MED ORDER — ARIPIPRAZOLE 5 MG PO TABS: ORAL_TABLET | ORAL | 5 refills | Status: DC

## 2022-06-04 MED ORDER — AMPHETAMINE-DEXTROAMPHETAMINE 10 MG PO TABS: 10.0000 mg | ORAL_TABLET | Freq: Every day | ORAL | 0 refills | Status: DC

## 2022-06-04 NOTE — Progress Notes (Signed)
Frances Lamb [id number] [birth year] 26 y.o.  Subjective:   Patient ID:  Frances Lamb is a 26 y.o. (DOB March 02, 1996) female.  Chief Complaint: No chief complaint on file.   HPI Frances Lamb presents to the office today for follow-up of ADD, GAD, ADHD, BPD2, and insomnia.  Describes mood today as "so-so". Pleasant. Denies tearfulness. Mood symptoms - denies depression. Feels anxious and irritable. Denies worry and rumination. Denies recent panic attacks. Reports mood swings. Making impulsive decisions - spending more money - cancelling appointments she should be going to. Stating "I'm doing doing as well as I was". Unable to identify any precipitants. Husband concerned. Feels like current medication regimen is not working as well. Family doing well. Stable interest and motivation. Taking medications as prescribed.  Energy levels good. Active, does not have a regular exercise routine.  Enjoys some usual interests and activities. Married. Lives with husband and son. Spending time with family Appetite adequate - eating 3 times a day. Weight loss 225 from 232 - starting weight 285 pounds. Started on Ozempic. Sleeping better some nights than others. Able to get to sleep, but is waking up every other hour.  Focus and concentration improved. Managing aspects of household. 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  Denies AH or VH. Denies self harm. Denies substance use.    Previous medication trials: Lamictal, Cymbalta, Latuda, Hydroxyzine, Adderall, Vyvanse, Seroquel   PHQ2-9    Flowsheet Row Nutrition from 10/10/2019 in [facility] [place]  PHQ-2 Total Score 2  PHQ-9 Total Score 11      Flowsheet Row ED from 01/08/2022 in [facility] Admission (Discharged) from 05/21/2021 in [facility] ED from 03/15/2021 in [facility]  C-SSRS RISK CATEGORY No Risk No Risk No Risk        Review  of Systems:  Review of Systems  Musculoskeletal:  Negative for gait problem.  Neurological:  Negative for tremors.  Psychiatric/Behavioral:         Please refer to HPI    Medications: I have reviewed the patient's current medications.  Current Outpatient Medications  Medication Sig Dispense Refill   albuterol (PROVENTIL) (2.5 MG/3ML) 0.083% nebulizer solution Inhale into the lungs. (Patient not taking: Reported on 01/08/2022)     albuterol (VENTOLIN HFA) 108 (90 Base) MCG/ACT inhaler SMARTSIG:2 Inhalation Via Inhaler Every 4 Hours PRN (Patient not taking: Reported on 01/08/2022)     amphetamine-dextroamphetamine (ADDERALL) 10 MG tablet Take 1 tablet (10 mg total) by mouth daily. 30 tablet 0   amphetamine-dextroamphetamine (ADDERALL) 10 MG tablet Take 1 tablet (10 mg total) by mouth daily. 30 tablet 0   amphetamine-dextroamphetamine (ADDERALL) 10 MG tablet Take one tablet daily. 30 tablet 0   amphetamine-dextroamphetamine (ADDERALL) 30 MG tablet Take 1 tablet by mouth daily. 30 tablet 0   ARIPiprazole (ABILIFY) 5 MG tablet Take one tablet daily. 30 tablet 5   azithromycin (ZITHROMAX) 250 MG tablet Take 1 tablet (250 mg total) by mouth daily. Take first 2 tablets together, then 1 every day until finished. 6 tablet 0   buPROPion (WELLBUTRIN XL) 300 MG 24 hr tablet Take one tablet every morning. 30 tablet 5   LamoTRIgine 250 MG TB24 24 hour tablet Take 1 tablet by mouth daily. 30 tablet 5   lisdexamfetamine (VYVANSE) 50 MG capsule Take 1 capsule (50 mg total) by mouth daily. 30 capsule 0   lisdexamfetamine (VYVANSE) 50 MG capsule  Take 1 capsule (50 mg total) by mouth daily. 30 capsule 0   lisdexamfetamine (VYVANSE) 50 MG capsule Take 1 capsule (50 mg total) by mouth daily. 30 capsule 0   norgestimate-ethinyl estradiol (ORTHO-CYCLEN) 0.25-35 MG-MCG tablet TAKE 1 TABLET BY MOUTH EVERY DAY 84 tablet 1   ondansetron (ZOFRAN) 4 MG tablet Take 1 tablet (4 mg total) by mouth every 8 (eight) hours as  needed for nausea or vomiting. 20 tablet 0   QUEtiapine (SEROQUEL) 200 MG tablet TAKE 1 TABLET BY MOUTH AT BEDTIME. 90 tablet 0   Semaglutide, 1 MG/DOSE, (OZEMPIC, 1 MG/DOSE,) 4 MG/3ML SOPN Inject 1 mg into the skin once a week. 3 mL 5   No current facility-administered medications for this visit.    Medication Side Effects: None  Allergies:  Allergies  Allergen Reactions   Cephalexin Rash    Past Medical History:  Diagnosis Date   ADHD (attention deficit hyperactivity disorder)    Anxiety    Bipolar 2 disorder (HCC)    Depression    Gestational diabetes    Hypertension    Migraine    Miscarriage     Past Medical History, Surgical history, Social history, and Family history were reviewed and updated as appropriate.   Please see review of systems for further details on the patient's review from today.   Objective:   Physical Exam:  There were no vitals taken for this visit.  Physical Exam Constitutional:      General: She is not in acute distress. Musculoskeletal:        General: No deformity.  Neurological:     Mental Status: She is alert and oriented to person, place, and time.     Coordination: Coordination normal.  Psychiatric:        Attention and Perception: Attention and perception normal. She does not perceive auditory or visual hallucinations.        Mood and Affect: Mood normal. Mood is not anxious or depressed. Affect is not labile, blunt, angry or inappropriate.        Speech: Speech normal.        Behavior: Behavior normal.        Thought Content: Thought content normal. Thought content is not paranoid or delusional. Thought content does not include homicidal or suicidal ideation. Thought content does not include homicidal or suicidal plan.        Cognition and Memory: Cognition and memory normal.        Judgment: Judgment normal.     Comments: Insight intact     Lab Review:     Component Value Date/Time   NA 137 05/02/2020 1914   NA 138  09/16/2013 2333   K 4.2 05/02/2020 1914   K 3.2 (L) 09/16/2013 2333   CL 102 05/02/2020 1914   CL 109 (H) 09/16/2013 2333   CO2 24 05/02/2020 1914   CO2 22 09/16/2013 2333   GLUCOSE 106 (H) 05/02/2020 1914   GLUCOSE 153 (H) 09/16/2013 2333   BUN 13 05/02/2020 1914   BUN 7 09/16/2013 2333   CREATININE 0.69 05/02/2020 1914   CREATININE 0.66 09/16/2013 2333   CALCIUM 9.3 05/02/2020 1914   CALCIUM 8.8 (L) 09/16/2013 2333   PROT 7.8 05/02/2020 1914   ALBUMIN 4.0 05/02/2020 1914   AST 22 05/02/2020 1914   ALT 17 05/02/2020 1914   ALKPHOS 104 05/02/2020 1914   BILITOT 0.6 05/02/2020 1914   GFRNONAA >60 05/02/2020 1914   GFRNONAA >60 09/16/2013 2333  GFRAA >60 05/02/2020 1914   GFRAA >60 09/16/2013 2333       Component Value Date/Time   WBC 11.0 (H) 05/02/2020 1914   RBC 5.05 05/02/2020 1914   HGB 12.8 05/02/2020 1914   HGB 11.1 08/27/2019 1048   HCT 39.0 05/02/2020 1914   HCT 34.0 08/27/2019 1048   PLT 385 05/02/2020 1914   PLT 276 08/27/2019 1048   MCV 77.2 (L) 05/02/2020 1914   MCV 86 08/27/2019 1048   MCV 84 09/16/2013 2333   MCH 25.3 (L) 05/02/2020 1914   MCHC 32.8 05/02/2020 1914   RDW 15.1 05/02/2020 1914   RDW 13.2 08/27/2019 1048   RDW 13.5 09/16/2013 2333   LYMPHSABS 2.6 08/27/2019 1048   MONOABS 0.8 03/13/2019 0425   EOSABS 0.1 08/27/2019 1048   BASOSABS 0.0 08/27/2019 1048    No results found for: "POCLITH", "LITHIUM"   No results found for: "PHENYTOIN", "PHENOBARB", "VALPROATE", "CBMZ"   .res Assessment: Plan:    Plan:  PDMP reviewed  1. Lamictal 250mg  daily 2. Wellbutrin XL 300mg  daily. Denies seizure history. 3. Increase Seroquel 200mg  to 300mg  at bedtime 4. Vyvanse 50mg  every morning - taking on work days 5. Abilify 5mg  daily 6. Adderall 10mg  for afternoon use - taking on work days  114/77/89  RTC 3 months  Set up with a therapist  Patient advised to contact office with any questions, adverse effects, or acute worsening in signs and  symptoms.   Discussed potential benefits, risks, and side effects of stimulants with patient to include increased heart rate, palpitations, insomnia, increased anxiety, increased irritability, or decreased appetite.  Instructed patient to contact office if experiencing any significant tolerability issues.   Counseled patient regarding potential benefits, risks, and side effects of Lamictal to include potential risk of Stevens-Johnson syndrome. Advised patient to stop taking Lamictal and contact office immediately if rash develops and to seek urgent medical attention if rash is severe and/or spreading quickly.   There are no diagnoses linked to this encounter.   Please see After Visit Summary for patient specific instructions.  No future appointments.  No orders of the defined types were placed in this encounter.   -------------------------------

## 2022-06-24 ENCOUNTER — Ambulatory Visit: Payer: 59 | Admitting: Adult Health

## 2022-07-05 ENCOUNTER — Other Ambulatory Visit: Payer: Self-pay

## 2022-07-05 ENCOUNTER — Emergency Department: Payer: 59

## 2022-07-05 ENCOUNTER — Emergency Department
Admission: EM | Admit: 2022-07-05 | Discharge: 2022-07-05 | Disposition: A | Discharge status: Home / Self Care | Payer: 59 | Attending: Emergency Medicine | Admitting: Emergency Medicine

## 2022-07-05 DIAGNOSIS — R0789 Other chest pain: Secondary | ICD-10-CM | POA: Diagnosis present

## 2022-07-05 LAB — BASIC METABOLIC PANEL
Anion gap: 7 (ref 5–15)
BUN: 7 mg/dL (ref 6–20)
CO2: 23 mmol/L (ref 22–32)
Calcium: 8.9 mg/dL (ref 8.9–10.3)
Chloride: 107 mmol/L (ref 98–111)
Creatinine, Ser: 0.82 mg/dL (ref 0.44–1.00)
GFR, Estimated: >60 mL/min (ref >60)
Glucose, Bld: 101 mg/dL — ABNORMAL HIGH (ref 70–99)
Potassium: 4 mmol/L (ref 3.5–5.1)
Sodium: 137 mmol/L (ref 135–145)

## 2022-07-05 LAB — CBC WITH DIFFERENTIAL/PLATELET
Abs Immature Granulocytes: 0.02 10*3/uL (ref 0.00–0.07)
Basophils Absolute: 0.1 10*3/uL (ref 0.0–0.1)
Basophils Relative: 1 %
Eosinophils Absolute: 0.1 10*3/uL (ref 0.0–0.5)
Eosinophils Relative: 1 %
HCT: 39.9 % (ref 36.0–46.0)
Hemoglobin: 12.9 g/dL (ref 12.0–15.0)
Immature Granulocytes: 0 %
Lymphocytes Relative: 30 %
Lymphs Abs: 2.8 10*3/uL (ref 0.7–4.0)
MCH: 27.4 pg (ref 26.0–34.0)
MCHC: 32.3 g/dL (ref 30.0–36.0)
MCV: 84.9 fL (ref 80.0–100.0)
Monocytes Absolute: 0.5 10*3/uL (ref 0.1–1.0)
Monocytes Relative: 5 %
Neutro Abs: 6 10*3/uL (ref 1.7–7.7)
Neutrophils Relative %: 63 %
Platelets: 323 10*3/uL (ref 150–400)
RBC: 4.7 MIL/uL (ref 3.87–5.11)
RDW: 13.5 % (ref 11.5–15.5)
WBC: 9.4 10*3/uL (ref 4.0–10.5)
nRBC: 0 % (ref 0.0–0.2)

## 2022-07-05 LAB — TROPONIN I (HIGH SENSITIVITY)
Troponin I (High Sensitivity): <2 ng/L (ref <18)
Troponin I (High Sensitivity): <2 ng/L (ref <18)

## 2022-07-05 LAB — URINALYSIS, ROUTINE W REFLEX MICROSCOPIC
Bilirubin Urine: NEGATIVE
Glucose, UA: NEGATIVE mg/dL
Hgb urine dipstick: NEGATIVE
Ketones, ur: NEGATIVE mg/dL
Leukocytes,Ua: NEGATIVE
Nitrite: NEGATIVE
Protein, ur: NEGATIVE mg/dL
Specific Gravity, Urine: 1.024 (ref 1.005–1.030)
pH: 6 (ref 5.0–8.0)

## 2022-07-05 LAB — POC URINE PREG, ED: Preg Test, Ur: NEGATIVE

## 2022-07-05 LAB — BRAIN NATRIURETIC PEPTIDE: B Natriuretic Peptide: 5.3 pg/mL (ref 0.0–100.0)

## 2022-07-05 MED ORDER — KETOROLAC TROMETHAMINE 15 MG/ML IJ SOLN
15.0000 mg | Freq: Once | INTRAMUSCULAR | Status: AC
  Administered 2022-07-05: 15 mg via INTRAMUSCULAR

## 2022-07-05 MED ORDER — KETOROLAC TROMETHAMINE 15 MG/ML IJ SOLN
15.0000 mg | Freq: Once | INTRAMUSCULAR | Status: DC
  Filled 2022-07-05: qty 1

## 2022-07-05 NOTE — Discharge Instructions (Signed)
Please return for any new, worsening, or change in symptoms other concerns including worsening chest pain, shortness of breath, coughing up blood, lower extremity swelling, or any other concerns.

## 2022-07-05 NOTE — ED Provider Triage Note (Signed)
  Emergency Medicine Provider Triage Evaluation Note  Frances Lamb , a 26 y.o.female,  was evaluated in triage.  Pt complains of chest pain that started approximately 5 hours ago.  Hurts most on the left side.  Reports some shortness of breath as well.  She has a history of enlarged heart.   Review of Systems  Positive: Chest pain, shortness of breath Negative: Denies fever, abdominal pain, vomiting  Physical Exam  There were no vitals filed for this visit. Gen:   Awake, no distress   Resp:  Normal effort  MSK:   Moves extremities without difficulty  Other:    Medical Decision Making  Given the patient's initial medical screening exam, the following diagnostic evaluation has been ordered. The patient will be placed in the appropriate treatment space, once one is available, to complete the evaluation and treatment. I have discussed the plan of care with the patient and I have advised the patient that an ED physician or mid-level practitioner will reevaluate their condition after the test results have been received, as the results may give them additional insight into the type of treatment they may need.    Diagnostics: Labs, EKG, CXR  Treatments: none immediately   Varney Daily, [place] 07/05/22 1713

## 2022-07-05 NOTE — ED Triage Notes (Signed)
Pt to ED via POV from home. Pt reports left side CP that radiates under breast and into back. Pt also endorses SOB, N/V, Pt reports hx of enlarged heart.

## 2022-07-05 NOTE — ED Provider Notes (Signed)
[facility] Provider Note    Event Date/Time   First MD Initiated Contact with Patient 07/05/22 2045     (approximate)   History   Chest Pain   HPI  Frances Lamb is a 26 y.o. female who presents today for evaluation of chest pain.  Patient reports that she started to have pain at approximately noon today.  She reports that it wraps around her left side of her rib cage.  She reports that she has had this before, approximately 2 months ago.  She denies any calf pain or leg swelling.  She takes oral contraceptives but has been on this for approximately 15 years.  No history of PE/DVT in herself or in family members.  She reports that the pain is worse when pushing on her chest.  Patient Active Problem List   Diagnosis Date Noted   Bipolar disorder, in full remission, most recent episode mixed (HCC) 11/11/2020   Sexual dysfunction 10/14/2020   Bipolar disorder, in full remission, most recent episode depressed (HCC) 07/07/2020   Insomnia due to mental disorder 03/04/2020   High risk medication use 02/05/2020   Bipolar 2 disorder, major depressive episode (HCC) 01/10/2020   Social anxiety disorder 01/10/2020   S/P cesarean section 11/14/2019   Delivery by cesarean section using transverse incision of lower segment of uterus 10/28/2019   Bipolar 2 disorder (HCC) 03/14/2019   Obesity, Class III, BMI 40-49.9 (morbid obesity) (HCC) 03/14/2019   GAD (generalized anxiety disorder) 04/18/2018          Physical Exam   Triage Vital Signs: ED Triage Vitals  Enc Vitals Group     BP 07/05/22 1718 130/88     Pulse Rate 07/05/22 1718 99     Resp 07/05/22 1718 18     Temp 07/05/22 1718 98.1 F (36.7 C)     Temp Source 07/05/22 1718 Oral     SpO2 07/05/22 1719 99 %     Weight --      Height --      Head Circumference --      Peak Flow --      Pain Score 07/05/22 1719 6     Pain Loc --      Pain Edu? --      Excl. in GC? --     Most recent vital  signs: Vitals:   07/05/22 1719 07/05/22 2141  BP:  109/69  Pulse:  84  Resp:  17  Temp:  98 F (36.7 C)  SpO2: 99% 96%    Physical Exam Vitals and nursing note reviewed.  Constitutional:      General: Awake and alert. No acute distress.    Appearance: Normal appearance. The patient is overweight.  HENT:     Head: Normocephalic and atraumatic.     Mouth: Mucous membranes are moist.  Eyes:     General: PERRL. Normal EOMs        Right eye: No discharge.        Left eye: No discharge.     Conjunctiva/sclera: Conjunctivae normal.  Cardiovascular:     Rate and Rhythm: Normal rate and regular rhythm.     Pulses: Normal pulses.     Heart sounds: Normal heart sounds Pulmonary:     Effort: Pulmonary effort is normal. No respiratory distress.  Left sided chest wall tenderness.  No rash or skin changes noted    Breath sounds: Normal breath sounds.  Abdominal:  Abdomen is soft. There is no abdominal tenderness. No rebound or guarding. No distention. Musculoskeletal:        General: No swelling. Normal range of motion.     Cervical back: Normal range of motion and neck supple.  Skin:    General: Skin is warm and dry.     Capillary Refill: Capillary refill takes less than 2 seconds.     Findings: No rash.  Neurological:     Mental Status: The patient is awake and alert.      ED Results / Procedures / Treatments   Labs (all labs ordered are listed, but only abnormal results are displayed) Labs Reviewed  BASIC METABOLIC PANEL - Abnormal; Notable for the following components:      Result Value   Glucose, Bld 101 (*)    All other components within normal limits  URINALYSIS, ROUTINE W REFLEX MICROSCOPIC - Abnormal; Notable for the following components:   Color, Urine YELLOW (*)    APPearance HAZY (*)    All other components within normal limits  CBC WITH DIFFERENTIAL/PLATELET  BRAIN NATRIURETIC PEPTIDE  POC URINE PREG, ED  TROPONIN I (HIGH SENSITIVITY)  TROPONIN I (HIGH  SENSITIVITY)     EKG     RADIOLOGY I independently reviewed and interpreted imaging and agree with radiologists findings.     PROCEDURES:  Critical Care performed:   Procedures   MEDICATIONS ORDERED IN ED: Medications  ketorolac (TORADOL) 15 MG/ML injection 15 mg (15 mg Intramuscular Given 07/05/22 2123)     IMPRESSION / MDM / ASSESSMENT AND PLAN / ED COURSE  I reviewed the triage vital signs and the nursing notes.   Differential diagnosis includes, but is not limited to, costochondritis, pneumothorax, pneumonia, pulmonary embolism, MI.  Patient is awake and alert, hemodynamically stable and afebrile.  Her chest pain is easily reproducible with palpation.  Troponin x2 is negative.  She does not appear to be overtly volume overloaded. Wells score 0 for PE.  No pleurisy, shortness of breath, clinical signs or symptoms of DVT.  She was treated with Toradol with resolution of her pain.  We discussed return precautions and the importance of close outpatient follow-up.  Patient understands and agrees with plan.  Discharged in stable condition.   Patient's presentation is most consistent with acute complicated illness / injury requiring diagnostic workup.   Clinical Course as of 07/05/22 2249  Mon Jul 05, 2022  2242 Pain resolved, patient requesting discharge [JP]    Clinical Course User Index [JP] Meredyth Hornung, Herb Grays, PA-C     FINAL CLINICAL IMPRESSION(S) / ED DIAGNOSES   Final diagnoses:  Chest wall pain     Rx / DC Orders   ED Discharge Orders     None        Note:  This document was prepared using Dragon voice recognition software and may include unintentional dictation errors.   Jackelyn Hoehn, PA-C 07/05/22 2250    Sharyn Creamer, MD 07/06/22 [phone]

## 2022-07-31 ENCOUNTER — Encounter (HOSPITAL_COMMUNITY): Payer: Self-pay | Admitting: Emergency Medicine

## 2022-07-31 ENCOUNTER — Other Ambulatory Visit: Payer: Self-pay

## 2022-07-31 ENCOUNTER — Emergency Department (HOSPITAL_COMMUNITY)
Admission: EM | Admit: 2022-07-31 | Discharge: 2022-08-01 | Discharge status: Against Medical Advice | Payer: 59 | Attending: Emergency Medicine | Admitting: Emergency Medicine

## 2022-07-31 ENCOUNTER — Emergency Department (HOSPITAL_COMMUNITY): Payer: 59

## 2022-07-31 DIAGNOSIS — R0789 Other chest pain: Secondary | ICD-10-CM | POA: Insufficient documentation

## 2022-07-31 DIAGNOSIS — Z5321 Procedure and treatment not carried out due to patient leaving prior to being seen by health care provider: Secondary | ICD-10-CM | POA: Diagnosis not present

## 2022-07-31 LAB — BASIC METABOLIC PANEL
Anion gap: 10 (ref 5–15)
BUN: 6 mg/dL (ref 6–20)
CO2: 23 mmol/L (ref 22–32)
Calcium: 9 mg/dL (ref 8.9–10.3)
Chloride: 106 mmol/L (ref 98–111)
Creatinine, Ser: 0.74 mg/dL (ref 0.44–1.00)
GFR, Estimated: >60 mL/min (ref >60)
Glucose, Bld: 100 mg/dL — ABNORMAL HIGH (ref 70–99)
Potassium: 3.7 mmol/L (ref 3.5–5.1)
Sodium: 139 mmol/L (ref 135–145)

## 2022-07-31 LAB — CBC
HCT: 39.2 % (ref 36.0–46.0)
Hemoglobin: 12.8 g/dL (ref 12.0–15.0)
MCH: 28 pg (ref 26.0–34.0)
MCHC: 32.7 g/dL (ref 30.0–36.0)
MCV: 85.8 fL (ref 80.0–100.0)
Platelets: 373 10*3/uL (ref 150–400)
RBC: 4.57 MIL/uL (ref 3.87–5.11)
RDW: 13.9 % (ref 11.5–15.5)
WBC: 12.3 10*3/uL — ABNORMAL HIGH (ref 4.0–10.5)
nRBC: 0 % (ref 0.0–0.2)

## 2022-07-31 LAB — TROPONIN I (HIGH SENSITIVITY)
Troponin I (High Sensitivity): 2 ng/L (ref <18)
Troponin I (High Sensitivity): <2 ng/L (ref <18)

## 2022-07-31 LAB — I-STAT BETA HCG BLOOD, ED (MC, WL, AP ONLY): I-stat hCG, quantitative: <5 m[IU]/mL (ref <5)

## 2022-07-31 NOTE — ED Triage Notes (Signed)
Pt reported to ED with c/o left sided chest pain that radiates into left shoulder and arm. Pt states it feels as if arm has fallen asleep. Has recent hx of the same, has taken ASA 324mg .

## 2022-08-01 NOTE — ED Notes (Signed)
Pt left due to not being seen quick enough 

## 2022-09-07 ENCOUNTER — Ambulatory Visit (INDEPENDENT_AMBULATORY_CARE_PROVIDER_SITE_OTHER): Payer: Self-pay | Admitting: Adult Health

## 2022-09-07 DIAGNOSIS — F489 Nonpsychotic mental disorder, unspecified: Secondary | ICD-10-CM

## 2022-09-07 NOTE — Progress Notes (Signed)
Patient no show appointment. ? ?

## 2022-10-05 ENCOUNTER — Ambulatory Visit
Admission: RE | Admit: 2022-10-05 | Discharge: 2022-10-05 | Disposition: A | Discharge status: Home / Self Care | Payer: 59 | Source: Ambulatory Visit | Attending: Urgent Care | Admitting: Urgent Care

## 2022-10-05 ENCOUNTER — Ambulatory Visit (INDEPENDENT_AMBULATORY_CARE_PROVIDER_SITE_OTHER): Payer: 59

## 2022-10-05 VITALS — BP 122/82 | HR 97 | Temp 98.9°F | Resp 18

## 2022-10-05 DIAGNOSIS — R059 Cough, unspecified: Secondary | ICD-10-CM

## 2022-10-05 DIAGNOSIS — B349 Viral infection, unspecified: Secondary | ICD-10-CM | POA: Diagnosis present

## 2022-10-05 DIAGNOSIS — R0989 Other specified symptoms and signs involving the circulatory and respiratory systems: Secondary | ICD-10-CM

## 2022-10-05 DIAGNOSIS — Z8616 Personal history of COVID-19: Secondary | ICD-10-CM | POA: Insufficient documentation

## 2022-10-05 MED ORDER — PROMETHAZINE-DM 6.25-15 MG/5ML PO SYRP: 2.5000 mL | ORAL_SOLUTION | Freq: Three times a day (TID) | ORAL | 0 refills | Status: DC | PRN

## 2022-10-05 MED ORDER — ALBUTEROL SULFATE HFA 108 (90 BASE) MCG/ACT IN AERS: 1.0000 | INHALATION_SPRAY | Freq: Four times a day (QID) | RESPIRATORY_TRACT | 0 refills | Status: AC | PRN

## 2022-10-05 MED ORDER — PREDNISONE 50 MG PO TABS: 50.0000 mg | ORAL_TABLET | Freq: Every day | ORAL | 0 refills | Status: DC

## 2022-10-05 NOTE — ED Provider Notes (Signed)
[facility]  Note:  This document was prepared using Conservation officer, historic buildings and may include unintentional dictation errors.  MRN: [id number] DOB: 1996-08-27  Subjective:   Frances Lamb is a 26 y.o. female presenting for 4-day history of acute onset persistent coughing, chest congestion, shortness of breath, body aches, fatigue, malaise.  Patient had 1 sick contact with her son who was tested for COVID, strep, flu and all testing was negative.  He is currently undergoing a course of steroids and is improving.  Patient does vape daily.  No history of asthma.  No smoking or marijuana use.  She would like a chest x-ray to confirm that her lungs are clear.  No current facility-administered medications for this encounter.  Current Outpatient Medications:    albuterol (PROVENTIL) (2.5 MG/3ML) 0.083% nebulizer solution, Inhale into the lungs. (Patient not taking: Reported on 01/08/2022), Disp: , Rfl:    albuterol (VENTOLIN HFA) 108 (90 Base) MCG/ACT inhaler, SMARTSIG:2 Inhalation Via Inhaler Every 4 Hours PRN (Patient not taking: Reported on 01/08/2022), Disp: , Rfl:    amphetamine-dextroamphetamine (ADDERALL) 10 MG tablet, Take 1 tablet (10 mg total) by mouth daily., Disp: 30 tablet, Rfl: 0   amphetamine-dextroamphetamine (ADDERALL) 10 MG tablet, Take 1 tablet (10 mg total) by mouth daily., Disp: 30 tablet, Rfl: 0   amphetamine-dextroamphetamine (ADDERALL) 10 MG tablet, Take one tablet daily., Disp: 30 tablet, Rfl: 0   ARIPiprazole (ABILIFY) 5 MG tablet, Take one tablet daily., Disp: 30 tablet, Rfl: 5   buPROPion (WELLBUTRIN XL) 300 MG 24 hr tablet, Take one tablet every morning., Disp: 30 tablet, Rfl: 5   LamoTRIgine 250 MG TB24 24 hour tablet, Take 1 tablet by mouth daily., Disp: 30 tablet, Rfl: 5   lisdexamfetamine (VYVANSE) 50 MG capsule, Take 1 capsule (50 mg total) by mouth daily., Disp: 30 capsule, Rfl: 0   lisdexamfetamine (VYVANSE) 50 MG capsule, Take 1  capsule (50 mg total) by mouth daily., Disp: 30 capsule, Rfl: 0   lisdexamfetamine (VYVANSE) 50 MG capsule, Take 1 capsule (50 mg total) by mouth daily., Disp: 30 capsule, Rfl: 0   norgestimate-ethinyl estradiol (ORTHO-CYCLEN) 0.25-35 MG-MCG tablet, TAKE 1 TABLET BY MOUTH EVERY DAY, Disp: 84 tablet, Rfl: 1   QUEtiapine (SEROQUEL) 300 MG tablet, Take 1 tablet (300 mg total) by mouth at bedtime., Disp: 30 tablet, Rfl: 5   Semaglutide, 1 MG/DOSE, (OZEMPIC, 1 MG/DOSE,) 4 MG/3ML SOPN, Inject 1 mg into the skin once a week., Disp: 3 mL, Rfl: 5   Allergies  Allergen Reactions   Cephalexin Rash    Past Medical History:  Diagnosis Date   ADHD (attention deficit hyperactivity disorder)    Anxiety    Bipolar 2 disorder (HCC)    Depression    Gestational diabetes    Hypertension    Migraine    Miscarriage      Past Surgical History:  Procedure Laterality Date   ANKLE ARTHROSCOPY Left 05/21/2021   Procedure: ANKLE ARTHROSCOPY- OCD Repair, Debridement; Extensive;  Surgeon: Rosetta Posner, DPM;  Location: [facility];  Service: Podiatry;  Laterality: Left;   ANKLE RECONSTRUCTION Left 05/21/2021   Procedure: RECONSTRUCTION ANKLE- Brostrum-Gould x2;  Surgeon: Rosetta Posner, DPM;  Location: [facility];  Service: Podiatry;  Laterality: Left;  Choice, pre-op pop/saph block   CESAREAN SECTION N/A 10/28/2019   Procedure: CESAREAN SECTION;  Surgeon: Nadara Mustard, MD;  Location: [facility];  Service: Obstetrics;  Laterality: N/A;   TONSILLECTOMY AND ADENOIDECTOMY  Family History  Problem Relation Age of Onset   Diabetes Mother    Bipolar disorder Mother    Anxiety disorder Mother    Depression Mother    OCD Mother    Diabetes Maternal Grandfather     Social History   Tobacco Use   Smoking status: Former    Types: Cigarettes    Quit date: 11/29/2012    Years since quitting: 9.8   Smokeless tobacco: Never   Tobacco comments:    current vaping weekly  Vaping Use   Vaping  Use: Every day  Substance Use Topics   Alcohol use: Yes    Alcohol/week: 4.0 standard drinks of alcohol    Types: 2 Glasses of wine, 2 Standard drinks or equivalent per week   Drug use: Never    ROS   Objective:   Vitals: BP 122/82 (BP Location: Right Arm)   Pulse 97   Temp 98.9 F (37.2 C) (Oral)   Resp 18   LMP 09/21/2022   SpO2 97%   Physical Exam Constitutional:      General: She is not in acute distress.    Appearance: Normal appearance. She is well-developed and normal weight. She is not ill-appearing, toxic-appearing or diaphoretic.  HENT:     Head: Normocephalic and atraumatic.     Right Ear: Tympanic membrane, ear canal and external ear normal. No drainage or tenderness. No middle ear effusion. There is no impacted cerumen. Tympanic membrane is not erythematous or bulging.     Left Ear: Tympanic membrane, ear canal and external ear normal. No drainage or tenderness.  No middle ear effusion. There is no impacted cerumen. Tympanic membrane is not erythematous or bulging.     Nose: Congestion present. No rhinorrhea.     Mouth/Throat:     Mouth: Mucous membranes are moist. No oral lesions.     Pharynx: No pharyngeal swelling, oropharyngeal exudate, posterior oropharyngeal erythema or uvula swelling.     Tonsils: No tonsillar exudate or tonsillar abscesses.  Eyes:     General: No scleral icterus.       Right eye: No discharge.        Left eye: No discharge.     Extraocular Movements: Extraocular movements intact.     Right eye: Normal extraocular motion.     Left eye: Normal extraocular motion.     Conjunctiva/sclera: Conjunctivae normal.  Cardiovascular:     Rate and Rhythm: Normal rate and regular rhythm.     Heart sounds: Normal heart sounds. No murmur heard.    No friction rub. No gallop.  Pulmonary:     Effort: Pulmonary effort is normal. No respiratory distress.     Breath sounds: No stridor. No wheezing, rhonchi or rales.  Chest:     Chest wall: No  tenderness.  Musculoskeletal:     Cervical back: Normal range of motion and neck supple.  Lymphadenopathy:     Cervical: No cervical adenopathy.  Skin:    General: Skin is warm and dry.  Neurological:     General: No focal deficit present.     Mental Status: She is alert and oriented to person, place, and time.  Psychiatric:        Mood and Affect: Mood normal.        Behavior: Behavior normal.     DG Chest 2 View  Result Date: 10/05/2022 CLINICAL DATA:  Cough, chest congestion EXAM: CHEST - 2 VIEW COMPARISON:  07/31/2022 FINDINGS: The heart size and mediastinal  contours are within normal limits. Both lungs are clear. The visualized skeletal structures are unremarkable. IMPRESSION: No acute abnormality of the lungs. Electronically Signed   By: Delanna Ahmadi M.D.   On: 10/05/2022 11:26     Assessment and Plan :   PDMP not reviewed this encounter.  1. Acute viral syndrome   2. Chest congestion   3. History of COVID-19     Recommended an oral prednisone course to address her significant respiratory symptoms.  Advised that she hold off on vaping as much as possible.  Use supportive care otherwise for an acute viral syndrome.  COVID and flu tests are pending.  Chest x-ray negative. Counseled patient on potential for adverse effects with medications prescribed/recommended today, ER and return-to-clinic precautions discussed, patient verbalized understanding.    Jaynee Eagles, PA-C 10/05/22 1130

## 2022-10-05 NOTE — Discharge Instructions (Addendum)
We will notify you of your test results as they arrive and may take between about 24 hours.  I encourage you to sign up for MyChart if you have not already done so as this can be the easiest way for [place] to communicate results to you online or through a phone app.  Generally, we only contact you if it is a positive test result.  In the meantime, if you develop worsening symptoms including fever, chest pain, shortness of breath despite our current treatment plan then please report to the emergency room as this may be a sign of worsening status from possible viral infection.  Otherwise, we will manage this as a viral syndrome. For sore throat or cough try using a honey-based tea. Use 3 teaspoons of honey with juice squeezed from half lemon. Place shaved pieces of ginger into 1/2-1 cup of water and warm over stove top. Then mix the ingredients and repeat every 4 hours as needed. Please take Tylenol 500mg -650mg  every 6 hours for aches and pains, fevers. Hydrate very well with at least 2 liters of water. Eat light meals such as soups to replenish electrolytes and soft fruits, veggies. Start an antihistamine like Zyrtec for postnasal drainage, sinus congestion.  You can take this together with prednisone.  Use the cough medications as needed.   I will update you with your chest x-ray results through mychart when they get back.

## 2022-10-05 NOTE — ED Triage Notes (Signed)
Pt c/o coughing, body aches, fatigue, chest congestion, and SOB x3-4 days. States has been around sick family. States taking OTC meds with no relief.

## 2022-10-06 LAB — SARS CORONAVIRUS 2 (TAT 6-24 HRS): SARS Coronavirus 2: NEGATIVE

## 2022-10-11 ENCOUNTER — Ambulatory Visit (INDEPENDENT_AMBULATORY_CARE_PROVIDER_SITE_OTHER): Payer: 59 | Admitting: Adult Health

## 2022-10-11 ENCOUNTER — Encounter: Payer: Self-pay | Admitting: Adult Health

## 2022-10-11 DIAGNOSIS — F411 Generalized anxiety disorder: Secondary | ICD-10-CM

## 2022-10-11 DIAGNOSIS — F3181 Bipolar II disorder: Secondary | ICD-10-CM

## 2022-10-11 DIAGNOSIS — F331 Major depressive disorder, recurrent, moderate: Secondary | ICD-10-CM | POA: Diagnosis not present

## 2022-10-11 DIAGNOSIS — G4700 Insomnia, unspecified: Secondary | ICD-10-CM | POA: Diagnosis not present

## 2022-10-11 DIAGNOSIS — F909 Attention-deficit hyperactivity disorder, unspecified type: Secondary | ICD-10-CM | POA: Diagnosis not present

## 2022-10-11 MED ORDER — AMPHETAMINE-DEXTROAMPHETAMINE 10 MG PO TABS: 10.0000 mg | ORAL_TABLET | Freq: Every day | ORAL | 0 refills | Status: DC

## 2022-10-11 MED ORDER — LAMOTRIGINE ER 250 MG PO TB24: 1.0000 | ORAL_TABLET | Freq: Every day | ORAL | 5 refills | Status: DC

## 2022-10-11 MED ORDER — BUPROPION HCL ER (XL) 300 MG PO TB24: ORAL_TABLET | ORAL | 5 refills | Status: DC

## 2022-10-11 MED ORDER — QUETIAPINE FUMARATE 300 MG PO TABS: 300.0000 mg | ORAL_TABLET | Freq: Every day | ORAL | 5 refills | Status: DC

## 2022-10-11 MED ORDER — ARIPIPRAZOLE 5 MG PO TABS: ORAL_TABLET | ORAL | 5 refills | Status: DC

## 2022-10-11 MED ORDER — LISDEXAMFETAMINE DIMESYLATE 50 MG PO CAPS: 50.0000 mg | ORAL_CAPSULE | Freq: Every day | ORAL | 0 refills | Status: DC

## 2022-10-11 MED ORDER — BUPROPION HCL ER (XL) 150 MG PO TB24: 150.0000 mg | ORAL_TABLET | Freq: Every day | ORAL | 5 refills | Status: DC

## 2022-10-11 NOTE — Progress Notes (Signed)
Frances Lamb [id number] 1996/04/22 26 y.o.  Subjective:   Patient ID:  Frances Lamb is a 26 y.o. (DOB [birth year]) female.  Chief Complaint: No chief complaint on file.   HPI Frances Lamb presents to the office today for follow-up of ADD, GAD, ADHD, BPD2, and insomnia.  Describes mood today as "not the best". Pleasant. Denies tearfulness. Mood symptoms - reports depression, anxiety and irritability. Stating "I'm having a difficult time getting out of bed - things are piling up". Reports worry and rumination - financial stressors. Denies recent panic attacks. Reports mood swings - "more downs than ups". Making impulsive decisions - spending more money - "in the hole". Stating "I'm not doing good at all". Has been taking medications as prescribed - but feel like she needs to make a change. Husband concerned about her spending - husband working 3 jobs. Family doing well. Stable interest and motivation. Taking medications as prescribed.  Energy levels lower. Active, does not have a regular exercise routine.  Enjoys some usual interests and activities. Married. Lives with husband and son. Spending time with family. Appetite adequate - eating 3 times a day. Weight gain 235 to 240 pounds - starting weight 285 pounds. Continues on Ozempic. Sleeping well some nights than others. Averages 8 hours.  Focus and concentration stable - with Vyvanse and Adderall - not taking on days off.  Managing aspects of household. 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  Denies AH or VH. Denies self harm. Denies substance use.  Previous medication trials: Lamictal, Cymbalta, Latuda, Hydroxyzine, Adderall, Vyvanse, Seroquel   PHQ2-9    Flowsheet Row Nutrition from 10/10/2019 in [facility] [place]  PHQ-2 Total Score 2  PHQ-9 Total Score 11      Flowsheet Row ED from 10/05/2022 in [facility] ED from 07/31/2022 in [facility] EMERGENCY DEPARTMENT ED from  07/05/2022 in [facility]  C-SSRS RISK CATEGORY No Risk No Risk No Risk        Review of Systems:  Review of Systems  Musculoskeletal:  Negative for gait problem.  Neurological:  Negative for tremors.  Psychiatric/Behavioral:         Please refer to HPI    Medications: I have reviewed the patient's current medications.  Current Outpatient Medications  Medication Sig Dispense Refill   buPROPion (WELLBUTRIN XL) 150 MG 24 hr tablet Take 1 tablet (150 mg total) by mouth daily. 30 tablet 5   albuterol (PROVENTIL) (2.5 MG/3ML) 0.083% nebulizer solution Inhale into the lungs. (Patient not taking: Reported on 01/08/2022)     albuterol (VENTOLIN HFA) 108 (90 Base) MCG/ACT inhaler Inhale 1-2 puffs into the lungs every 6 (six) hours as needed for wheezing or shortness of breath. 18 g 0   amphetamine-dextroamphetamine (ADDERALL) 10 MG tablet Take 1 tablet (10 mg total) by mouth daily. 30 tablet 0   amphetamine-dextroamphetamine (ADDERALL) 10 MG tablet Take one tablet daily. 30 tablet 0   amphetamine-dextroamphetamine (ADDERALL) 10 MG tablet Take 1 tablet (10 mg total) by mouth daily. 30 tablet 0   ARIPiprazole (ABILIFY) 5 MG tablet Take one tablet daily. 30 tablet 5   buPROPion (WELLBUTRIN XL) 300 MG 24 hr tablet Take one tablet every morning. 30 tablet 5   LamoTRIgine 250 MG TB24 24 hour tablet Take 1 tablet by mouth daily. 30 tablet 5   lisdexamfetamine (VYVANSE) 50 MG capsule Take 1 capsule (50 mg total) by mouth daily.  30 capsule 0   lisdexamfetamine (VYVANSE) 50 MG capsule Take 1 capsule (50 mg total) by mouth daily. 30 capsule 0   lisdexamfetamine (VYVANSE) 50 MG capsule Take 1 capsule (50 mg total) by mouth daily. 30 capsule 0   norgestimate-ethinyl estradiol (ORTHO-CYCLEN) 0.25-35 MG-MCG tablet TAKE 1 TABLET BY MOUTH EVERY DAY 84 tablet 1   predniSONE (DELTASONE) 50 MG tablet Take 1 tablet (50 mg total) by mouth daily with breakfast. 5 tablet 0    promethazine-dextromethorphan (PROMETHAZINE-DM) 6.25-15 MG/5ML syrup Take 2.5 mLs by mouth 3 (three) times daily as needed for cough. 100 mL 0   QUEtiapine (SEROQUEL) 300 MG tablet Take 1 tablet (300 mg total) by mouth at bedtime. 30 tablet 5   Semaglutide, 1 MG/DOSE, (OZEMPIC, 1 MG/DOSE,) 4 MG/3ML SOPN Inject 1 mg into the skin once a week. 3 mL 5   No current facility-administered medications for this visit.    Medication Side Effects: None  Allergies:  Allergies  Allergen Reactions   Cephalexin Rash    Past Medical History:  Diagnosis Date   ADHD (attention deficit hyperactivity disorder)    Anxiety    Bipolar 2 disorder (HCC)    Depression    Gestational diabetes    Hypertension    Migraine    Miscarriage     Past Medical History, Surgical history, Social history, and Family history were reviewed and updated as appropriate.   Please see review of systems for further details on the patient's review from today.   Objective:   Physical Exam:  LMP 09/21/2022   Physical Exam Constitutional:      General: She is not in acute distress. Musculoskeletal:        General: No deformity.  Neurological:     Mental Status: She is alert and oriented to person, place, and time.     Coordination: Coordination normal.  Psychiatric:        Attention and Perception: Attention and perception normal. She does not perceive auditory or visual hallucinations.        Mood and Affect: Mood normal. Mood is not anxious or depressed. Affect is not labile, blunt, angry or inappropriate.        Speech: Speech normal.        Behavior: Behavior normal.        Thought Content: Thought content normal. Thought content is not paranoid or delusional. Thought content does not include homicidal or suicidal ideation. Thought content does not include homicidal or suicidal plan.        Cognition and Memory: Cognition and memory normal.        Judgment: Judgment normal.     Comments: Insight intact      Lab Review:     Component Value Date/Time   NA 139 07/31/2022 1926   NA 138 09/16/2013 2333   K 3.7 07/31/2022 1926   K 3.2 (L) 09/16/2013 2333   CL 106 07/31/2022 1926   CL 109 (H) 09/16/2013 2333   CO2 23 07/31/2022 1926   CO2 22 09/16/2013 2333   GLUCOSE 100 (H) 07/31/2022 1926   GLUCOSE 153 (H) 09/16/2013 2333   BUN 6 07/31/2022 1926   BUN 7 09/16/2013 2333   CREATININE 0.74 07/31/2022 1926   CREATININE 0.66 09/16/2013 2333   CALCIUM 9.0 07/31/2022 1926   CALCIUM 8.8 (L) 09/16/2013 2333   PROT 7.8 05/02/2020 1914   ALBUMIN 4.0 05/02/2020 1914   AST 22 05/02/2020 1914   ALT 17 05/02/2020 1914  ALKPHOS 104 05/02/2020 1914   BILITOT 0.6 05/02/2020 1914   GFRNONAA >60 07/31/2022 1926   GFRNONAA >60 09/16/2013 2333   GFRAA >60 05/02/2020 1914   GFRAA >60 09/16/2013 2333       Component Value Date/Time   WBC 12.3 (H) 07/31/2022 1926   RBC 4.57 07/31/2022 1926   HGB 12.8 07/31/2022 1926   HGB 11.1 08/27/2019 1048   HCT 39.2 07/31/2022 1926   HCT 34.0 08/27/2019 1048   PLT 373 07/31/2022 1926   PLT 276 08/27/2019 1048   MCV 85.8 07/31/2022 1926   MCV 86 08/27/2019 1048   MCV 84 09/16/2013 2333   MCH 28.0 07/31/2022 1926   MCHC 32.7 07/31/2022 1926   RDW 13.9 07/31/2022 1926   RDW 13.2 08/27/2019 1048   RDW 13.5 09/16/2013 2333   LYMPHSABS 2.8 07/05/2022 1712   LYMPHSABS 2.6 08/27/2019 1048   MONOABS 0.5 07/05/2022 1712   EOSABS 0.1 07/05/2022 1712   EOSABS 0.1 08/27/2019 1048   BASOSABS 0.1 07/05/2022 1712   BASOSABS 0.0 08/27/2019 1048    No results found for: "POCLITH", "LITHIUM"   No results found for: "PHENYTOIN", "PHENOBARB", "VALPROATE", "CBMZ"   .res Assessment: Plan:    Plan:  PDMP reviewed  1. Lamictal 250mg  daily 2. Increase Wellbutrin XL 300mg   to 450mg  daily. Denies seizure history. 3. Seroquel 300mg  at bedtime 4. Vyvanse 50mg  every morning - taking on work days - using some days - last pick up in July. 5. Abilify 5mg  daily 6.  Adderall 10mg  for afternoon use - taking on work days only.  120/80  RTC 3 months  Set up with a therapist  Patient advised to contact office with any questions, adverse effects, or acute worsening in signs and symptoms.   Discussed potential benefits, risks, and side effects of stimulants with patient to include increased heart rate, palpitations, insomnia, increased anxiety, increased irritability, or decreased appetite.  Instructed patient to contact office if experiencing any significant tolerability issues.   Counseled patient regarding potential benefits, risks, and side effects of Lamictal to include potential risk of Stevens-Johnson syndrome. Advised patient to stop taking Lamictal and contact office immediately if rash develops and to seek urgent medical attention if rash is severe and/or spreading quickly.  Diagnoses and all orders for this visit:  Bipolar II disorder (HCC) -     LamoTRIgine 250 MG TB24 24 hour tablet; Take 1 tablet by mouth daily. -     ARIPiprazole (ABILIFY) 5 MG tablet; Take one tablet daily. -     QUEtiapine (SEROQUEL) 300 MG tablet; Take 1 tablet (300 mg total) by mouth at bedtime. -     buPROPion (WELLBUTRIN XL) 150 MG 24 hr tablet; Take 1 tablet (150 mg total) by mouth daily.  Insomnia, unspecified type -     QUEtiapine (SEROQUEL) 300 MG tablet; Take 1 tablet (300 mg total) by mouth at bedtime.  Attention deficit hyperactivity disorder (ADHD), unspecified ADHD type -     amphetamine-dextroamphetamine (ADDERALL) 10 MG tablet; Take 1 tablet (10 mg total) by mouth daily. -     lisdexamfetamine (VYVANSE) 50 MG capsule; Take 1 capsule (50 mg total) by mouth daily. -     buPROPion (WELLBUTRIN XL) 300 MG 24 hr tablet; Take one tablet every morning.  Major depressive disorder, recurrent episode, moderate (HCC)  GAD (generalized anxiety disorder)     Please see After Visit Summary for patient specific instructions.  No future appointments.  No orders of  the defined  types were placed in this encounter.   -------------------------------

## 2022-10-12 IMAGING — MR MR ANKLE*L* W/O CM
5 series · 40 of 40 positions shown · non-contrast
Comparison: Plain films left ankle 03/15/2021 and 05/02/2020.

CLINICAL DATA: Left ankle pain since a twisting injury and fracture
1 month ago. Subsequent encounter.

EXAM:
MRI OF THE LEFT ANKLE WITHOUT CONTRAST
TECHNIQUE: Multiplanar, multisequence MR imaging of the ankle was performed. No
intravenous contrast was administered.

[Series 4: PD fat-sat · axial · left · 3.0mm · 0.50mm/px · z∈[-88,+52]mm · 9 of 36 slices shown]
[im 1/36]
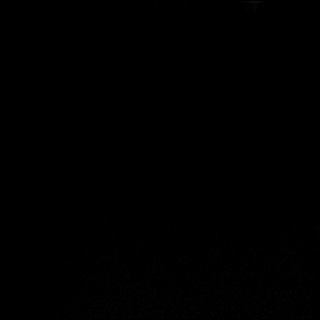
[im 5/36]
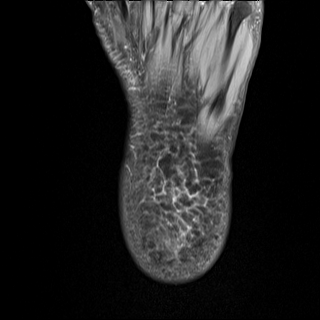
[im 9/36]
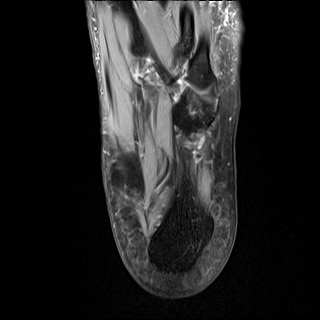
[im 14/36]
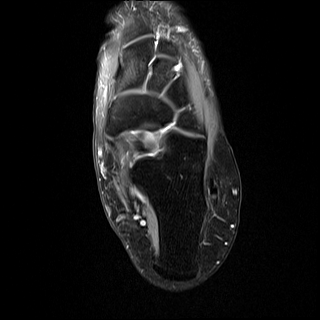
[im 18/36]
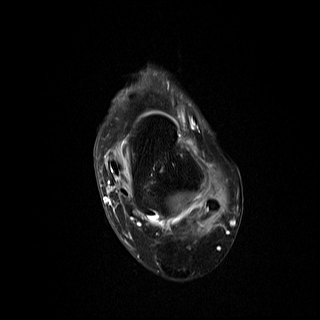
[im 22/36]
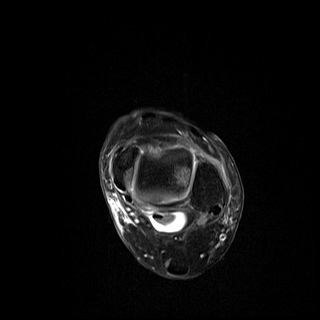
[im 27/36]
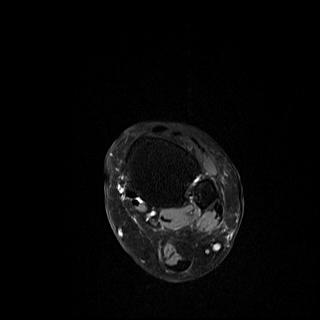
[im 31/36]
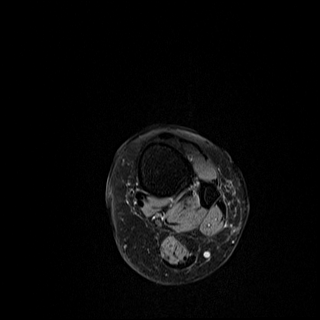
[im 36/36]
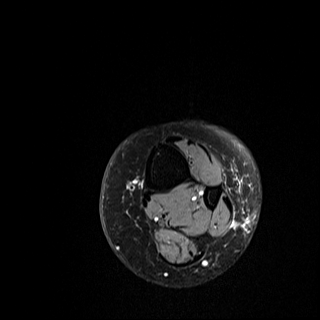

[Series 5: T2 fat-sat · axial · left · 3.0mm · 0.50mm/px · z∈[-88,+52]mm · 9 of 36 slices shown]
[im 1/36]
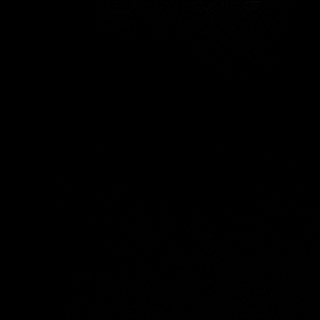
[im 5/36]
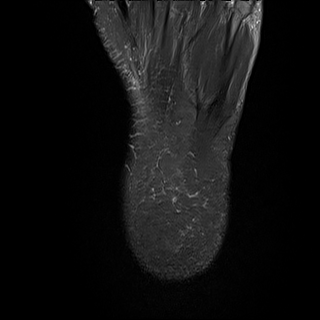
[im 9/36]
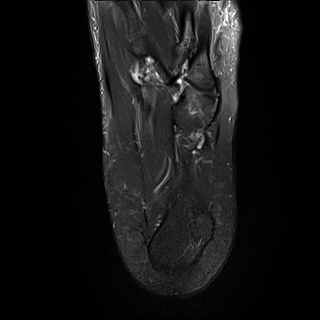
[im 14/36]
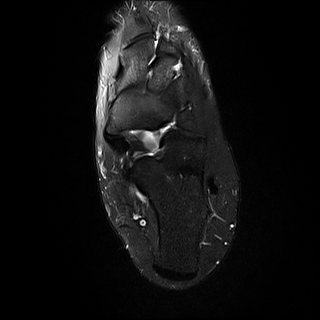
[im 18/36]
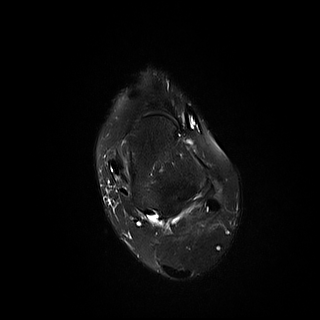
[im 22/36]
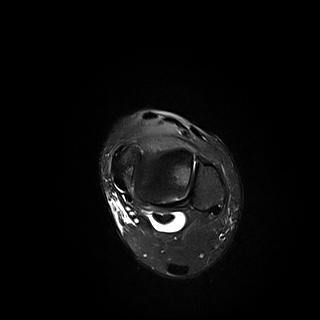
[im 27/36]
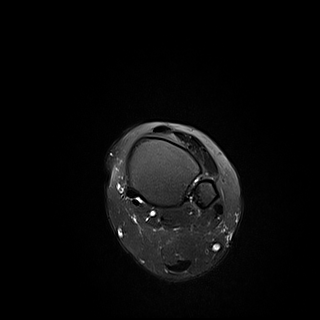
[im 31/36]
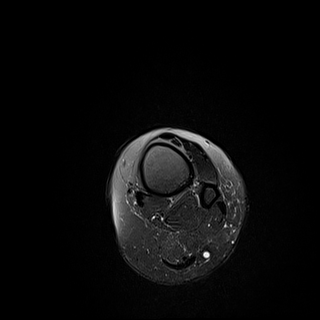
[im 36/36]
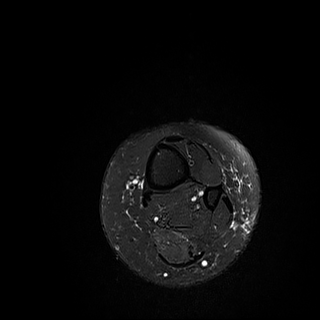

[Series 7: T2 · coronal · left · 3.0mm · 0.62mm/px · 10 of 40 slices shown]
[im 1/40]
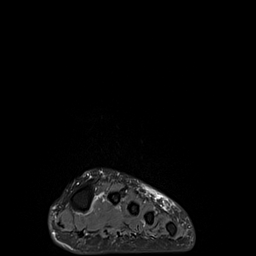
[im 5/40]
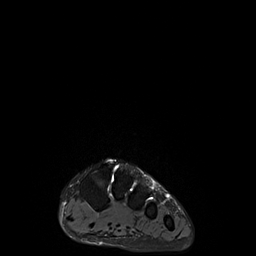
[im 9/40]
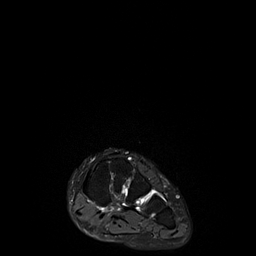
[im 14/40]
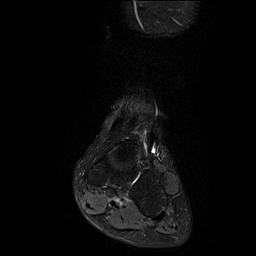
[im 18/40]
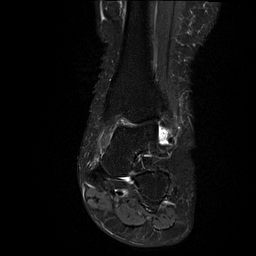
[im 22/40]
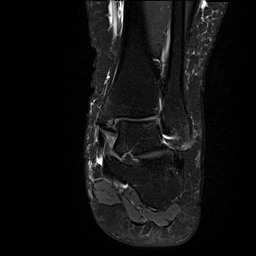
[im 27/40]
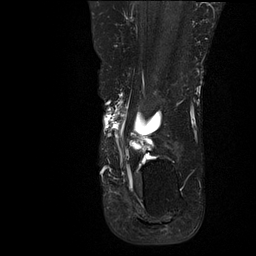
[im 31/40]
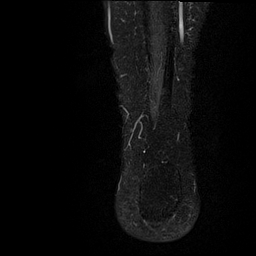
[im 35/40]
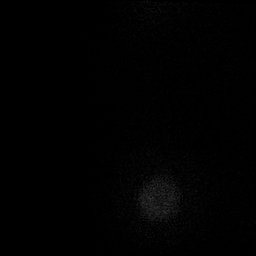
[im 40/40]
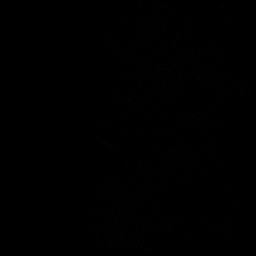

[Series 8: T1 · sagittal · left · 4.0mm · 0.70mm/px · 6 of 23 slices shown]
[im 1/23]
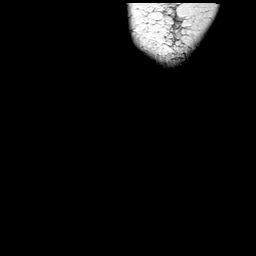
[im 5/23]
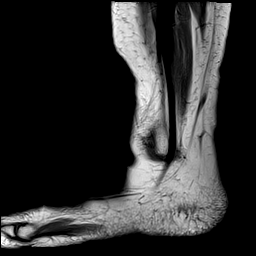
[im 9/23]
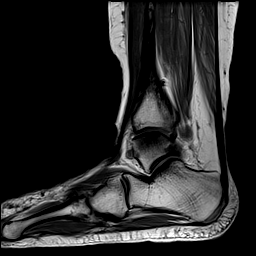
[im 14/23]
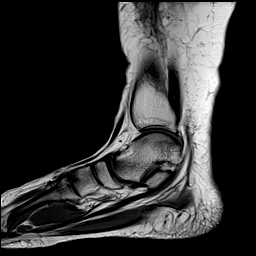
[im 18/23]
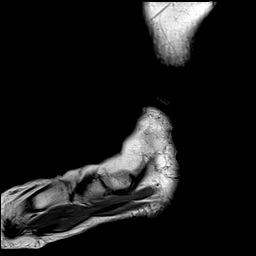
[im 23/23]
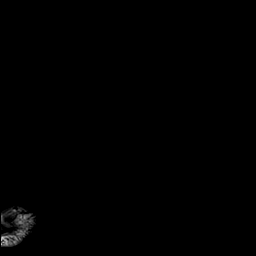

[Series 9: STIR · sagittal · left · 4.0mm · 0.35mm/px · 6 of 23 slices shown]
[im 1/23]
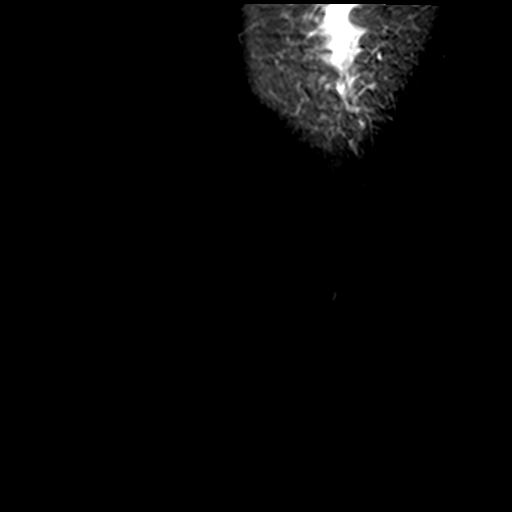
[im 5/23]
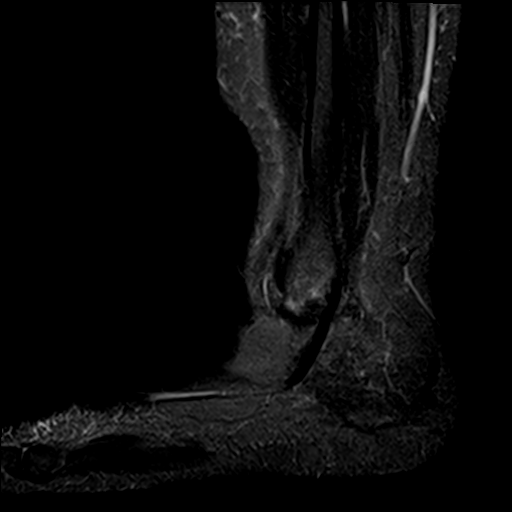
[im 9/23]
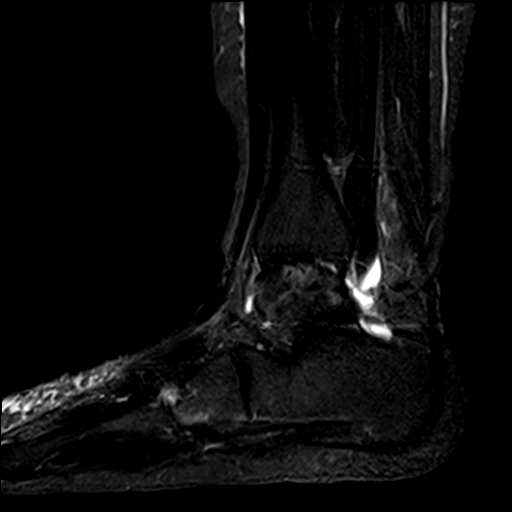
[im 14/23]
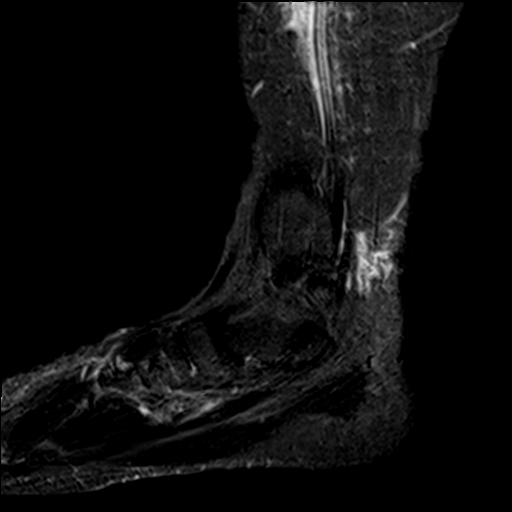
[im 18/23]
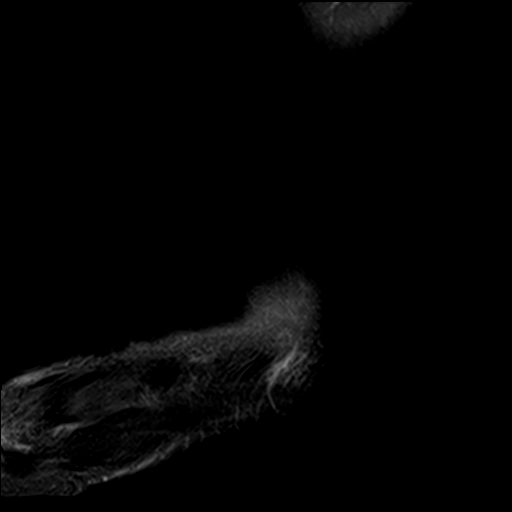
[im 23/23]
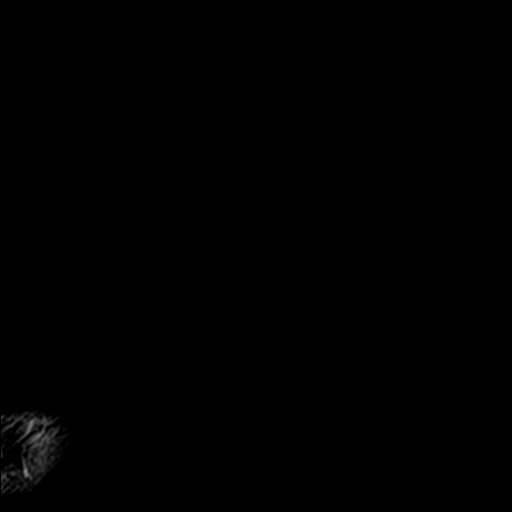

[40 of 40 positions shown; findings below may reference images not displayed]

FINDINGS: TENDONS

Peroneal: Intact.

Posteromedial: Intact.

Anterior: Intact.

Achilles: Intact.

Plantar Fascia: Intact.  Negative for plantar fasciitis.

LIGAMENTS

Lateral: The anterior talofibular ligament is completely torn. The
calcaneofibular ligament is intact but there is an avulsion fracture
at the attachment site of the ligament to the fibula.

Medial: The deep fibers of the deltoid ligament, the tibiotalar
component, is completely or almost completely torn. The superficial
fibers of the ligament appear intact.

CARTILAGE

Ankle Joint: There is an osteochondral lesion of the lateral talar
dome measuring 0.8 cm transverse by 0 1 cm AP. There is underlying
subchondral edema and a small fracture of the lateral periphery of
the talar dome. Overlying cartilage is frayed and irregular.

Subtalar Joints/Sinus Tarsi: Normal.

Bones: As described above.  Otherwise negative.

Other: None.
IMPRESSION: Complete ATFL tear. There is an avulsion fracture at the attachment
site of the calcaneofibular ligament to the fibula.

Complete or near complete tear of the deep fibers of the deltoid
ligament.

Osteochondral lesion lateral talar dome as described above.

## 2022-11-08 ENCOUNTER — Ambulatory Visit (INDEPENDENT_AMBULATORY_CARE_PROVIDER_SITE_OTHER): Payer: 59 | Admitting: Adult Health

## 2022-11-08 ENCOUNTER — Encounter: Payer: Self-pay | Admitting: Adult Health

## 2022-11-08 DIAGNOSIS — F331 Major depressive disorder, recurrent, moderate: Secondary | ICD-10-CM

## 2022-11-08 DIAGNOSIS — F909 Attention-deficit hyperactivity disorder, unspecified type: Secondary | ICD-10-CM

## 2022-11-08 DIAGNOSIS — F3181 Bipolar II disorder: Secondary | ICD-10-CM

## 2022-11-08 DIAGNOSIS — F411 Generalized anxiety disorder: Secondary | ICD-10-CM | POA: Diagnosis not present

## 2022-11-08 DIAGNOSIS — G4700 Insomnia, unspecified: Secondary | ICD-10-CM

## 2022-11-08 MED ORDER — AMPHETAMINE-DEXTROAMPHETAMINE 30 MG PO TABS: 30.0000 mg | ORAL_TABLET | Freq: Every day | ORAL | 0 refills | Status: DC

## 2022-11-08 NOTE — Progress Notes (Signed)
Frances Lamb [id number] August 20, 1996 26 y.o.  Subjective:   Patient ID:  Frances Lamb is a 26 y.o. (DOB 1996/11/18) female.  Chief Complaint: No chief complaint on file.   HPI Frances Lamb presents to the office today for follow-up of ADD, GAD, ADHD, BPD2, and insomnia.  Describes mood today as "not the best". Pleasant. Denies tearfulness. Mood symptoms - denies depression and irritability. Reports some anxiety. Reports worry and rumination. Denies recent panic attacks. Mood is consistent - denies over spending. Feels like medications are helpful. Family doing well.  Stable interest and motivation. Taking medications as prescribed.  Energy levels lower. Active, does not have a regular exercise routine.  Enjoys some usual interests and activities. Married. Lives with husband and son. Spending time with family. Appetite adequate - eating 3 times a day. Weight gain 248 pounds - starting weight 285 pounds. Continues on Ozempic. Sleeping well some nights than others. Averages 8 hours.  Focus and concentration difficulties.  Managing aspects of household. 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  Denies AH or VH. Denies self harm. Denies substance use.  Previous medication trials: Lamictal, Cymbalta, Latuda, Hydroxyzine, Adderall, Vyvanse, Seroquel    PHQ2-9    Flowsheet Row Nutrition from 10/10/2019 in [facility] [place]  PHQ-2 Total Score 2  PHQ-9 Total Score 11      Flowsheet Row ED from 10/05/2022 in [facility] at [facility] ED from 07/31/2022 in [facility] EMERGENCY DEPARTMENT ED from 07/05/2022 in [facility]  C-SSRS RISK CATEGORY No Risk No Risk No Risk        Review of Systems:  Review of Systems  Musculoskeletal:  Negative for gait problem.  Neurological:  Negative for tremors.  Psychiatric/Behavioral:         Please refer to HPI    Medications: I have reviewed the patient's  current medications.  Current Outpatient Medications  Medication Sig Dispense Refill   amphetamine-dextroamphetamine (ADDERALL) 30 MG tablet Take 1 tablet by mouth daily. 30 tablet 0   albuterol (PROVENTIL) (2.5 MG/3ML) 0.083% nebulizer solution Inhale into the lungs. (Patient not taking: Reported on 01/08/2022)     albuterol (VENTOLIN HFA) 108 (90 Base) MCG/ACT inhaler Inhale 1-2 puffs into the lungs every 6 (six) hours as needed for wheezing or shortness of breath. 18 g 0   ARIPiprazole (ABILIFY) 5 MG tablet Take one tablet daily. 30 tablet 5   buPROPion (WELLBUTRIN XL) 150 MG 24 hr tablet Take 1 tablet (150 mg total) by mouth daily. 30 tablet 5   buPROPion (WELLBUTRIN XL) 300 MG 24 hr tablet Take one tablet every morning. 30 tablet 5   LamoTRIgine 250 MG TB24 24 hour tablet Take 1 tablet by mouth daily. 30 tablet 5   lisdexamfetamine (VYVANSE) 50 MG capsule Take 1 capsule (50 mg total) by mouth daily. 30 capsule 0   lisdexamfetamine (VYVANSE) 50 MG capsule Take 1 capsule (50 mg total) by mouth daily. 30 capsule 0   lisdexamfetamine (VYVANSE) 50 MG capsule Take 1 capsule (50 mg total) by mouth daily. 30 capsule 0   norgestimate-ethinyl estradiol (ORTHO-CYCLEN) 0.25-35 MG-MCG tablet TAKE 1 TABLET BY MOUTH EVERY DAY 84 tablet 1   predniSONE (DELTASONE) 50 MG tablet Take 1 tablet (50 mg total) by mouth daily with breakfast. 5 tablet 0   promethazine-dextromethorphan (PROMETHAZINE-DM) 6.25-15 MG/5ML syrup Take 2.5 mLs by mouth 3 (three) times daily as needed for cough. 100 mL 0  QUEtiapine (SEROQUEL) 300 MG tablet Take 1 tablet (300 mg total) by mouth at bedtime. 30 tablet 5   Semaglutide, 1 MG/DOSE, (OZEMPIC, 1 MG/DOSE,) 4 MG/3ML SOPN Inject 1 mg into the skin once a week. 3 mL 5   No current facility-administered medications for this visit.    Medication Side Effects: None  Allergies:  Allergies  Allergen Reactions   Cephalexin Rash    Past Medical History:  Diagnosis Date   ADHD  (attention deficit hyperactivity disorder)    Anxiety    Bipolar 2 disorder (HCC)    Depression    Gestational diabetes    Hypertension    Migraine    Miscarriage     Past Medical History, Surgical history, Social history, and Family history were reviewed and updated as appropriate.   Please see review of systems for further details on the patient's review from today.   Objective:   Physical Exam:  There were no vitals taken for this visit.  Physical Exam Constitutional:      General: She is not in acute distress. Musculoskeletal:        General: No deformity.  Neurological:     Mental Status: She is alert and oriented to person, place, and time.     Coordination: Coordination normal.  Psychiatric:        Attention and Perception: Attention and perception normal. She does not perceive auditory or visual hallucinations.        Mood and Affect: Mood normal. Mood is not anxious or depressed. Affect is not labile, blunt, angry or inappropriate.        Speech: Speech normal.        Behavior: Behavior normal.        Thought Content: Thought content normal. Thought content is not paranoid or delusional. Thought content does not include homicidal or suicidal ideation. Thought content does not include homicidal or suicidal plan.        Cognition and Memory: Cognition and memory normal.        Judgment: Judgment normal.     Comments: Insight intact     Lab Review:     Component Value Date/Time   NA 139 07/31/2022 1926   NA 138 09/16/2013 2333   K 3.7 07/31/2022 1926   K 3.2 (L) 09/16/2013 2333   CL 106 07/31/2022 1926   CL 109 (H) 09/16/2013 2333   CO2 23 07/31/2022 1926   CO2 22 09/16/2013 2333   GLUCOSE 100 (H) 07/31/2022 1926   GLUCOSE 153 (H) 09/16/2013 2333   BUN 6 07/31/2022 1926   BUN 7 09/16/2013 2333   CREATININE 0.74 07/31/2022 1926   CREATININE 0.66 09/16/2013 2333   CALCIUM 9.0 07/31/2022 1926   CALCIUM 8.8 (L) 09/16/2013 2333   PROT 7.8 05/02/2020 1914    ALBUMIN 4.0 05/02/2020 1914   AST 22 05/02/2020 1914   ALT 17 05/02/2020 1914   ALKPHOS 104 05/02/2020 1914   BILITOT 0.6 05/02/2020 1914   GFRNONAA >60 07/31/2022 1926   GFRNONAA >60 09/16/2013 2333   GFRAA >60 05/02/2020 1914   GFRAA >60 09/16/2013 2333       Component Value Date/Time   WBC 12.3 (H) 07/31/2022 1926   RBC 4.57 07/31/2022 1926   HGB 12.8 07/31/2022 1926   HGB 11.1 08/27/2019 1048   HCT 39.2 07/31/2022 1926   HCT 34.0 08/27/2019 1048   PLT 373 07/31/2022 1926   PLT 276 08/27/2019 1048   MCV 85.8 07/31/2022 1926  MCV 86 08/27/2019 1048   MCV 84 09/16/2013 2333   MCH 28.0 07/31/2022 1926   MCHC 32.7 07/31/2022 1926   RDW 13.9 07/31/2022 1926   RDW 13.2 08/27/2019 1048   RDW 13.5 09/16/2013 2333   LYMPHSABS 2.8 07/05/2022 1712   LYMPHSABS 2.6 08/27/2019 1048   MONOABS 0.5 07/05/2022 1712   EOSABS 0.1 07/05/2022 1712   EOSABS 0.1 08/27/2019 1048   BASOSABS 0.1 07/05/2022 1712   BASOSABS 0.0 08/27/2019 1048    No results found for: "POCLITH", "LITHIUM"   No results found for: "PHENYTOIN", "PHENOBARB", "VALPROATE", "CBMZ"   .res Assessment: Plan:    Plan:  PDMP reviewed  Lamictal 250mg  daily Wellbutrin XL 450mg  daily. Denies seizure history. Seroquel 300mg  at bedtime Abilify 5mg  daily  Adderall 10mg  for afternoon use - taking on work days only. Add Adderall XR 30mg   every morning - taking on work days - using some days - last pick up in July.  D/C Vyvanse 50mg  - due to cost 120/80  RTC 3 months  Set up with a therapist  Patient advised to contact office with any questions, adverse effects, or acute worsening in signs and symptoms.   Discussed potential benefits, risks, and side effects of stimulants with patient to include increased heart rate, palpitations, insomnia, increased anxiety, increased irritability, or decreased appetite.  Instructed patient to contact office if experiencing any significant tolerability issues.   Counseled  patient regarding potential benefits, risks, and side effects of Lamictal to include potential risk of Stevens-Johnson syndrome. Advised patient to stop taking Lamictal and contact office immediately if rash develops and to seek urgent medical attention if rash is severe and/or spreading quickly.  Diagnoses and all orders for this visit:  Bipolar II disorder (HCC)  Attention deficit hyperactivity disorder (ADHD), unspecified ADHD type -     amphetamine-dextroamphetamine (ADDERALL) 30 MG tablet; Take 1 tablet by mouth daily.  GAD (generalized anxiety disorder)  Major depressive disorder, recurrent episode, moderate (HCC)  Insomnia, unspecified type     Please see After Visit Summary for patient specific instructions.  Future Appointments  Date Time Provider Department Center  12/07/2022  3:00 PM Armen Waring, , NP [facility] None    No orders of the defined types were placed in this encounter.   -------------------------------

## 2022-12-07 ENCOUNTER — Ambulatory Visit (INDEPENDENT_AMBULATORY_CARE_PROVIDER_SITE_OTHER): Payer: Self-pay | Admitting: Adult Health

## 2022-12-07 DIAGNOSIS — F489 Nonpsychotic mental disorder, unspecified: Secondary | ICD-10-CM

## 2022-12-07 NOTE — Progress Notes (Signed)
Patient no show appointment. ? ?

## 2023-01-03 ENCOUNTER — Encounter: Payer: Self-pay | Admitting: Obstetrics and Gynecology

## 2023-01-03 ENCOUNTER — Ambulatory Visit: Payer: 59 | Admitting: Obstetrics and Gynecology

## 2023-01-03 VITALS — BP 123/84 | HR 94 | Ht 62.0 in | Wt 258.0 lb

## 2023-01-03 DIAGNOSIS — Z01419 Encounter for gynecological examination (general) (routine) without abnormal findings: Secondary | ICD-10-CM

## 2023-01-03 DIAGNOSIS — Z3202 Encounter for pregnancy test, result negative: Secondary | ICD-10-CM

## 2023-01-03 DIAGNOSIS — Z3169 Encounter for other general counseling and advice on procreation: Secondary | ICD-10-CM

## 2023-01-03 DIAGNOSIS — N912 Amenorrhea, unspecified: Secondary | ICD-10-CM

## 2023-01-03 DIAGNOSIS — N926 Irregular menstruation, unspecified: Secondary | ICD-10-CM

## 2023-01-03 NOTE — Progress Notes (Unsigned)
New GYN here to Establish Care.    LMP:12/01/2022 Last pap:06/05/21 WNL  Contraception:None  Mammogram:Never  STD Screening: Declines   CC: Period is late at home UPT was Negative. Pt states she is not preventing pregnancy ok with getting pregnant at this time.  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

## 2023-01-03 NOTE — Progress Notes (Unsigned)
Obstetrics and Gynecology Annual Patient Evaluation  Appointment Date: 01/03/2023  OBGYN Clinic: [facility]  Chief Complaint: Annual exam, establish care  History of Present Illness: Frances Lamb is a 27 y.o. [phone] (Patient's last menstrual period was 12/01/2022.), seen for the above chief complaint. History significant for BMI 40s, h/o c-section, bipolar, GAD, h/o depression.  Period is a few days late. She stopped her OCPs about six months and has qmonth, regular periods; patient is trying to conceive.   Review of Systems: A comprehensive review of systems was negative.   Past Medical History:  Past Medical History:  Diagnosis Date   ADHD (attention deficit hyperactivity disorder)    Anxiety    Bipolar 2 disorder ([place])    Depression    Gestational diabetes    Hypertension    Migraine    Miscarriage     Past Surgical History:  Past Surgical History:  Procedure Laterality Date   ANKLE ARTHROSCOPY Left 05/21/2021   Procedure: ANKLE ARTHROSCOPY- OCD Repair, Debridement; Extensive;  Surgeon: Caroline More, DPM;  Location: [facility];  Service: Podiatry;  Laterality: Left;   ANKLE RECONSTRUCTION Left 05/21/2021   Procedure: RECONSTRUCTION ANKLE- [place]um-Gould x2;  Surgeon: Caroline More, DPM;  Location: [facility];  Service: Podiatry;  Laterality: Left;  Choice, pre-op pop/saph block   CESAREAN SECTION N/A 10/28/2019   Procedure: CESAREAN SECTION;  Surgeon: Gae Dry, MD;  Location: [facility];  Service: Obstetrics;  Laterality: N/A;   TONSILLECTOMY AND ADENOIDECTOMY      Past Obstetrical History:  OB History  Gravida Para Term Preterm AB Living  2 1 1   1 1   SAB IAB Ectopic Multiple Live Births  1     0 1    # Outcome Date GA Lbr Len/2nd Weight Sex Delivery Anes PTL Lv  2 Term 10/28/19 [redacted]w[redacted]d  6 lb 11.6 oz (3.05 kg) M CS-LTranv Spinal, EPI  LIV  1 SAB              Past Gynecological History: As per HPI. History  of Pap Smear(s): Yes.   Last pap 2022, which was negative  Social History:  Social History   Socioeconomic History   Marital status: Married    Spouse name: shannon   Number of children: 1   Years of education: Not on file   Highest education level: Associate degree: occupational, Hotel manager, or vocational program  Occupational History   Not on file  Tobacco Use   Smoking status: Former    Types: Cigarettes    Quit date: 11/29/2012    Years since quitting: 10.1   Smokeless tobacco: Never  Vaping Use   Vaping Use: Former  Substance and Sexual Activity   Alcohol use: Yes    Alcohol/week: 4.0 standard drinks of alcohol    Types: 2 Glasses of wine, 2 Standard drinks or equivalent per week    Comment: socially   Drug use: Never   Sexual activity: Yes    Partners: Male    Birth control/protection: Pill  Other Topics Concern   Not on file  Social History Narrative   Not on file   Social Determinants of Health   Financial Resource Strain: Low Risk  (10/24/2019)   Overall Financial Resource Strain (CARDIA)    Difficulty of Paying Living Expenses: Not hard at all  Food Insecurity: No Food Insecurity (10/24/2019)   Hunger Vital Sign    Worried About Running Out of Food in the Last  Year: Never true    [place] in the Last Year: Never true  Transportation Needs: No Transportation Needs (10/24/2019)   PRAPARE - Hydrologist (Medical): No    Lack of Transportation (Non-Medical): No  Physical Activity: Inactive (10/24/2019)   Exercise Vital Sign    Days of Exercise per Week: 0 days    Minutes of Exercise per Session: 0 min  Stress: No Stress Concern Present (10/24/2019)   [place]    Feeling of Stress : Not at all  Social Connections: Unknown (10/24/2019)   Social Connection and Isolation Panel [NHANES]    Frequency of Communication with Friends and Family: More than three  times a week    Frequency of Social Gatherings with Friends and Family: More than three times a week    Attends Religious Services: Never    Marine scientist or Organizations: No    Attends Archivist Meetings: Never    Marital Status: Not on file  Intimate Partner Violence: Not At Risk (10/24/2019)   Humiliation, Afraid, Rape, and Kick questionnaire    Fear of Current or Ex-Partner: No    Emotionally Abused: No    Physically Abused: No    Sexually Abused: No    Family History:  Family History  Problem Relation Age of Onset   Diabetes Mother    Bipolar disorder Mother    Anxiety disorder Mother    Depression Mother    OCD Mother    Diabetes Maternal Grandfather    Medications [name] had no medications administered during this visit. Current Outpatient Medications  Medication Sig Dispense Refill   albuterol (VENTOLIN HFA) 108 (90 Base) MCG/ACT inhaler Inhale 1-2 puffs into the lungs every 6 (six) hours as needed for wheezing or shortness of breath. 18 g 0   QUEtiapine (SEROQUEL) 300 MG tablet Take 1 tablet (300 mg total) by mouth at bedtime. 30 tablet 5   albuterol (PROVENTIL) (2.5 MG/3ML) 0.083% nebulizer solution Inhale into the lungs. (Patient not taking: Reported on 01/08/2022)     amphetamine-dextroamphetamine (ADDERALL) 30 MG tablet Take 1 tablet by mouth daily. 30 tablet 0   ARIPiprazole (ABILIFY) 5 MG tablet Take one tablet daily. 30 tablet 5   buPROPion (WELLBUTRIN XL) 150 MG 24 hr tablet Take 1 tablet (150 mg total) by mouth daily. 30 tablet 5   buPROPion (WELLBUTRIN XL) 300 MG 24 hr tablet Take one tablet every morning. 30 tablet 5   LamoTRIgine 250 MG TB24 24 hour tablet Take 1 tablet by mouth daily. 30 tablet 5   No current facility-administered medications for this visit.    Allergies Cephalexin   Physical Exam:  BP 123/84   Pulse 94   Ht 5\' 2"  (1.575 m)   Wt 258 lb (117 kg)   LMP 12/01/2022   BMI 47.19 kg/m  Body mass index is  47.19 kg/m.  General appearance: Well nourished, well developed female in no acute distress.  Neck:  Supple, normal appearance, and no thyromegaly  Breast: s/s denied.  Cardiovascular: normal s1 and s2.  No murmurs, rubs or gallops. Respiratory:  Clear to auscultation bilateral. Normal respiratory effort Abdomen: positive bowel sounds and no masses, hernias; diffusely non tender to palpation, non distended Neuro/Psych:  Normal mood and affect.  Skin:  Warm and dry.  Lymphatic:  No inguinal lymphadenopathy.   Pelvic exam: is limited by body habitus EGBUS: within  normal limits Vagina: within normal limits and with no blood or discharge in the vault Cervix: normal appearing cervix without tenderness, discharge or lesions. Uterus:  nonenlarged and non tender Adnexa:  normal adnexa and no mass, fullness, tenderness Rectovaginal: deferred  Laboratory: UPT negative  Radiology: none  Assessment: patient doing well  Plan:  1. Amenorrhea Recommend repeat home UPT in two weeks - POCT urine pregnancy  2. Late period  3. Pre-conception counseling Patient followed by Psychiatry. Meds reviewed and fine to stay one. Patient to continue multivitamin  4. Well woman exam Routine care. D/w her re: weight loss strategies. Patient has already seen Nutrition in the past and declines repeat visit.   RTC PRN  Durene Romans MD Attending [facility] for [employer] farm manager)

## 2023-01-04 LAB — POCT URINE PREGNANCY: Preg Test, Ur: NEGATIVE

## 2023-01-21 ENCOUNTER — Ambulatory Visit: Payer: 59 | Admitting: Adult Health

## 2023-01-21 ENCOUNTER — Encounter: Payer: Self-pay | Admitting: Adult Health

## 2023-01-21 DIAGNOSIS — G4700 Insomnia, unspecified: Secondary | ICD-10-CM | POA: Diagnosis not present

## 2023-01-21 DIAGNOSIS — F3181 Bipolar II disorder: Secondary | ICD-10-CM | POA: Diagnosis not present

## 2023-01-21 DIAGNOSIS — F411 Generalized anxiety disorder: Secondary | ICD-10-CM | POA: Diagnosis not present

## 2023-01-21 DIAGNOSIS — F909 Attention-deficit hyperactivity disorder, unspecified type: Secondary | ICD-10-CM

## 2023-01-21 MED ORDER — AMPHETAMINE-DEXTROAMPHETAMINE 30 MG PO TABS: 30.0000 mg | ORAL_TABLET | Freq: Every day | ORAL | 0 refills | Status: DC

## 2023-01-21 NOTE — Progress Notes (Signed)
LADESTINY TSCHETTER [id number] Sep 19, 1996 27 y.o.  Subjective:   Patient ID:  Frances Lamb is a 27 y.o. (DOB 17-Jul-1996) female.  Chief Complaint: No chief complaint on file.   HPI Frances Lamb presents to the office today for follow-up of ADD, GAD, ADHD, BPD2, and insomnia.  Describes mood today as "ok". Pleasant. Denies tearfulness. Mood symptoms - reports "some" depression. Denies anxiety. Reports irritability. Denies worry, rumination, and over thinking. Reports one recent panic attack. Mood is variable. Stating "I feel like I'm doing ok". Feels like medications are helpful. Family doing well.  Stable interest and motivation. Taking medications as prescribed.  Energy levels lower. Active, does not have a regular exercise routine.  Enjoys some usual interests and activities. Married. Lives with husband and son. Spending time with family. Appetite adequate - eating 3 times a day. Weight gain 250 pounds - starting weight 285 pounds. Continues on Ozempic. Sleeping well some nights than others. Averages 8 hours.  Focus and concentration difficulties - has not been able to pick up her prescriptions. Managing aspects of household. 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  Denies AH or VH. Denies self harm. Denies substance use.  Previous medication trials: Lamictal, Cymbalta, Latuda, Hydroxyzine, Adderall, Vyvanse, Seroquel   PHQ2-9    Flowsheet Row Nutrition from 10/10/[place] [facility] Total Score 2  PHQ-9 Total Score 11      [facility] from 10/05/2022 in [facility] [place] [facility]) ED from 07/31/2022 in [facility] at [facility] ED from 07/05/2022 in [facility] Emergency Department at [place] No Risk No Risk No Risk        Review of Systems:  Review of Systems  Musculoskeletal:  Negative for gait problem.  Neurological:   Negative for tremors.  Psychiatric/Behavioral:         Please refer to HPI    Medications: I have reviewed the patient's current medications.  Current Outpatient Medications  Medication Sig Dispense Refill   albuterol (PROVENTIL) (2.5 MG/3ML) 0.083% nebulizer solution Inhale into the lungs. (Patient not taking: Reported on 01/08/2022)     albuterol (VENTOLIN HFA) 108 (90 Base) MCG/ACT inhaler Inhale 1-2 puffs into the lungs every 6 (six) hours as needed for wheezing or shortness of breath. 18 g 0   amphetamine-dextroamphetamine (ADDERALL) 30 MG tablet Take 1 tablet by mouth daily. 30 tablet 0   ARIPiprazole (ABILIFY) 5 MG tablet Take one tablet daily. 30 tablet 5   buPROPion (WELLBUTRIN XL) 150 MG 24 hr tablet Take 1 tablet (150 mg total) by mouth daily. 30 tablet 5   buPROPion (WELLBUTRIN XL) 300 MG 24 hr tablet Take one tablet every morning. 30 tablet 5   LamoTRIgine 250 MG TB24 24 hour tablet Take 1 tablet by mouth daily. 30 tablet 5   lisdexamfetamine (VYVANSE) 50 MG capsule Take 1 capsule (50 mg total) by mouth daily. 30 capsule 0   lisdexamfetamine (VYVANSE) 50 MG capsule Take 1 capsule (50 mg total) by mouth daily. (Patient not taking: Reported on 01/03/2023) 30 capsule 0   lisdexamfetamine (VYVANSE) 50 MG capsule Take 1 capsule (50 mg total) by mouth daily. (Patient not taking: Reported on 01/03/2023) 30 capsule 0   norgestimate-ethinyl estradiol (ORTHO-CYCLEN) 0.25-35 MG-MCG tablet TAKE 1 TABLET BY MOUTH EVERY DAY (Patient not taking: Reported on 01/03/2023) 84 tablet 1   predniSONE (DELTASONE)  50 MG tablet Take 1 tablet (50 mg total) by mouth daily with breakfast. (Patient not taking: Reported on 01/03/2023) 5 tablet 0   promethazine-dextromethorphan (PROMETHAZINE-DM) 6.25-15 MG/5ML syrup Take 2.5 mLs by mouth 3 (three) times daily as needed for cough. (Patient not taking: Reported on 01/03/2023) 100 mL 0   QUEtiapine (SEROQUEL) 300 MG tablet Take 1 tablet (300 mg total) by mouth at bedtime. 30  tablet 5   Semaglutide, 1 MG/DOSE, (OZEMPIC, 1 MG/DOSE,) 4 MG/3ML SOPN Inject 1 mg into the skin once a week. (Patient not taking: Reported on 01/03/2023) 3 mL 5   No current facility-administered medications for this visit.    Medication Side Effects: None  Allergies:  Allergies  Allergen Reactions   Cephalexin Rash    Past Medical History:  Diagnosis Date   ADHD (attention deficit hyperactivity disorder)    Anxiety    Bipolar 2 disorder ([facility])    Depression    Gestational diabetes    Hypertension    Migraine    Miscarriage     Past Medical History, Surgical history, Social history, and Family history were reviewed and updated as appropriate.   Please see review of systems for further details on the patient's review from today.   Objective:   Physical Exam:  LMP 12/01/2022   Physical Exam Constitutional:      General: She is not in acute distress. Musculoskeletal:        General: No deformity.  Neurological:     Mental Status: She is alert and oriented to person, place, and time.     Coordination: Coordination normal.  Psychiatric:        Attention and Perception: Attention and perception normal. She does not perceive auditory or visual hallucinations.        Mood and Affect: Mood normal. Mood is not anxious or depressed. Affect is not labile, blunt, angry or inappropriate.        Speech: Speech normal.        Behavior: Behavior normal.        Thought Content: Thought content normal. Thought content is not paranoid or delusional. Thought content does not include homicidal or suicidal ideation. Thought content does not include homicidal or suicidal plan.        Cognition and Memory: Cognition and memory normal.        Judgment: Judgment normal.     Comments: Insight intact     Lab Review:     Component Value Date/Time   NA 139 07/31/2022 1926   NA 138 09/16/2013 2333   K 3.7 07/31/2022 1926   K 3.2 (L) 09/16/2013 2333   CL 106 07/31/2022 1926   CL 109 (H)  09/16/2013 2333   CO2 23 07/31/2022 1926   CO2 22 09/16/2013 2333   GLUCOSE 100 (H) 07/31/2022 1926   GLUCOSE 153 (H) 09/16/2013 2333   BUN 6 07/31/2022 1926   BUN 7 09/16/2013 2333   CREATININE 0.74 07/31/2022 1926   CREATININE 0.66 09/16/2013 2333   CALCIUM 9.0 07/31/2022 1926   CALCIUM 8.8 (L) 09/16/2013 2333   PROT 7.8 05/02/2020 1914   ALBUMIN 4.0 05/02/2020 1914   AST 22 05/02/2020 1914   ALT 17 05/02/2020 1914   ALKPHOS 104 05/02/2020 1914   BILITOT 0.6 05/02/2020 1914   GFRNONAA >60 07/31/2022 1926   GFRNONAA >60 09/16/2013 2333   GFRAA >60 05/02/2020 1914   GFRAA >60 09/16/2013 2333       Component Value Date/Time  WBC 12.3 (H) 07/31/2022 1926   RBC 4.57 07/31/2022 1926   HGB 12.8 07/31/2022 1926   HGB 11.1 08/27/2019 1048   HCT 39.2 07/31/2022 1926   HCT 34.0 08/27/2019 1048   PLT 373 07/31/2022 1926   PLT 276 08/27/2019 1048   MCV 85.8 07/31/2022 1926   MCV 86 08/27/2019 1048   MCV 84 09/16/2013 2333   MCH 28.0 07/31/2022 1926   MCHC 32.7 07/31/2022 1926   RDW 13.9 07/31/2022 1926   RDW 13.2 08/27/2019 1048   RDW 13.5 09/16/2013 2333   LYMPHSABS 2.8 07/05/2022 1712   LYMPHSABS 2.6 08/27/2019 1048   MONOABS 0.5 07/05/2022 1712   EOSABS 0.1 07/05/2022 1712   EOSABS 0.1 08/27/2019 1048   BASOSABS 0.1 07/05/2022 1712   BASOSABS 0.0 08/27/2019 1048    No results found for: "POCLITH", "LITHIUM"   No results found for: "PHENYTOIN", "PHENOBARB", "VALPROATE", "CBMZ"   .res Assessment: Plan:    Plan:  PDMP reviewed  Lamictal '250mg'$  daily Wellbutrin XL '450mg'$  daily. Denies seizure history. Seroquel '300mg'$  at bedtime Abilify '5mg'$  daily Adderall '30mg'$  every morning.   RTC 3 months  Set up with a therapist  Patient advised to contact office with any questions, adverse effects, or acute worsening in signs and symptoms.   Discussed potential benefits, risks, and side effects of stimulants with patient to include increased heart rate, palpitations,  insomnia, increased anxiety, increased irritability, or decreased appetite.  Instructed patient to contact office if experiencing any significant tolerability issues.   Counseled patient regarding potential benefits, risks, and side effects of Lamictal to include potential risk of Stevens-Johnson syndrome. Advised patient to stop taking Lamictal and contact office immediately if rash develops and to seek urgent medical attention if rash is severe and/or spreading quickly.   There are no diagnoses linked to this encounter.   Please see After Visit Summary for patient specific instructions.  Future Appointments  Date Time Provider [place]  01/21/2023  1:40 PM Avenly Roberge, Berdie Ogren, NP CP-CP None    No orders of the defined types were placed in this encounter.   -------------------------------

## 2023-02-18 ENCOUNTER — Emergency Department
Admission: EM | Admit: 2023-02-18 | Discharge: 2023-02-18 | Disposition: A | Discharge status: Home / Self Care | Payer: 59 | Attending: Emergency Medicine | Admitting: Emergency Medicine

## 2023-02-18 ENCOUNTER — Emergency Department: Payer: 59

## 2023-02-18 ENCOUNTER — Other Ambulatory Visit: Payer: Self-pay

## 2023-02-18 DIAGNOSIS — M542 Cervicalgia: Secondary | ICD-10-CM | POA: Diagnosis present

## 2023-02-18 DIAGNOSIS — Y92410 Unspecified street and highway as the place of occurrence of the external cause: Secondary | ICD-10-CM | POA: Insufficient documentation

## 2023-02-18 DIAGNOSIS — S93401A Sprain of unspecified ligament of right ankle, initial encounter: Secondary | ICD-10-CM | POA: Insufficient documentation

## 2023-02-18 DIAGNOSIS — S90511A Abrasion, right ankle, initial encounter: Secondary | ICD-10-CM

## 2023-02-18 DIAGNOSIS — I10 Essential (primary) hypertension: Secondary | ICD-10-CM | POA: Diagnosis not present

## 2023-02-18 DIAGNOSIS — S161XXA Strain of muscle, fascia and tendon at neck level, initial encounter: Secondary | ICD-10-CM | POA: Diagnosis not present

## 2023-02-18 DIAGNOSIS — R519 Headache, unspecified: Secondary | ICD-10-CM | POA: Diagnosis not present

## 2023-02-18 MED ORDER — CYCLOBENZAPRINE HCL 10 MG PO TABS
10.0000 mg | ORAL_TABLET | Freq: Once | ORAL | Status: AC
  Administered 2023-02-18: 10 mg via ORAL
  Filled 2023-02-18: qty 1

## 2023-02-18 MED ORDER — IBUPROFEN 600 MG PO TABS
600.0000 mg | ORAL_TABLET | Freq: Once | ORAL | Status: AC
  Administered 2023-02-18: 600 mg via ORAL
  Filled 2023-02-18: qty 1
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
## 2023-02-18 NOTE — Discharge Instructions (Signed)
Please rest, ice, and elevate your ankle often throughout the day.  Take the medication as prescribed.  Be aware that the Flexeril will likely make you sleepy.  Follow-up with your primary care provider if you are not improving over the week.  Return to the emergency department for symptoms of change or worsen if you are unable to schedule an appointment.
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
## 2023-02-19 ENCOUNTER — Telehealth: Payer: Self-pay | Admitting: Emergency Medicine

## 2023-02-19 MED ORDER — CYCLOBENZAPRINE HCL 5 MG PO TABS: 5.0000 mg | ORAL_TABLET | Freq: Three times a day (TID) | ORAL | 0 refills | Status: AC | PRN

## 2023-02-19 MED ORDER — IBUPROFEN 200 MG PO TABS: 400.0000 mg | ORAL_TABLET | Freq: Three times a day (TID) | ORAL | 0 refills | Status: AC | PRN

## 2023-02-19 NOTE — Telephone Encounter (Signed)
Rx forgotten

## 2023-04-15 ENCOUNTER — Ambulatory Visit: Payer: 59 | Admitting: Adult Health

## 2023-04-15 ENCOUNTER — Encounter: Payer: Self-pay | Admitting: Adult Health

## 2023-04-15 DIAGNOSIS — F411 Generalized anxiety disorder: Secondary | ICD-10-CM | POA: Diagnosis not present

## 2023-04-15 DIAGNOSIS — G4700 Insomnia, unspecified: Secondary | ICD-10-CM | POA: Diagnosis not present

## 2023-04-15 DIAGNOSIS — F3181 Bipolar II disorder: Secondary | ICD-10-CM | POA: Diagnosis not present

## 2023-04-15 DIAGNOSIS — F909 Attention-deficit hyperactivity disorder, unspecified type: Secondary | ICD-10-CM | POA: Diagnosis not present

## 2023-04-15 MED ORDER — LAMOTRIGINE ER 250 MG PO TB24: 1.0000 | ORAL_TABLET | Freq: Every day | ORAL | 5 refills | Status: DC

## 2023-04-15 MED ORDER — AMPHETAMINE-DEXTROAMPHETAMINE 30 MG PO TABS: 30.0000 mg | ORAL_TABLET | Freq: Every day | ORAL | 0 refills | Status: DC

## 2023-04-15 MED ORDER — BUPROPION HCL ER (XL) 150 MG PO TB24: 150.0000 mg | ORAL_TABLET | Freq: Every day | ORAL | 5 refills | Status: DC

## 2023-04-15 MED ORDER — QUETIAPINE FUMARATE 300 MG PO TABS: 300.0000 mg | ORAL_TABLET | Freq: Every day | ORAL | 5 refills | Status: DC

## 2023-04-15 MED ORDER — BUPROPION HCL ER (XL) 300 MG PO TB24: ORAL_TABLET | ORAL | 5 refills | Status: DC
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
## 2023-04-22 ENCOUNTER — Ambulatory Visit: Payer: 59 | Admitting: Adult Health

## 2023-05-16 ENCOUNTER — Ambulatory Visit (INDEPENDENT_AMBULATORY_CARE_PROVIDER_SITE_OTHER): Payer: 59 | Admitting: Adult Health

## 2023-05-16 ENCOUNTER — Encounter: Payer: Self-pay | Admitting: Adult Health

## 2023-05-16 DIAGNOSIS — G4700 Insomnia, unspecified: Secondary | ICD-10-CM | POA: Diagnosis not present

## 2023-05-16 DIAGNOSIS — F411 Generalized anxiety disorder: Secondary | ICD-10-CM

## 2023-05-16 DIAGNOSIS — F3181 Bipolar II disorder: Secondary | ICD-10-CM

## 2023-05-16 MED ORDER — CARIPRAZINE HCL 1.5 MG PO CAPS: 1.5000 mg | ORAL_CAPSULE | Freq: Every day | ORAL | 2 refills | Status: DC

## 2023-05-16 NOTE — Progress Notes (Signed)
Frances Lamb [id number] 01-Apr-1996 26 y.o.  Subjective:   Patient ID:  Frances Lamb is a 27 y.o. (DOB 07/31/1996) female.  Chief Complaint: No chief complaint on file.   HPI Frances Lamb presents to the office today for follow-up of ADD, GAD, ADHD, BPD2, and insomnia.  Describes mood today as "ok". Pleasant. Denies tearfulness. Mood symptoms - denies depression, anxiety, and  irritability. Decent worry and rumination. Denies over thinking. Denies panic attack. Mood is improved. Stating "I feel like I'm doing a lot better". Feels like the addition of Vraylar 1.5mg  daily has been helpful. Family doing well. Stable interest and motivation. Taking medications as prescribed.  Energy levels lower. Active, does not have a regular exercise routine.  Enjoys some usual interests and activities. Married. Lives with husband and son. Spending time with family. Appetite adequate. Weight gain 250 pounds. Restarted Ozempic. Sleeping better some nights than others. Averages 8 hours - waking up every 2 to 3 hours.  Focus and concentration improved. Managing aspects of household. 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  Denies AH or VH. Denies self harm. Denies substance use.  Previous medication trials: Lamictal, Cymbalta, Latuda, Hydroxyzine, Adderall, Vyvanse, Seroquel  PHQ2-9    Flowsheet Row Nutrition from 10/10/2019 in [place] [facility] at [facility] Total Score 2  PHQ-9 Total Score 11      Flowsheet Row ED from 02/18/2023 in [facility] Emergency Department at [facility] ED from 10/05/2022 in [facility] Urgent Care at [facility]) ED from 07/31/2022 in [facility] Emergency Department at [facility]  C-SSRS RISK CATEGORY No Risk No Risk No Risk        Review of Systems:  Review of Systems  Musculoskeletal:  Negative for gait problem.  Neurological:  Negative for tremors.  Psychiatric/Behavioral:          Please refer to HPI    Medications: I have reviewed the patient's current medications.  Current Outpatient Medications  Medication Sig Dispense Refill   cariprazine (VRAYLAR) 1.5 MG capsule Take 1 capsule (1.5 mg total) by mouth daily. 30 capsule 2   albuterol (PROVENTIL) (2.5 MG/3ML) 0.083% nebulizer solution Inhale into the lungs. (Patient not taking: Reported on 01/08/2022)     albuterol (VENTOLIN HFA) 108 (90 Base) MCG/ACT inhaler Inhale 1-2 puffs into the lungs every 6 (six) hours as needed for wheezing or shortness of breath. 18 g 0   amphetamine-dextroamphetamine (ADDERALL) 30 MG tablet Take 1 tablet by mouth daily. 30 tablet 0   amphetamine-dextroamphetamine (ADDERALL) 30 MG tablet Take 1 tablet by mouth daily. 30 tablet 0   buPROPion (WELLBUTRIN XL) 150 MG 24 hr tablet Take 1 tablet (150 mg total) by mouth daily. 30 tablet 5   buPROPion (WELLBUTRIN XL) 300 MG 24 hr tablet Take one tablet every morning. 30 tablet 5   cyclobenzaprine (FLEXERIL) 5 MG tablet Take 1 tablet (5 mg total) by mouth 3 (three) times daily as needed for muscle spasms. 30 tablet 0   LamoTRIgine 250 MG TB24 24 hour tablet Take 1 tablet (250 mg total) by mouth daily. 30 tablet 5   norgestimate-ethinyl estradiol (ORTHO-CYCLEN) 0.25-35 MG-MCG tablet TAKE 1 TABLET BY MOUTH EVERY DAY (Patient not taking: Reported on 01/03/2023) 84 tablet 1   predniSONE (DELTASONE) 50 MG tablet Take 1 tablet (50 mg total) by mouth daily with breakfast. (Patient not taking: Reported on 01/03/2023) 5 tablet 0   promethazine-dextromethorphan (PROMETHAZINE-DM) 6.25-15  MG/5ML syrup Take 2.5 mLs by mouth 3 (three) times daily as needed for cough. (Patient not taking: Reported on 01/03/2023) 100 mL 0   QUEtiapine (SEROQUEL) 300 MG tablet Take 1 tablet (300 mg total) by mouth at bedtime. 30 tablet 5   Semaglutide, 1 MG/DOSE, (OZEMPIC, 1 MG/DOSE,) 4 MG/3ML SOPN Inject 1 mg into the skin once a week. (Patient not taking: Reported on 01/03/2023) 3 mL 5   No  current facility-administered medications for this visit.    Medication Side Effects: None  Allergies:  Allergies  Allergen Reactions   Cephalexin Rash    Past Medical History:  Diagnosis Date   ADHD (attention deficit hyperactivity disorder)    Anxiety    Bipolar 2 disorder (HCC)    Depression    Gestational diabetes    Hypertension    Migraine    Miscarriage     Past Medical History, Surgical history, Social history, and Family history were reviewed and updated as appropriate.   Please see review of systems for further details on the patient's review from today.   Objective:   Physical Exam:  There were no vitals taken for this visit.  Physical Exam Constitutional:      General: She is not in acute distress. Musculoskeletal:        General: No deformity.  Neurological:     Mental Status: She is alert and oriented to person, place, and time.     Coordination: Coordination normal.  Psychiatric:        Attention and Perception: Attention and perception normal. She does not perceive auditory or visual hallucinations.        Mood and Affect: Mood normal. Mood is not anxious or depressed. Affect is not labile, blunt, angry or inappropriate.        Speech: Speech normal.        Behavior: Behavior normal.        Thought Content: Thought content normal. Thought content is not paranoid or delusional. Thought content does not include homicidal or suicidal ideation. Thought content does not include homicidal or suicidal plan.        Cognition and Memory: Cognition and memory normal.        Judgment: Judgment normal.     Comments: Insight intact     Lab Review:     Component Value Date/Time   NA 139 07/31/2022 1926   NA 138 09/16/2013 2333   K 3.7 07/31/2022 1926   K 3.2 (L) 09/16/2013 2333   CL 106 07/31/2022 1926   CL 109 (H) 09/16/2013 2333   CO2 23 07/31/2022 1926   CO2 22 09/16/2013 2333   GLUCOSE 100 (H) 07/31/2022 1926   GLUCOSE 153 (H) 09/16/2013 2333    BUN 6 07/31/2022 1926   BUN 7 09/16/2013 2333   CREATININE 0.74 07/31/2022 1926   CREATININE 0.66 09/16/2013 2333   CALCIUM 9.0 07/31/2022 1926   CALCIUM 8.8 (L) 09/16/2013 2333   PROT 7.8 05/02/2020 1914   ALBUMIN 4.0 05/02/2020 1914   AST 22 05/02/2020 1914   ALT 17 05/02/2020 1914   ALKPHOS 104 05/02/2020 1914   BILITOT 0.6 05/02/2020 1914   GFRNONAA >60 07/31/2022 1926   GFRNONAA >60 09/16/2013 2333   GFRAA >60 05/02/2020 1914   GFRAA >60 09/16/2013 2333       Component Value Date/Time   WBC 12.3 (H) 07/31/2022 1926   RBC 4.57 07/31/2022 1926   HGB 12.8 07/31/2022 1926   HGB 11.1 08/27/2019 1048  HCT 39.2 07/31/2022 1926   HCT 34.0 08/27/2019 1048   PLT 373 07/31/2022 1926   PLT 276 08/27/2019 1048   MCV 85.8 07/31/2022 1926   MCV 86 08/27/2019 1048   MCV 84 09/16/2013 2333   MCH 28.0 07/31/2022 1926   MCHC 32.7 07/31/2022 1926   RDW 13.9 07/31/2022 1926   RDW 13.2 08/27/2019 1048   RDW 13.5 09/16/2013 2333   LYMPHSABS 2.8 07/05/2022 1712   LYMPHSABS 2.6 08/27/2019 1048   MONOABS 0.5 07/05/2022 1712   EOSABS 0.1 07/05/2022 1712   EOSABS 0.1 08/27/2019 1048   BASOSABS 0.1 07/05/2022 1712   BASOSABS 0.0 08/27/2019 1048    No results found for: "POCLITH", "LITHIUM"   No results found for: "PHENYTOIN", "PHENOBARB", "VALPROATE", "CBMZ"   .res Assessment: Plan:    Plan:  PDMP reviewed  Lamictal 250mg  daily Wellbutrin XL 450mg  daily. Denies seizure history. Seroquel 300mg  at bedtime Adderall 30mg  every morning.   Vraylar 1.5mg  daily-samples given  RTC 3 months  Set up with a therapist  Patient advised to contact office with any questions, adverse effects, or acute worsening in signs and symptoms.   Discussed potential benefits, risks, and side effects of stimulants with patient to include increased heart rate, palpitations, insomnia, increased anxiety, increased irritability, or decreased appetite.  Instructed patient to contact office if  experiencing any significant tolerability issues.   Counseled patient regarding potential benefits, risks, and side effects of Lamictal to include potential risk of Stevens-Johnson syndrome. Advised patient to stop taking Lamictal and contact office immediately if rash develops and to seek urgent medical attention if rash is severe and/or spreading quickly.   Diagnoses and all orders for this visit:  Bipolar II disorder (HCC) -     cariprazine (VRAYLAR) 1.5 MG capsule; Take 1 capsule (1.5 mg total) by mouth daily.  Insomnia, unspecified type  GAD (generalized anxiety disorder)     Please see After Visit Summary for patient specific instructions.  Future Appointments  Date Time Provider Department Center  07/27/2023  1:20 PM Jalaysia Lobb, Thereasa Solo, NP CP-CP None    No orders of the defined types were placed in this encounter.   -------------------------------

## 2023-07-27 ENCOUNTER — Ambulatory Visit: Payer: 59 | Admitting: Adult Health

## 2023-08-10 ENCOUNTER — Encounter: Payer: Self-pay | Admitting: Adult Health

## 2023-08-10 ENCOUNTER — Ambulatory Visit: Payer: 59 | Admitting: Adult Health

## 2023-08-10 DIAGNOSIS — G4700 Insomnia, unspecified: Secondary | ICD-10-CM

## 2023-08-10 DIAGNOSIS — F909 Attention-deficit hyperactivity disorder, unspecified type: Secondary | ICD-10-CM

## 2023-08-10 DIAGNOSIS — F3181 Bipolar II disorder: Secondary | ICD-10-CM

## 2023-08-10 MED ORDER — BUPROPION HCL ER (XL) 300 MG PO TB24: ORAL_TABLET | ORAL | 5 refills | Status: DC

## 2023-08-10 MED ORDER — BUPROPION HCL ER (XL) 150 MG PO TB24: 150.0000 mg | ORAL_TABLET | Freq: Every day | ORAL | 5 refills | Status: DC

## 2023-08-10 MED ORDER — LAMOTRIGINE ER 250 MG PO TB24: 1.0000 | ORAL_TABLET | Freq: Every day | ORAL | 5 refills | Status: DC

## 2023-08-10 MED ORDER — AMPHETAMINE-DEXTROAMPHETAMINE 30 MG PO TABS: 30.0000 mg | ORAL_TABLET | Freq: Every day | ORAL | 0 refills | Status: DC

## 2023-08-10 MED ORDER — CARIPRAZINE HCL 1.5 MG PO CAPS: 1.5000 mg | ORAL_CAPSULE | Freq: Every day | ORAL | 5 refills | Status: DC

## 2023-08-10 MED ORDER — QUETIAPINE FUMARATE 300 MG PO TABS: 300.0000 mg | ORAL_TABLET | Freq: Every day | ORAL | 5 refills | Status: DC

## 2023-08-10 NOTE — Progress Notes (Signed)
Frances Lamb [id number] Nov 24, 1996 27 y.o.  Subjective:   Patient ID:  Frances Lamb is a 27 y.o. (DOB 07-18-1996) female.  Chief Complaint: No chief complaint on file.   HPI Frances Lamb presents to the office today for follow-up of ADD, GAD, ADHD, BPD2, and insomnia.  Describes mood today as "ok". Pleasant. Denies tearfulness. Mood symptoms - denies depression, anxiety and irritability. Denies worry and rumination. Denies over thinking. Denies panic attack. Mood is consistent. Stating "I feel like I'm doing a lot better". Feels like current medication regimen works well. Family doing well. Stable interest and motivation. Taking medications as prescribed.  Energy levels lower. Active, does not have a regular exercise routine.  Enjoys some usual interests and activities. Married. Lives with husband and son - age 64. Spending time with family. Appetite adequate. Weight gain. Restarted Wegovy. Sleeping better some nights than others - working night shift. Averages 8 hours. Focus and concentration improved. Managing aspects of household. 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  Denies AH or VH. Denies self harm. Denies substance use.  Previous medication trials: Lamictal, Cymbalta, Latuda, Hydroxyzine, Adderall, Vyvanse, Seroquel    PHQ2-9    Flowsheet Row Nutrition from 10/10/2019 in [facility] at [facility] Total Score 2  PHQ-9 Total Score 11      Flowsheet Row ED from 02/18/2023 in [facility] Emergency Department at [facility] ED from 10/05/2022 in [facility] at [facility] ED from 07/31/2022 in [facility] Emergency Department at [facility]  C-SSRS RISK CATEGORY No Risk No Risk No Risk        Review of Systems:  Review of Systems  Musculoskeletal:  Negative for gait problem.  Neurological:  Negative for tremors.  Psychiatric/Behavioral:         Please refer to HPI     Medications: I have reviewed the patient's current medications.  Current Outpatient Medications  Medication Sig Dispense Refill   albuterol (PROVENTIL) (2.5 MG/3ML) 0.083% nebulizer solution Inhale into the lungs. (Patient not taking: Reported on 01/08/2022)     albuterol (VENTOLIN HFA) 108 (90 Base) MCG/ACT inhaler Inhale 1-2 puffs into the lungs every 6 (six) hours as needed for wheezing or shortness of breath. 18 g 0   amphetamine-dextroamphetamine (ADDERALL) 30 MG tablet Take 1 tablet by mouth daily. 30 tablet 0   amphetamine-dextroamphetamine (ADDERALL) 30 MG tablet Take 1 tablet by mouth daily. 30 tablet 0   buPROPion (WELLBUTRIN XL) 150 MG 24 hr tablet Take 1 tablet (150 mg total) by mouth daily. 30 tablet 5   buPROPion (WELLBUTRIN XL) 300 MG 24 hr tablet Take one tablet every morning. 30 tablet 5   cariprazine (VRAYLAR) 1.5 MG capsule Take 1 capsule (1.5 mg total) by mouth daily. 30 capsule 2   cyclobenzaprine (FLEXERIL) 5 MG tablet Take 1 tablet (5 mg total) by mouth 3 (three) times daily as needed for muscle spasms. 30 tablet 0   LamoTRIgine 250 MG TB24 24 hour tablet Take 1 tablet (250 mg total) by mouth daily. 30 tablet 5   norgestimate-ethinyl estradiol (ORTHO-CYCLEN) 0.25-35 MG-MCG tablet TAKE 1 TABLET BY MOUTH EVERY DAY (Patient not taking: Reported on 01/03/2023) 84 tablet 1   predniSONE (DELTASONE) 50 MG tablet Take 1 tablet (50 mg total) by mouth daily with breakfast. (Patient not taking: Reported on 01/03/2023) 5 tablet 0   promethazine-dextromethorphan (PROMETHAZINE-DM) 6.25-15 MG/5ML syrup Take 2.5 mLs by mouth  3 (three) times daily as needed for cough. (Patient not taking: Reported on 01/03/2023) 100 mL 0   QUEtiapine (SEROQUEL) 300 MG tablet Take 1 tablet (300 mg total) by mouth at bedtime. 30 tablet 5   Semaglutide, 1 MG/DOSE, (OZEMPIC, 1 MG/DOSE,) 4 MG/3ML SOPN Inject 1 mg into the skin once a week. (Patient not taking: Reported on 01/03/2023) 3 mL 5   No current  facility-administered medications for this visit.    Medication Side Effects: None  Allergies:  Allergies  Allergen Reactions   Cephalexin Rash    Past Medical History:  Diagnosis Date   ADHD (attention deficit hyperactivity disorder)    Anxiety    Bipolar 2 disorder (HCC)    Depression    Gestational diabetes    Hypertension    Migraine    Miscarriage     Past Medical History, Surgical history, Social history, and Family history were reviewed and updated as appropriate.   Please see review of systems for further details on the patient's review from today.   Objective:   Physical Exam:  There were no vitals taken for this visit.  Physical Exam Constitutional:      General: She is not in acute distress. Musculoskeletal:        General: No deformity.  Neurological:     Mental Status: She is alert and oriented to person, place, and time.     Coordination: Coordination normal.  Psychiatric:        Attention and Perception: Attention and perception normal. She does not perceive auditory or visual hallucinations.        Mood and Affect: Affect is not labile, blunt, angry or inappropriate.        Speech: Speech normal.        Behavior: Behavior normal.        Thought Content: Thought content normal. Thought content is not paranoid or delusional. Thought content does not include homicidal or suicidal ideation. Thought content does not include homicidal or suicidal plan.        Cognition and Memory: Cognition and memory normal.        Judgment: Judgment normal.     Comments: Insight intact     Lab Review:     Component Value Date/Time   NA 139 07/31/2022 1926   NA 138 09/16/2013 2333   K 3.7 07/31/2022 1926   K 3.2 (L) 09/16/2013 2333   CL 106 07/31/2022 1926   CL 109 (H) 09/16/2013 2333   CO2 23 07/31/2022 1926   CO2 22 09/16/2013 2333   GLUCOSE 100 (H) 07/31/2022 1926   GLUCOSE 153 (H) 09/16/2013 2333   BUN 6 07/31/2022 1926   BUN 7 09/16/2013 2333    CREATININE 0.74 07/31/2022 1926   CREATININE 0.66 09/16/2013 2333   CALCIUM 9.0 07/31/2022 1926   CALCIUM 8.8 (L) 09/16/2013 2333   PROT 7.8 05/02/2020 1914   ALBUMIN 4.0 05/02/2020 1914   AST 22 05/02/2020 1914   ALT 17 05/02/2020 1914   ALKPHOS 104 05/02/2020 1914   BILITOT 0.6 05/02/2020 1914   GFRNONAA >60 07/31/2022 1926   GFRNONAA >60 09/16/2013 2333   GFRAA >60 05/02/2020 1914   GFRAA >60 09/16/2013 2333       Component Value Date/Time   WBC 12.3 (H) 07/31/2022 1926   RBC 4.57 07/31/2022 1926   HGB 12.8 07/31/2022 1926   HGB 11.1 08/27/2019 1048   HCT 39.2 07/31/2022 1926   HCT 34.0 08/27/2019 1048   PLT  373 07/31/2022 1926   PLT 276 08/27/2019 1048   MCV 85.8 07/31/2022 1926   MCV 86 08/27/2019 1048   MCV 84 09/16/2013 2333   MCH 28.0 07/31/2022 1926   MCHC 32.7 07/31/2022 1926   RDW 13.9 07/31/2022 1926   RDW 13.2 08/27/2019 1048   RDW 13.5 09/16/2013 2333   LYMPHSABS 2.8 07/05/2022 1712   LYMPHSABS 2.6 08/27/2019 1048   MONOABS 0.5 07/05/2022 1712   EOSABS 0.1 07/05/2022 1712   EOSABS 0.1 08/27/2019 1048   BASOSABS 0.1 07/05/2022 1712   BASOSABS 0.0 08/27/2019 1048    No results found for: "POCLITH", "LITHIUM"   No results found for: "PHENYTOIN", "PHENOBARB", "VALPROATE", "CBMZ"   .res Assessment: Plan:    Plan:  PDMP reviewed  Lamictal 250mg  daily Wellbutrin XL 450mg  daily. Denies seizure history. Seroquel 300mg  at bedtime Adderall 30mg  every morning.  Vraylar 1.5mg  daily  RTC 3 months  Set up with a therapist  Patient advised to contact office with any questions, adverse effects, or acute worsening in signs and symptoms.   Discussed potential benefits, risks, and side effects of stimulants with patient to include increased heart rate, palpitations, insomnia, increased anxiety, increased irritability, or decreased appetite.  Instructed patient to contact office if experiencing any significant tolerability issues.   Counseled patient  regarding potential benefits, risks, and side effects of Lamictal to include potential risk of Stevens-Johnson syndrome. Advised patient to stop taking Lamictal and contact office immediately if rash develops and to seek urgent medical attention if rash is severe and/or spreading quickly.   Please see After Visit Summary for patient specific instructions.  Future Appointments  Date Time Provider Department Center  08/10/2023  3:20 PM Cambree Hendrix, Thereasa Solo, NP CP-CP None    No orders of the defined types were placed in this encounter.   -------------------------------

## 2023-08-11 ENCOUNTER — Other Ambulatory Visit: Payer: Self-pay

## 2023-08-11 ENCOUNTER — Emergency Department
Admission: EM | Admit: 2023-08-11 | Discharge: 2023-08-11 | Disposition: A | Discharge status: Home / Self Care | Payer: 59 | Attending: Student in an Organized Health Care Education/Training Program | Admitting: Student in an Organized Health Care Education/Training Program

## 2023-08-11 ENCOUNTER — Encounter: Payer: Self-pay | Admitting: *Deleted

## 2023-08-11 DIAGNOSIS — R109 Unspecified abdominal pain: Secondary | ICD-10-CM | POA: Insufficient documentation

## 2023-08-11 DIAGNOSIS — E119 Type 2 diabetes mellitus without complications: Secondary | ICD-10-CM | POA: Diagnosis not present

## 2023-08-11 DIAGNOSIS — R519 Headache, unspecified: Secondary | ICD-10-CM | POA: Diagnosis present

## 2023-08-11 DIAGNOSIS — G43809 Other migraine, not intractable, without status migrainosus: Secondary | ICD-10-CM | POA: Diagnosis not present

## 2023-08-11 DIAGNOSIS — R112 Nausea with vomiting, unspecified: Secondary | ICD-10-CM

## 2023-08-11 LAB — URINALYSIS, ROUTINE W REFLEX MICROSCOPIC
Bilirubin Urine: NEGATIVE
Glucose, UA: NEGATIVE mg/dL
Hgb urine dipstick: NEGATIVE
Ketones, ur: NEGATIVE mg/dL
Nitrite: NEGATIVE
Protein, ur: NEGATIVE mg/dL
Specific Gravity, Urine: 1.028 (ref 1.005–1.030)
pH: 5 (ref 5.0–8.0)

## 2023-08-11 LAB — COMPREHENSIVE METABOLIC PANEL
ALT: 19 U/L (ref 0–44)
AST: 22 U/L (ref 15–41)
Albumin: 3.6 g/dL (ref 3.5–5.0)
Alkaline Phosphatase: 100 U/L (ref 38–126)
Anion gap: 9 (ref 5–15)
BUN: 14 mg/dL (ref 6–20)
CO2: 25 mmol/L (ref 22–32)
Calcium: 8.8 mg/dL — ABNORMAL LOW (ref 8.9–10.3)
Chloride: 103 mmol/L (ref 98–111)
Creatinine, Ser: 0.87 mg/dL (ref 0.44–1.00)
GFR, Estimated: >60 mL/min (ref >60)
Glucose, Bld: 101 mg/dL — ABNORMAL HIGH (ref 70–99)
Potassium: 3.8 mmol/L (ref 3.5–5.1)
Sodium: 137 mmol/L (ref 135–145)
Total Bilirubin: 0.5 mg/dL (ref 0.3–1.2)
Total Protein: 7.6 g/dL (ref 6.5–8.1)

## 2023-08-11 LAB — CBC
HCT: 41.2 % (ref 36.0–46.0)
Hemoglobin: 13.5 g/dL (ref 12.0–15.0)
MCH: 27.4 pg (ref 26.0–34.0)
MCHC: 32.8 g/dL (ref 30.0–36.0)
MCV: 83.7 fL (ref 80.0–100.0)
Platelets: 353 10*3/uL (ref 150–400)
RBC: 4.92 MIL/uL (ref 3.87–5.11)
RDW: 13.8 % (ref 11.5–15.5)
WBC: 9.4 10*3/uL (ref 4.0–10.5)
nRBC: 0 % (ref 0.0–0.2)

## 2023-08-11 LAB — LIPASE, BLOOD: Lipase: 32 U/L (ref 11–51)

## 2023-08-11 LAB — POC URINE PREG, ED: Preg Test, Ur: NEGATIVE

## 2023-08-11 MED ORDER — SODIUM CHLORIDE 0.9 % IV BOLUS
1000.0000 mL | Freq: Once | INTRAVENOUS | Status: AC
  Administered 2023-08-11: 1000 mL via INTRAVENOUS

## 2023-08-11 MED ORDER — PROCHLORPERAZINE EDISYLATE 10 MG/2ML IJ SOLN
10.0000 mg | Freq: Once | INTRAMUSCULAR | Status: AC
  Administered 2023-08-11: 10 mg via INTRAVENOUS
  Filled 2023-08-11: qty 2

## 2023-08-11 MED ORDER — DIPHENHYDRAMINE HCL 50 MG/ML IJ SOLN
12.5000 mg | Freq: Once | INTRAMUSCULAR | Status: AC
  Administered 2023-08-11: 12.5 mg via INTRAVENOUS
  Filled 2023-08-11: qty 1

## 2023-08-11 MED ORDER — PROCHLORPERAZINE MALEATE 10 MG PO TABS: 10.0000 mg | ORAL_TABLET | Freq: Four times a day (QID) | ORAL | 0 refills | Status: AC | PRN

## 2023-08-11 NOTE — ED Notes (Signed)
Poct pregnancy Negative 

## 2023-08-11 NOTE — ED Triage Notes (Signed)
Pt ambulatory to triage.  Pt started wegovy 2 days ago.  Pt now has vomiting and abd pain.  Pt alert  speech clear.

## 2023-08-11 NOTE — ED Notes (Signed)
Assumed care of pt at this time. Pt is AAXO4, NS bolus infusing, pt states she is feeling better, asking when she will be discharged. No needs identified at this time.

## 2023-08-11 NOTE — ED Notes (Signed)
 Provided pt with discharge instructions and education. All of pt questions answered. Pt in possession of all belongings. Pt AAOX4 and stable at time of discharge.Pt ambulated w/ steady gait towards ED exit.

## 2023-08-11 NOTE — ED Provider Notes (Signed)
[facility] Provider Note    Event Date/Time   First MD Initiated Contact with Patient 08/11/23 1754     (approximate)   History   Emesis   HPI  Frances Lamb is a 27 y.o. female with a history of diabetes migraines bipolar disorder presents to the ER for evaluation of abdominal pain nausea vomiting and migraine headache.  Symptoms started after she took her first dose of Wegovy a few days ago.  Was given some Zofran with improvement in the vomiting still having nausea poor p.o. intake and now migraine headache.         Physical Exam   Triage Vital Signs: ED Triage Vitals  Encounter Vitals Group     BP 08/11/23 1552 100/72     Systolic BP Percentile --      Diastolic BP Percentile --      Pulse Rate 08/11/23 1552 86     Resp 08/11/23 1552 18     Temp 08/11/23 1552 98.7 F (37.1 C)     Temp Source 08/11/23 1552 Oral     SpO2 08/11/23 1552 94 %     Weight 08/11/23 1548 250 lb (113.4 kg)     Height 08/11/23 1548 5\' 2"  (1.575 m)     Head Circumference --      Peak Flow --      Pain Score 08/11/23 1548 7     Pain Loc --      Pain Education --      Exclude from Growth Chart --     Most recent vital signs: Vitals:   08/11/23 1552  BP: 100/72  Pulse: 86  Resp: 18  Temp: 98.7 F (37.1 C)  SpO2: 94%     Constitutional: Alert  Eyes: Conjunctivae are normal.  Head: Atraumatic. Nose: No congestion/rhinnorhea. Mouth/Throat: Mucous membranes are moist.   Neck: Painless ROM.  Cardiovascular:   Good peripheral circulation. Respiratory: Normal respiratory effort.  No retractions.  Gastrointestinal: Soft and nontender.  Musculoskeletal:  no deformity Neurologic:  MAE spontaneously. No gross focal neurologic deficits are appreciated.  Skin:  Skin is warm, dry and intact. No rash noted. Psychiatric: Mood and affect are normal. Speech and behavior are normal.    ED Results / Procedures / Treatments   Labs (all labs ordered are listed, but  only abnormal results are displayed) Labs Reviewed  COMPREHENSIVE METABOLIC PANEL - Abnormal; Notable for the following components:      Result Value   Glucose, Bld 101 (*)    Calcium 8.8 (*)    All other components within normal limits  URINALYSIS, ROUTINE W REFLEX MICROSCOPIC - Abnormal; Notable for the following components:   Color, Urine YELLOW (*)    APPearance CLOUDY (*)    Leukocytes,Ua TRACE (*)    Bacteria, UA FEW (*)    All other components within normal limits  LIPASE, BLOOD  CBC  POC URINE PREG, ED     EKG     RADIOLOGY    PROCEDURES:  Critical Care performed: No  Procedures   MEDICATIONS ORDERED IN ED: Medications  prochlorperazine (COMPAZINE) injection 10 mg (10 mg Intravenous Given 08/11/23 1821)  diphenhydrAMINE (BENADRYL) injection 12.5 mg (12.5 mg Intravenous Given 08/11/23 1820)  sodium chloride 0.9 % bolus 1,000 mL (1,000 mLs Intravenous New Bag/Given 08/11/23 1820)     IMPRESSION / MDM / ASSESSMENT AND PLAN / ED COURSE  I reviewed the triage vital signs and the nursing notes.  Differential diagnosis includes, but is not limited to, side effect of Wegovy, dehydration, gastritis, migraine, electrolyte abnormality  Patient presenting to the ER for evaluation of symptoms as described above.  Based on symptoms, risk factors and considered above differential, this presenting complaint could reflect a potentially life-threatening illness therefore the patient will be placed on continuous pulse oximetry and telemetry for monitoring.  Laboratory evaluation will be sent to evaluate for the above complaints.      Clinical Course as of 08/11/23 Darlina Guys Aug 11, 2023  [id number] Patient with complete resolution of her symptoms with Compazine.  Requesting discharge home.  I think this is reasonable given her reassuring workup.  Discussed outpatient follow-up. [PR]    Clinical Course User Index [PR] Willy Eddy, MD      FINAL CLINICAL IMPRESSION(S) / ED DIAGNOSES   Final diagnoses:  Nausea and vomiting, unspecified vomiting type  Other migraine without status migrainosus, not intractable     Rx / DC Orders   ED Discharge Orders          Ordered    prochlorperazine (COMPAZINE) 10 MG tablet  Every 6 hours PRN        08/11/23 1935             Note:  This document was prepared using Dragon voice recognition software and may include unintentional dictation errors.    Willy Eddy, MD 08/11/23 Frances Lamb

## 2023-11-08 ENCOUNTER — Ambulatory Visit: Payer: 59 | Admitting: Adult Health

## 2023-11-08 ENCOUNTER — Encounter: Payer: Self-pay | Admitting: Adult Health

## 2023-11-08 DIAGNOSIS — F909 Attention-deficit hyperactivity disorder, unspecified type: Secondary | ICD-10-CM | POA: Diagnosis not present

## 2023-11-08 DIAGNOSIS — G4700 Insomnia, unspecified: Secondary | ICD-10-CM

## 2023-11-08 DIAGNOSIS — F3181 Bipolar II disorder: Secondary | ICD-10-CM

## 2023-11-08 DIAGNOSIS — F411 Generalized anxiety disorder: Secondary | ICD-10-CM

## 2023-11-08 MED ORDER — AMPHETAMINE-DEXTROAMPHETAMINE 30 MG PO TABS: 30.0000 mg | ORAL_TABLET | Freq: Every day | ORAL | 0 refills | Status: DC

## 2023-11-08 NOTE — Progress Notes (Signed)
Frances Lamb [id number] 1996-11-15 27 y.o.  Subjective:   Patient ID:  Frances Lamb is a 27 y.o. (DOB [birth year]) female.  Chief Complaint: No chief complaint on file.   HPI Frances Lamb presents to the office today for follow-up of ADD, GAD, ADHD, BPD2, and insomnia.  Describes mood today as "ok". Pleasant. Denies tearfulness. Mood symptoms - denies depression, anxiety and irritability. Denies panic attack. Denies worry and rumination. Denies over thinking. Mood is consistent. Stating "I feel like I'm doing better". Feels like current medication regimen works well. Family doing well. Stable interest and motivation. Taking medications as prescribed.  Energy levels lower. Active, has a regular exercise routine.  Enjoys some usual interests and activities. Married. Lives with husband and son. Spending time with family. Appetite adequate. Weight loss. Restarted Ozempic. Sleeping better some nights than others - working night shift. Averages 8 hours. Focus and concentration improved. Managing aspects of household. 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  Denies AH or VH. Denies self harm. Denies substance use.  Previous medication trials: Lamictal, Cymbalta, Latuda, Hydroxyzine, Adderall, Vyvanse, Seroquel    PHQ2-9    Flowsheet Row Nutrition from 10/10/2019 in [place] [facility] at [facility] Total Score 2  PHQ-9 Total Score 11      Flowsheet Row ED from 08/11/2023 in [facility] Emergency Department at [facility] ED from 02/18/2023 in [facility] Emergency Department at [facility] ED from 10/05/2022 in [facility] at [facility]  C-SSRS RISK CATEGORY No Risk No Risk No Risk        Review of Systems:  Review of Systems  Musculoskeletal:  Negative for gait problem.  Neurological:  Negative for tremors.  Psychiatric/Behavioral:         Please refer to HPI    Medications: I have  reviewed the patient's current medications.  Current Outpatient Medications  Medication Sig Dispense Refill   albuterol (PROVENTIL) (2.5 MG/3ML) 0.083% nebulizer solution Inhale into the lungs. (Patient not taking: Reported on 01/08/2022)     albuterol (VENTOLIN HFA) 108 (90 Base) MCG/ACT inhaler Inhale 1-2 puffs into the lungs every 6 (six) hours as needed for wheezing or shortness of breath. 18 g 0   amphetamine-dextroamphetamine (ADDERALL) 30 MG tablet Take 1 tablet by mouth daily. 30 tablet 0   amphetamine-dextroamphetamine (ADDERALL) 30 MG tablet Take 1 tablet by mouth daily. 30 tablet 0   amphetamine-dextroamphetamine (ADDERALL) 30 MG tablet Take 1 tablet by mouth daily. 30 tablet 0   buPROPion (WELLBUTRIN XL) 150 MG 24 hr tablet Take 1 tablet (150 mg total) by mouth daily. 30 tablet 5   buPROPion (WELLBUTRIN XL) 300 MG 24 hr tablet Take one tablet every morning. 30 tablet 5   cariprazine (VRAYLAR) 1.5 MG capsule Take 1 capsule (1.5 mg total) by mouth daily. 30 capsule 5   cyclobenzaprine (FLEXERIL) 5 MG tablet Take 1 tablet (5 mg total) by mouth 3 (three) times daily as needed for muscle spasms. 30 tablet 0   LamoTRIgine 250 MG TB24 24 hour tablet Take 1 tablet (250 mg total) by mouth daily. 30 tablet 5   norgestimate-ethinyl estradiol (ORTHO-CYCLEN) 0.25-35 MG-MCG tablet TAKE 1 TABLET BY MOUTH EVERY DAY (Patient not taking: Reported on 01/03/2023) 84 tablet 1   predniSONE (DELTASONE) 50 MG tablet Take 1 tablet (50 mg total) by mouth daily with breakfast. (Patient not taking: Reported on 01/03/2023) 5 tablet 0   prochlorperazine (COMPAZINE)  10 MG tablet Take 1 tablet (10 mg total) by mouth every 6 (six) hours as needed for nausea or vomiting. 15 tablet 0   promethazine-dextromethorphan (PROMETHAZINE-DM) 6.25-15 MG/5ML syrup Take 2.5 mLs by mouth 3 (three) times daily as needed for cough. (Patient not taking: Reported on 01/03/2023) 100 mL 0   QUEtiapine (SEROQUEL) 300 MG tablet Take 1 tablet (300  mg total) by mouth at bedtime. 30 tablet 5   Semaglutide, 1 MG/DOSE, (OZEMPIC, 1 MG/DOSE,) 4 MG/3ML SOPN Inject 1 mg into the skin once a week. (Patient not taking: Reported on 01/03/2023) 3 mL 5   No current facility-administered medications for this visit.    Medication Side Effects: None  Allergies:  Allergies  Allergen Reactions   Cephalexin Rash    Past Medical History:  Diagnosis Date   ADHD (attention deficit hyperactivity disorder)    Anxiety    Bipolar 2 disorder (HCC)    Depression    Gestational diabetes    Hypertension    Migraine    Miscarriage     Past Medical History, Surgical history, Social history, and Family history were reviewed and updated as appropriate.   Please see review of systems for further details on the patient's review from today.   Objective:   Physical Exam:  There were no vitals taken for this visit.  Physical Exam Constitutional:      General: She is not in acute distress. Musculoskeletal:        General: No deformity.  Neurological:     Mental Status: She is alert and oriented to person, place, and time.     Coordination: Coordination normal.  Psychiatric:        Attention and Perception: Attention and perception normal. She does not perceive auditory or visual hallucinations.        Mood and Affect: Affect is not labile, blunt, angry or inappropriate.        Speech: Speech normal.        Behavior: Behavior normal.        Thought Content: Thought content normal. Thought content is not paranoid or delusional. Thought content does not include homicidal or suicidal ideation. Thought content does not include homicidal or suicidal plan.        Cognition and Memory: Cognition and memory normal.        Judgment: Judgment normal.     Comments: Insight intact     Lab Review:     Component Value Date/Time   NA 137 08/11/2023 1553   NA 138 09/16/2013 2333   K 3.8 08/11/2023 1553   K 3.2 (L) 09/16/2013 2333   CL 103 08/11/2023 1553    CL 109 (H) 09/16/2013 2333   CO2 25 08/11/2023 1553   CO2 22 09/16/2013 2333   GLUCOSE 101 (H) 08/11/2023 1553   GLUCOSE 153 (H) 09/16/2013 2333   BUN 14 08/11/2023 1553   BUN 7 09/16/2013 2333   CREATININE 0.87 08/11/2023 1553   CREATININE 0.66 09/16/2013 2333   CALCIUM 8.8 (L) 08/11/2023 1553   CALCIUM 8.8 (L) 09/16/2013 2333   PROT 7.6 08/11/2023 1553   ALBUMIN 3.6 08/11/2023 1553   AST 22 08/11/2023 1553   ALT 19 08/11/2023 1553   ALKPHOS 100 08/11/2023 1553   BILITOT 0.5 08/11/2023 1553   GFRNONAA >60 08/11/2023 1553   GFRNONAA >60 09/16/2013 2333   GFRAA >60 05/02/2020 1914   GFRAA >60 09/16/2013 2333       Component Value Date/Time   WBC  9.4 08/11/2023 1553   RBC 4.92 08/11/2023 1553   HGB 13.5 08/11/2023 1553   HGB 11.1 08/27/2019 1048   HCT 41.2 08/11/2023 1553   HCT 34.0 08/27/2019 1048   PLT 353 08/11/2023 1553   PLT 276 08/27/2019 1048   MCV 83.7 08/11/2023 1553   MCV 86 08/27/2019 1048   MCV 84 09/16/2013 2333   MCH 27.4 08/11/2023 1553   MCHC 32.8 08/11/2023 1553   RDW 13.8 08/11/2023 1553   RDW 13.2 08/27/2019 1048   RDW 13.5 09/16/2013 2333   LYMPHSABS 2.8 07/05/2022 1712   LYMPHSABS 2.6 08/27/2019 1048   MONOABS 0.5 07/05/2022 1712   EOSABS 0.1 07/05/2022 1712   EOSABS 0.1 08/27/2019 1048   BASOSABS 0.1 07/05/2022 1712   BASOSABS 0.0 08/27/2019 1048    No results found for: "POCLITH", "LITHIUM"   No results found for: "PHENYTOIN", "PHENOBARB", "VALPROATE", "CBMZ"   .res Assessment: Plan:    Plan:  PDMP reviewed  Lamictal 250mg  daily Wellbutrin XL 450mg  daily. Denies seizure history. Seroquel 300mg  at bedtime Adderall 30mg  every morning.  Vraylar 1.5mg  daily  RTC 3 months  Set up with a therapist  Patient advised to contact office with any questions, adverse effects, or acute worsening in signs and symptoms.   Discussed potential benefits, risks, and side effects of stimulants with patient to include increased heart rate,  palpitations, insomnia, increased anxiety, increased irritability, or decreased appetite.  Instructed patient to contact office if experiencing any significant tolerability issues.   Counseled patient regarding potential benefits, risks, and side effects of Lamictal to include potential risk of Stevens-Johnson syndrome. Advised patient to stop taking Lamictal and contact office immediately if rash develops and to seek urgent medical attention if rash is severe and/or spreading quickly.   There are no diagnoses linked to this encounter.   Please see After Visit Summary for patient specific instructions.  Future Appointments  Date Time Provider Department Center  11/08/2023  2:40 PM Lamerle Jabs, Thereasa Solo, NP CP-CP None    No orders of the defined types were placed in this encounter.   -------------------------------

## 2023-11-26 ENCOUNTER — Encounter (HOSPITAL_BASED_OUTPATIENT_CLINIC_OR_DEPARTMENT_OTHER): Payer: Self-pay | Admitting: Emergency Medicine

## 2023-11-26 ENCOUNTER — Emergency Department (HOSPITAL_BASED_OUTPATIENT_CLINIC_OR_DEPARTMENT_OTHER)
Admission: EM | Admit: 2023-11-26 | Discharge: 2023-11-26 | Disposition: A | Discharge status: Home / Self Care | Payer: 59

## 2023-11-26 ENCOUNTER — Other Ambulatory Visit: Payer: Self-pay

## 2023-11-26 DIAGNOSIS — Z1152 Encounter for screening for COVID-19: Secondary | ICD-10-CM | POA: Diagnosis not present

## 2023-11-26 DIAGNOSIS — J069 Acute upper respiratory infection, unspecified: Secondary | ICD-10-CM | POA: Diagnosis not present

## 2023-11-26 DIAGNOSIS — R509 Fever, unspecified: Secondary | ICD-10-CM | POA: Diagnosis present

## 2023-11-26 LAB — RESP PANEL BY RT-PCR (RSV, FLU A&B, COVID)  RVPGX2
Influenza A by PCR: NEGATIVE
Influenza B by PCR: NEGATIVE
Resp Syncytial Virus by PCR: NEGATIVE
SARS Coronavirus 2 by RT PCR: NEGATIVE

## 2023-11-26 LAB — GROUP A STREP BY PCR: Group A Strep by PCR: NOT DETECTED

## 2023-11-26 MED ORDER — ONDANSETRON 4 MG PO TBDP: 4.0000 mg | ORAL_TABLET | Freq: Three times a day (TID) | ORAL | 0 refills | Status: AC | PRN

## 2023-11-26 NOTE — ED Notes (Signed)
Reviewed discharge instructions, medications, and home care with pt. Pt verbalized understanding and had no further questions. Pt exited ED without complications.

## 2023-11-26 NOTE — Discharge Instructions (Addendum)
Thank you for letting us evaluate you today.  Your COVID, flu, RSV, strep were all negative.  Your symptoms are likely due to a viral infection.  Please follow-up with their PCP regarding further management.  You may use humidifier, hot tea, honey, over-the-counter medications, switching between Tylenol and ibuprofen every 4 hours for fever and pain  Return to ED if you experience significant worsening symptoms, inability to keep oral hydration down affecting your hydration status

## 2023-11-26 NOTE — ED Triage Notes (Signed)
2 to 3 days not feeling well, fevers up to 105.congested, not coughing a lot, very achy.

## 2023-11-26 NOTE — ED Provider Notes (Signed)
[place] EMERGENCY DEPARTMENT AT [facility] Provider Note   CSN: [id number] Arrival date & time: 11/26/23  1606     History  No chief complaint on file.   Frances Lamb is a 27 y.o. female with past medical history of bipolar 2, BMI 45, GAD, anxiety presents to emergency department for evaluation of high fever, generalized bodyaches, sore throat, nasal congestion, decreased appetite, nausea, and occasional diarrhea.  She reports that a lot of people at work have called out for being "sick".  She reports fever has been ranging from 99-1 05 but will break with ibuprofen.  Last ibuprofen was at 0800 this morning.  She denies chest pain, shortness of breath, vomiting.  HPI    Home Medications Prior to Admission medications   Medication Sig Start Date End Date Taking? Authorizing Provider  ondansetron (ZOFRAN-ODT) 4 MG disintegrating tablet Take 1 tablet (4 mg total) by mouth every 8 (eight) hours as needed for nausea or vomiting. 11/26/23  Yes Sabra Heck E, PA  albuterol (PROVENTIL) (2.5 MG/3ML) 0.083% nebulizer solution Inhale into the lungs. Patient not taking: Reported on 01/08/2022 07/02/20 07/02/21  [provider]  albuterol (VENTOLIN HFA) 108 (90 Base) MCG/ACT inhaler Inhale 1-2 puffs into the lungs every 6 (six) hours as needed for wheezing or shortness of breath. 10/05/22   Wallis Bamberg, PA-C  amphetamine-dextroamphetamine (ADDERALL) 30 MG tablet Take 1 tablet by mouth daily. 11/08/23   Mozingo, Thereasa Solo, NP  amphetamine-dextroamphetamine (ADDERALL) 30 MG tablet Take 1 tablet by mouth daily. 12/06/23   Mozingo, Thereasa Solo, NP  amphetamine-dextroamphetamine (ADDERALL) 30 MG tablet Take 1 tablet by mouth daily. 01/03/24   Mozingo, Thereasa Solo, NP  buPROPion (WELLBUTRIN XL) 150 MG 24 hr tablet Take 1 tablet (150 mg total) by mouth daily. 08/10/23   Mozingo, Thereasa Solo, NP  buPROPion (WELLBUTRIN XL) 300 MG 24 hr tablet Take one tablet every morning.  08/10/23   Mozingo, Thereasa Solo, NP  cariprazine (VRAYLAR) 1.5 MG capsule Take 1 capsule (1.5 mg total) by mouth daily. 08/10/23   Mozingo, Thereasa Solo, NP  cyclobenzaprine (FLEXERIL) 5 MG tablet Take 1 tablet (5 mg total) by mouth 3 (three) times daily as needed for muscle spasms. 02/19/23   Jene Every, MD  LamoTRIgine 250 MG TB24 24 hour tablet Take 1 tablet (250 mg total) by mouth daily. 08/10/23   Mozingo, Thereasa Solo, NP  norgestimate-ethinyl estradiol (ORTHO-CYCLEN) 0.25-35 MG-MCG tablet TAKE 1 TABLET BY MOUTH EVERY DAY Patient not taking: Reported on 01/03/2023 06/25/21   Nadara Mustard, MD  predniSONE (DELTASONE) 50 MG tablet Take 1 tablet (50 mg total) by mouth daily with breakfast. Patient not taking: Reported on 01/03/2023 10/05/22   Wallis Bamberg, PA-C  prochlorperazine (COMPAZINE) 10 MG tablet Take 1 tablet (10 mg total) by mouth every 6 (six) hours as needed for nausea or vomiting. 08/11/23   Willy Eddy, MD  promethazine-dextromethorphan (PROMETHAZINE-DM) 6.25-15 MG/5ML syrup Take 2.5 mLs by mouth 3 (three) times daily as needed for cough. Patient not taking: Reported on 01/03/2023 10/05/22   Wallis Bamberg, PA-C  QUEtiapine (SEROQUEL) 300 MG tablet Take 1 tablet (300 mg total) by mouth at bedtime. 08/10/23   Mozingo, Thereasa Solo, NP  Semaglutide, 1 MG/DOSE, (OZEMPIC, 1 MG/DOSE,) 4 MG/3ML SOPN Inject 1 mg into the skin once a week. Patient not taking: Reported on 01/03/2023 03/01/22   Nadara Mustard, MD      Allergies    Cephalexin    Review of Systems  Review of Systems  Constitutional:  Positive for appetite change, fatigue and fever. Negative for chills.  HENT:  Positive for congestion and sore throat. Negative for drooling, trouble swallowing and voice change.   Respiratory:  Negative for cough, chest tightness, shortness of breath and wheezing.   Cardiovascular:  Negative for chest pain and palpitations.  Gastrointestinal:  Negative for abdominal pain,  constipation, diarrhea, nausea and vomiting.  Musculoskeletal:  Positive for myalgias.  Neurological:  Negative for dizziness, seizures, weakness, light-headedness, numbness and headaches.    Physical Exam Updated Vital Signs BP 114/79 (BP Location: Right Arm)   Pulse 96   Temp 98.8 F (37.1 C)   Resp 19   Wt 113.4 kg   SpO2 98%   BMI 45.73 kg/m  Physical Exam Vitals and nursing note reviewed.  Constitutional:      General: She is not in acute distress.    Appearance: Normal appearance. She is not ill-appearing.  HENT:     Head: Normocephalic and atraumatic.     Right Ear: Tympanic membrane, ear canal and external ear normal.     Left Ear: Tympanic membrane, ear canal and external ear normal.     Nose: Congestion present.     Right Sinus: No maxillary sinus tenderness or frontal sinus tenderness.     Left Sinus: No maxillary sinus tenderness or frontal sinus tenderness.     Mouth/Throat:     Mouth: Mucous membranes are moist.     Pharynx: Uvula midline. No oropharyngeal exudate, posterior oropharyngeal erythema or uvula swelling.     Tonsils: No tonsillar abscesses. 1+ on the right. 1+ on the left.  Eyes:     General: No scleral icterus.       Right eye: No discharge.        Left eye: No discharge.     Conjunctiva/sclera: Conjunctivae normal.  Cardiovascular:     Rate and Rhythm: Normal rate.     Pulses: Normal pulses.  Pulmonary:     Effort: Pulmonary effort is normal. No respiratory distress.     Breath sounds: Normal breath sounds. No stridor. No wheezing or rhonchi.  Chest:     Chest wall: No tenderness.  Abdominal:     General: There is no distension.     Palpations: Abdomen is soft. There is no mass.     Tenderness: There is no abdominal tenderness. There is no guarding.  Musculoskeletal:     Cervical back: Normal range of motion and neck supple. No rigidity or tenderness.     Right lower leg: No edema.     Left lower leg: No edema.  Lymphadenopathy:      Cervical: No cervical adenopathy.  Skin:    General: Skin is warm.     Capillary Refill: Capillary refill takes less than 2 seconds.     Coloration: Skin is not jaundiced or pale.  Neurological:     Mental Status: She is alert and oriented to person, place, and time. Mental status is at baseline.     ED Results / Procedures / Treatments   Labs (all labs ordered are listed, but only abnormal results are displayed) Labs Reviewed  GROUP A STREP BY PCR  RESP PANEL BY RT-PCR (RSV, FLU A&B, COVID)  RVPGX2    EKG None  Radiology No results found.  Procedures Procedures    Medications Ordered in ED Medications - No data to display  ED Course/ Medical Decision Making/ A&P  Medical Decision Making Risk Prescription drug management.   Patient presents to the ED for concern of uri symptoms, this involves an extensive number of treatment options, and is a complaint that carries with it a high risk of complications and morbidity.  The differential diagnosis includes URI, virus, flu, pneumonia   Co morbidities that complicate the patient evaluation   bipolar 2, BMI 45, GAD, anxiety   Additional history obtained:  Additional history obtained from Nursing and Outside Medical Records   External records from outside source obtained and reviewed including  Triage RN note Recent medical evaluations and medication list   Lab Tests:  I Ordered, and personally interpreted labs.  The pertinent results include:   Negative respiratory and strep panel    Medicines ordered and prescription drug management:  I ordered medication including zofran  for naiusea  I have reviewed the patients home medicines and have made adjustments as needed    Problem List / ED Course:  Viral URI Physical exam is reassuring.  Congestion is noted.  Lung sounds CTAB. I have low suspicion for pneumonia based on timeline of symptoms, no current tachycardia nor fever  here in emergency department. Respiratory panel and strep negative.  Discussed findings with patient expresses understanding.   Discussed return to emergency department precautions with patient expresses understanding agrees with plan.  Discussed symptomatic OTC care.  All questions answered to her satisfaction.  She is agreeable to discharge at this time.  But requests work note. Will provide Zofran for intermittent nausea   Reevaluation:  After the interventions noted above, I reevaluated the patient and found that they have :stayed the same   Social Determinants of Health:  Has PCP follow-up   Dispostion:  After consideration of the diagnostic results and the patients response to treatment, I feel that the patent would benefit from outpatient management and symptomatic control.    Final Clinical Impression(s) / ED Diagnoses Final diagnoses:  Viral URI    Rx / DC Orders ED Discharge Orders          Ordered    ondansetron (ZOFRAN-ODT) 4 MG disintegrating tablet  Every 8 hours PRN        11/26/23 1922              Judithann Sheen, PA 11/26/23 1928    Durwin Glaze, MD 11/26/23 2219

## 2023-12-31 ENCOUNTER — Emergency Department (HOSPITAL_BASED_OUTPATIENT_CLINIC_OR_DEPARTMENT_OTHER): Payer: 59

## 2023-12-31 ENCOUNTER — Other Ambulatory Visit: Payer: Self-pay

## 2023-12-31 ENCOUNTER — Emergency Department (HOSPITAL_BASED_OUTPATIENT_CLINIC_OR_DEPARTMENT_OTHER)
Admission: EM | Admit: 2023-12-31 | Discharge: 2023-12-31 | Disposition: A | Discharge status: Home / Self Care | Payer: 59 | Attending: Emergency Medicine | Admitting: Emergency Medicine

## 2023-12-31 ENCOUNTER — Encounter (HOSPITAL_BASED_OUTPATIENT_CLINIC_OR_DEPARTMENT_OTHER): Payer: Self-pay

## 2023-12-31 DIAGNOSIS — Z20822 Contact with and (suspected) exposure to covid-19: Secondary | ICD-10-CM | POA: Insufficient documentation

## 2023-12-31 DIAGNOSIS — R079 Chest pain, unspecified: Secondary | ICD-10-CM | POA: Diagnosis present

## 2023-12-31 LAB — BASIC METABOLIC PANEL
Anion gap: 11 (ref 5–15)
BUN: 8 mg/dL (ref 6–20)
CO2: 20 mmol/L — ABNORMAL LOW (ref 22–32)
Calcium: 9.6 mg/dL (ref 8.9–10.3)
Chloride: 106 mmol/L (ref 98–111)
Creatinine, Ser: 0.77 mg/dL (ref 0.44–1.00)
GFR, Estimated: >60 mL/min (ref >60)
Glucose, Bld: 96 mg/dL (ref 70–99)
Potassium: 3.5 mmol/L (ref 3.5–5.1)
Sodium: 137 mmol/L (ref 135–145)

## 2023-12-31 LAB — TROPONIN I (HIGH SENSITIVITY)
Troponin I (High Sensitivity): <2 ng/L (ref <18)
Troponin I (High Sensitivity): <2 ng/L (ref <18)

## 2023-12-31 LAB — RESP PANEL BY RT-PCR (RSV, FLU A&B, COVID)  RVPGX2
Influenza A by PCR: NEGATIVE
Influenza B by PCR: NEGATIVE
Resp Syncytial Virus by PCR: NEGATIVE
SARS Coronavirus 2 by RT PCR: NEGATIVE

## 2023-12-31 LAB — PREGNANCY, URINE: Preg Test, Ur: NEGATIVE

## 2023-12-31 LAB — CBC
HCT: 40 % (ref 36.0–46.0)
Hemoglobin: 13.7 g/dL (ref 12.0–15.0)
MCH: 28.4 pg (ref 26.0–34.0)
MCHC: 34.3 g/dL (ref 30.0–36.0)
MCV: 82.8 fL (ref 80.0–100.0)
Platelets: 332 10*3/uL (ref 150–400)
RBC: 4.83 MIL/uL (ref 3.87–5.11)
RDW: 13.7 % (ref 11.5–15.5)
WBC: 13 10*3/uL — ABNORMAL HIGH (ref 4.0–10.5)
nRBC: 0 % (ref 0.0–0.2)

## 2023-12-31 MED ORDER — OMEPRAZOLE 20 MG PO CPDR: 20.0000 mg | DELAYED_RELEASE_CAPSULE | Freq: Every day | ORAL | 0 refills | Status: AC

## 2023-12-31 MED ORDER — ALUM & MAG HYDROXIDE-SIMETH 200-200-20 MG/5ML PO SUSP
30.0000 mL | Freq: Once | ORAL | Status: AC
  Administered 2023-12-31: 30 mL via ORAL
  Filled 2023-12-31: qty 30

## 2023-12-31 NOTE — ED Triage Notes (Signed)
Pt POV but had called EMS to come to her house d/t CP onset 2200.  Pt also states she has a tremor in bilat hands that is abnormal.  EMS wanted her to come get evaluated d/t some history of Anemia.

## 2023-12-31 NOTE — ED Provider Notes (Signed)
[place] EMERGENCY DEPARTMENT AT [facility] Provider Note   CSN: [id number] Arrival date & time: 12/31/23  2440     History  Chief Complaint  Patient presents with   Chest Pain    Frances Lamb is a 28 y.o. female.  HPI   28 year old female presents emergency department with left-sided chest pain.  Patient states that this started around 10 PM last night.  Initially started as a pressure-like sensation, around 4 AM this morning transition to more of a sharp left-sided chest pain.  Currently the discomfort is mild.  She has associated shortness of breath.  Denies any cough or hemoptysis.  Denies any recent fever, GI illness.  Although she does admit to mild nausea that is ongoing.  No cardiac history.  Not currently on birth control.  Home Medications Prior to Admission medications   Medication Sig Start Date End Date Taking? Authorizing Provider  albuterol (PROVENTIL) (2.5 MG/3ML) 0.083% nebulizer solution Inhale into the lungs. Patient not taking: Reported on 01/08/2022 07/02/20 07/02/21  [provider]  albuterol (VENTOLIN HFA) 108 (90 Base) MCG/ACT inhaler Inhale 1-2 puffs into the lungs every 6 (six) hours as needed for wheezing or shortness of breath. 10/05/22   Wallis Bamberg, PA-C  amphetamine-dextroamphetamine (ADDERALL) 30 MG tablet Take 1 tablet by mouth daily. 11/08/23   Mozingo, Thereasa Solo, NP  amphetamine-dextroamphetamine (ADDERALL) 30 MG tablet Take 1 tablet by mouth daily. 12/06/23   Mozingo, Thereasa Solo, NP  amphetamine-dextroamphetamine (ADDERALL) 30 MG tablet Take 1 tablet by mouth daily. 01/03/24   Mozingo, Thereasa Solo, NP  buPROPion (WELLBUTRIN XL) 150 MG 24 hr tablet Take 1 tablet (150 mg total) by mouth daily. 08/10/23   Mozingo, Thereasa Solo, NP  buPROPion (WELLBUTRIN XL) 300 MG 24 hr tablet Take one tablet every morning. 08/10/23   Mozingo, Thereasa Solo, NP  cariprazine (VRAYLAR) 1.5 MG capsule Take 1 capsule (1.5 mg total) by mouth  daily. 08/10/23   Mozingo, Thereasa Solo, NP  cyclobenzaprine (FLEXERIL) 5 MG tablet Take 1 tablet (5 mg total) by mouth 3 (three) times daily as needed for muscle spasms. 02/19/23   Jene Every, MD  LamoTRIgine 250 MG TB24 24 hour tablet Take 1 tablet (250 mg total) by mouth daily. 08/10/23   Mozingo, Thereasa Solo, NP  norgestimate-ethinyl estradiol (ORTHO-CYCLEN) 0.25-35 MG-MCG tablet TAKE 1 TABLET BY MOUTH EVERY DAY Patient not taking: Reported on 01/03/2023 06/25/21   Nadara Mustard, MD  ondansetron (ZOFRAN-ODT) 4 MG disintegrating tablet Take 1 tablet (4 mg total) by mouth every 8 (eight) hours as needed for nausea or vomiting. 11/26/23   Judithann Sheen, PA  predniSONE (DELTASONE) 50 MG tablet Take 1 tablet (50 mg total) by mouth daily with breakfast. Patient not taking: Reported on 01/03/2023 10/05/22   Wallis Bamberg, PA-C  prochlorperazine (COMPAZINE) 10 MG tablet Take 1 tablet (10 mg total) by mouth every 6 (six) hours as needed for nausea or vomiting. 08/11/23   Willy Eddy, MD  promethazine-dextromethorphan (PROMETHAZINE-DM) 6.25-15 MG/5ML syrup Take 2.5 mLs by mouth 3 (three) times daily as needed for cough. Patient not taking: Reported on 01/03/2023 10/05/22   Wallis Bamberg, PA-C  QUEtiapine (SEROQUEL) 300 MG tablet Take 1 tablet (300 mg total) by mouth at bedtime. 08/10/23   Mozingo, Thereasa Solo, NP  Semaglutide, 1 MG/DOSE, (OZEMPIC, 1 MG/DOSE,) 4 MG/3ML SOPN Inject 1 mg into the skin once a week. Patient not taking: Reported on 01/03/2023 03/01/22   Nadara Mustard, MD  Allergies    Cephalexin    Review of Systems   Review of Systems  Constitutional:  Negative for fatigue and fever.  Respiratory:  Positive for chest tightness. Negative for cough and shortness of breath.   Cardiovascular:  Positive for chest pain. Negative for palpitations and leg swelling.  Gastrointestinal:  Negative for abdominal pain, diarrhea and vomiting.  Genitourinary:  Negative for flank pain.   Musculoskeletal:  Negative for back pain.  Skin:  Negative for rash.  Neurological:  Negative for syncope and headaches.    Physical Exam Updated Vital Signs BP (!) 147/91 (BP Location: Left Arm)   Pulse 87   Temp 98.4 F (36.9 C)   Resp 19   Ht 5\' 2"  (1.575 m)   Wt 113.4 kg   LMP 11/29/2023   SpO2 100%   BMI 45.73 kg/m  Physical Exam Vitals and nursing note reviewed.  Constitutional:      General: She is not in acute distress.    Appearance: Normal appearance. She is not ill-appearing.  HENT:     Head: Normocephalic.     Mouth/Throat:     Mouth: Mucous membranes are moist.  Cardiovascular:     Rate and Rhythm: Normal rate and regular rhythm.  Pulmonary:     Effort: Pulmonary effort is normal. No respiratory distress.     Breath sounds: No decreased breath sounds.  Chest:     Chest wall: No tenderness.  Abdominal:     Palpations: Abdomen is soft.     Tenderness: There is no abdominal tenderness.  Musculoskeletal:     Right lower leg: No edema.     Left lower leg: No edema.  Skin:    General: Skin is warm.  Neurological:     Mental Status: She is alert and oriented to person, place, and time. Mental status is at baseline.  Psychiatric:        Mood and Affect: Mood normal.     ED Results / Procedures / Treatments   Labs (all labs ordered are listed, but only abnormal results are displayed) Labs Reviewed  BASIC METABOLIC PANEL - Abnormal; Notable for the following components:      Result Value   CO2 20 (*)    All other components within normal limits  CBC - Abnormal; Notable for the following components:   WBC 13.0 (*)    All other components within normal limits  RESP PANEL BY RT-PCR (RSV, FLU A&B, COVID)  RVPGX2  PREGNANCY, URINE  TROPONIN I (HIGH SENSITIVITY)    EKG EKG Interpretation Date/Time:  Saturday December 31 2023 06:57:24 EST Ventricular Rate:  98 PR Interval:  155 QRS Duration:  106 QT Interval:  389 QTC Calculation: 497 R  Axis:   43  Text Interpretation: Sinus rhythm Borderline T abnormalities, anterior leads Prolonged QT interval Similar to previus Confirmed by Coralee Pesa [phone]) on 12/31/2023 7:23:43 AM  Radiology DG Chest Portable 1 View Result Date: 12/31/2023 CLINICAL DATA:  28 year old female with history of chest pain. EXAM: PORTABLE CHEST 1 VIEW COMPARISON:  Chest x-ray 02/18/2024. FINDINGS: Lung volumes are normal. No consolidative airspace disease. No pleural effusions. No pneumothorax. No pulmonary nodule or mass noted. Pulmonary vasculature and the cardiomediastinal silhouette are within normal limits. IMPRESSION: No radiographic evidence of acute cardiopulmonary disease. Electronically Signed   By: Trudie Reed M.D.   On: 12/31/2023 07:14    Procedures Procedures    Medications Ordered in ED Medications  alum & mag hydroxide-simeth (MAALOX/MYLANTA) 200-200-20  MG/5ML suspension 30 mL (has no administration in time range)    ED Course/ Medical Decision Making/ A&P                                 Medical Decision Making Amount and/or Complexity of Data Reviewed Labs: ordered.  Risk OTC drugs. Prescription drug management.   28 year old female presents emergency department left-sided chest heaviness that became more sharp earlier this morning.  Vitals are normal stable on arrival.  EKG shows no acute ischemic changes.  Blood work is reassuring with a less < 2 negative troponin x 2.  Chest x-ray is unremarkable.  She is PERC 0 and I have low suspicion for PE at this time.  Patient did endorse some reflux-like symptoms, after a GI cocktail her symptoms are significantly improved.  Will place the patient on omeprazole and plan for outpatient follow-up for further evaluation and treatment.  No signs of ACS at this time.  Patient at this time appears safe and stable for discharge and close outpatient follow up. Discharge plan and strict return to ED precautions discussed, patient  verbalizes understanding and agreement.        Final Clinical Impression(s) / ED Diagnoses Final diagnoses:  None    Rx / DC Orders ED Discharge Orders     None         Rozelle Logan, DO 12/31/23 1119

## 2023-12-31 NOTE — Discharge Instructions (Addendum)
You have been seen and discharged from the emergency department.  Your heart and lung workup were normal.  Your symptoms may be coming from reflux.  You were given medicine here in the department with improvement.  Take new prescription as directed.  Follow-up with your primary provider for further evaluation and further care. Take home medications as prescribed. If you have any worsening symptoms or further concerns for your health please return to an emergency department for further evaluation.

## 2024-02-02 ENCOUNTER — Telehealth: Payer: 59 | Admitting: Adult Health

## 2024-02-02 ENCOUNTER — Encounter: Payer: Self-pay | Admitting: Adult Health

## 2024-02-02 DIAGNOSIS — F3181 Bipolar II disorder: Secondary | ICD-10-CM | POA: Diagnosis not present

## 2024-02-02 DIAGNOSIS — F411 Generalized anxiety disorder: Secondary | ICD-10-CM | POA: Diagnosis not present

## 2024-02-02 DIAGNOSIS — F909 Attention-deficit hyperactivity disorder, unspecified type: Secondary | ICD-10-CM | POA: Diagnosis not present

## 2024-02-02 DIAGNOSIS — G4700 Insomnia, unspecified: Secondary | ICD-10-CM

## 2024-02-02 MED ORDER — BUPROPION HCL ER (XL) 300 MG PO TB24: ORAL_TABLET | ORAL | 5 refills | Status: DC

## 2024-02-02 MED ORDER — QUETIAPINE FUMARATE 300 MG PO TABS: 300.0000 mg | ORAL_TABLET | Freq: Every day | ORAL | 5 refills | Status: DC

## 2024-02-02 MED ORDER — LAMOTRIGINE ER 250 MG PO TB24: 1.0000 | ORAL_TABLET | Freq: Every day | ORAL | 5 refills | Status: DC

## 2024-02-02 MED ORDER — AMPHETAMINE-DEXTROAMPHETAMINE 30 MG PO TABS: 30.0000 mg | ORAL_TABLET | Freq: Every day | ORAL | 0 refills | Status: DC

## 2024-02-02 MED ORDER — BUPROPION HCL ER (XL) 150 MG PO TB24
150.0000 mg | ORAL_TABLET | Freq: Every day | ORAL | 5 refills | Status: DC
Start: 2024-02-02 — End: 2024-08-03

## 2024-02-02 MED ORDER — CARIPRAZINE HCL 1.5 MG PO CAPS: 1.5000 mg | ORAL_CAPSULE | Freq: Every day | ORAL | 5 refills | Status: DC

## 2024-02-02 NOTE — Progress Notes (Signed)
 Frances Lamb [id number] 05-Jul-1996 28 y.o.  Virtual Visit via Video Note  I connected with pt @ on 02/02/24 at  3:30 PM EST by a video enabled telemedicine application and verified that I am speaking with the correct person using two identifiers.   I discussed the limitations of evaluation and management by telemedicine and the availability of in person appointments. The patient expressed understanding and agreed to proceed.  I discussed the assessment and treatment plan with the patient. The patient was provided an opportunity to ask questions and all were answered. The patient agreed with the plan and demonstrated an understanding of the instructions.   The patient was advised to call back or seek an in-person evaluation if the symptoms worsen or if the condition fails to improve as anticipated.  I provided 25 minutes of non-face-to-face time during this encounter.  The patient was located at home.  The provider was located at [facility].   Dorothyann Gibbs, NP   Subjective:   Patient ID:  Frances Lamb is a 28 y.o. (DOB [birth year]) female.  Chief Complaint: No chief complaint on file.   HPI Frances Lamb presents for follow-up of GAD, ADHD, BPD2, and insomnia.  Describes mood today as "ok". Pleasant. Denies tearfulness. Mood symptoms - denies depression, anxiety and irritability. Reports stable interest and motivation. Denies panic attack. Denies worry and rumination. Denies over thinking. Mood is consistent. Stating "I feel like I'm doing ok - stable". Taking medications as prescribed.  Energy levels vary. Active, has a regular exercise routine.  Enjoys some usual interests and activities. Married. Lives with husband and son. Spending time with family. Appetite adequate. Weight loss - 255 pounds - taking Ozempic. Sleeping better some nights than others - working night shift. Averages 8 hours. Focus and concentration improved. Managing aspects of household. 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 Denies SI or HI.  Denies AH or VH. Denies self harm. Denies substance use.  Previous medication trials: Lamictal, Cymbalta, Latuda, Hydroxyzine, Adderall, Vyvanse, Seroquel  Review of Systems:  Review of Systems  Musculoskeletal:  Negative for gait problem.  Neurological:  Negative for tremors.  Psychiatric/Behavioral:         Please refer to HPI    Medications: I have reviewed the patient's current medications.  Current Outpatient Medications  Medication Sig Dispense Refill   albuterol (PROVENTIL) (2.5 MG/3ML) 0.083% nebulizer solution Inhale into the lungs. (Patient not taking: Reported on 01/08/2022)     albuterol (VENTOLIN HFA) 108 (90 Base) MCG/ACT inhaler Inhale 1-2 puffs into the lungs every 6 (six) hours as needed for wheezing or shortness of breath. 18 g 0   amphetamine-dextroamphetamine (ADDERALL) 30 MG tablet Take 1 tablet by mouth daily. 30 tablet 0   amphetamine-dextroamphetamine (ADDERALL) 30 MG tablet Take 1 tablet by mouth daily. 30 tablet 0   amphetamine-dextroamphetamine (ADDERALL) 30 MG tablet Take 1 tablet by mouth daily. 30 tablet 0   buPROPion (WELLBUTRIN XL) 150 MG 24 hr tablet Take 1 tablet (150 mg total) by mouth daily. 30 tablet 5   buPROPion (WELLBUTRIN XL) 300 MG 24 hr tablet Take one tablet every morning. 30 tablet 5   cariprazine (VRAYLAR) 1.5 MG capsule Take 1 capsule (1.5 mg total) by mouth daily. 30 capsule 5   cyclobenzaprine (FLEXERIL) 5 MG tablet Take 1 tablet (5 mg total) by mouth 3 (three) times daily as needed for muscle spasms. 30 tablet 0   LamoTRIgine 250 MG TB24 24 hour tablet Take 1 tablet (  250 mg total) by mouth daily. 30 tablet 5   norgestimate-ethinyl estradiol (ORTHO-CYCLEN) 0.25-35 MG-MCG tablet TAKE 1 TABLET BY MOUTH EVERY DAY (Patient not taking: Reported on 01/03/2023) 84 tablet 1   omeprazole (PRILOSEC) 20 MG capsule Take 1 capsule (20 mg total) by mouth daily. 30 capsule 0   ondansetron (ZOFRAN-ODT) 4 MG disintegrating  tablet Take 1 tablet (4 mg total) by mouth every 8 (eight) hours as needed for nausea or vomiting. 20 tablet 0   predniSONE (DELTASONE) 50 MG tablet Take 1 tablet (50 mg total) by mouth daily with breakfast. (Patient not taking: Reported on 01/03/2023) 5 tablet 0   prochlorperazine (COMPAZINE) 10 MG tablet Take 1 tablet (10 mg total) by mouth every 6 (six) hours as needed for nausea or vomiting. 15 tablet 0   promethazine-dextromethorphan (PROMETHAZINE-DM) 6.25-15 MG/5ML syrup Take 2.5 mLs by mouth 3 (three) times daily as needed for cough. (Patient not taking: Reported on 01/03/2023) 100 mL 0   QUEtiapine (SEROQUEL) 300 MG tablet Take 1 tablet (300 mg total) by mouth at bedtime. 30 tablet 5   Semaglutide, 1 MG/DOSE, (OZEMPIC, 1 MG/DOSE,) 4 MG/3ML SOPN Inject 1 mg into the skin once a week. (Patient not taking: Reported on 01/03/2023) 3 mL 5   No current facility-administered medications for this visit.    Medication Side Effects: None  Allergies:  Allergies  Allergen Reactions   Cephalexin Rash    Past Medical History:  Diagnosis Date   ADHD (attention deficit hyperactivity disorder)    Anxiety    Bipolar 2 disorder (HCC)    Depression    Gestational diabetes    Hypertension    Migraine    Miscarriage     Family History  Problem Relation Age of Onset   Diabetes Mother    Bipolar disorder Mother    Anxiety disorder Mother    Depression Mother    OCD Mother    Diabetes Maternal Grandfather     Social History   Socioeconomic History   Marital status: Married    Spouse name: shannon   Number of children: 1   Years of education: Not on file   Highest education level: Associate degree: occupational, Scientist, product/process development, or vocational program  Occupational History   Not on file  Tobacco Use   Smoking status: Every Day    Current packs/day: 0.00    Types: Cigarettes    Last attempt to quit: 11/29/2012    Years since quitting: 11.1   Smokeless tobacco: Never  Vaping Use   Vaping  status: Every Day  Substance and Sexual Activity   Alcohol use: Yes    Alcohol/week: 4.0 standard drinks of alcohol    Types: 2 Glasses of wine, 2 Standard drinks or equivalent per week    Comment: socially   Drug use: Never   Sexual activity: Yes    Partners: Male    Birth control/protection: Pill  Other Topics Concern   Not on file  Social History Narrative   Not on file   Social Drivers of Health   Financial Resource Strain: Low Risk  (05/04/2023)   Received from [facility]   Overall Financial Resource Strain (CARDIA)    Difficulty of Paying Living Expenses: Not hard at all  Food Insecurity: No Food Insecurity (05/04/2023)   Received from [facility]   Hunger Vital Sign    Worried About Running Out of Food in the Last Year: Never true    Ran Out of  Food in the Last Year: Never true  Transportation Needs: No Transportation Needs (05/04/2023)   Received from [facility] - Transportation    In the past 12 months, has lack of transportation kept you from medical appointments or from getting medications?: No    Lack of Transportation (Non-Medical): No  Physical Activity: Inactive (10/24/2019)   Exercise Vital Sign    Days of Exercise per Week: 0 days    Minutes of Exercise per Session: 0 min  Stress: No Stress Concern Present (10/24/2019)   [employer] of Occupational Health - Occupational Stress Questionnaire    Feeling of Stress : Not at all  Social Connections: Unknown (10/24/2019)   Social Connection and Isolation Panel [NHANES]    Frequency of Communication with Friends and Family: More than three times a week    Frequency of Social Gatherings with Friends and Family: More than three times a week    Attends Religious Services: Never    Database administrator or Organizations: No    Attends Banker Meetings: Never    Marital Status: Not on file  Intimate Partner Violence: Not At Risk  (10/24/2019)   Humiliation, Afraid, Rape, and Kick questionnaire    Fear of Current or Ex-Partner: No    Emotionally Abused: No    Physically Abused: No    Sexually Abused: No    Past Medical History, Surgical history, Social history, and Family history were reviewed and updated as appropriate.   Please see review of systems for further details on the patient's review from today.   Objective:   Physical Exam:  There were no vitals taken for this visit.  Physical Exam Constitutional:      General: She is not in acute distress. Musculoskeletal:        General: No deformity.  Neurological:     Mental Status: She is alert and oriented to person, place, and time.     Coordination: Coordination normal.  Psychiatric:        Attention and Perception: Attention and perception normal. She does not perceive auditory or visual hallucinations.        Mood and Affect: Affect is not labile, blunt, angry or inappropriate.        Speech: Speech normal.        Behavior: Behavior normal.        Thought Content: Thought content normal. Thought content is not paranoid or delusional. Thought content does not include homicidal or suicidal ideation. Thought content does not include homicidal or suicidal plan.        Cognition and Memory: Cognition and memory normal.        Judgment: Judgment normal.     Comments: Insight intact     Lab Review:     Component Value Date/Time   NA 137 12/31/2023 0700   NA 138 09/16/2013 2333   K 3.5 12/31/2023 0700   K 3.2 (L) 09/16/2013 2333   CL 106 12/31/2023 0700   CL 109 (H) 09/16/2013 2333   CO2 20 (L) 12/31/2023 0700   CO2 22 09/16/2013 2333   GLUCOSE 96 12/31/2023 0700   GLUCOSE 153 (H) 09/16/2013 2333   BUN 8 12/31/2023 0700   BUN 7 09/16/2013 2333   CREATININE 0.77 12/31/2023 0700   CREATININE 0.66 09/16/2013 2333   CALCIUM 9.6 12/31/2023 0700   CALCIUM 8.8 (L) 09/16/2013 2333   PROT 7.6 08/11/2023 1553   ALBUMIN 3.6 08/11/2023 1553  AST 22  08/11/2023 1553   ALT 19 08/11/2023 1553   ALKPHOS 100 08/11/2023 1553   BILITOT 0.5 08/11/2023 1553   GFRNONAA >60 12/31/2023 0700   GFRNONAA >60 09/16/2013 2333   GFRAA >60 05/02/2020 1914   GFRAA >60 09/16/2013 2333       Component Value Date/Time   WBC 13.0 (H) 12/31/2023 0700   RBC 4.83 12/31/2023 0700   HGB 13.7 12/31/2023 0700   HGB 11.1 08/27/2019 1048   HCT 40.0 12/31/2023 0700   HCT 34.0 08/27/2019 1048   PLT 332 12/31/2023 0700   PLT 276 08/27/2019 1048   MCV 82.8 12/31/2023 0700   MCV 86 08/27/2019 1048   MCV 84 09/16/2013 2333   MCH 28.4 12/31/2023 0700   MCHC 34.3 12/31/2023 0700   RDW 13.7 12/31/2023 0700   RDW 13.2 08/27/2019 1048   RDW 13.5 09/16/2013 2333   LYMPHSABS 2.8 07/05/2022 1712   LYMPHSABS 2.6 08/27/2019 1048   MONOABS 0.5 07/05/2022 1712   EOSABS 0.1 07/05/2022 1712   EOSABS 0.1 08/27/2019 1048   BASOSABS 0.1 07/05/2022 1712   BASOSABS 0.0 08/27/2019 1048    No results found for: "POCLITH", "LITHIUM"   No results found for: "PHENYTOIN", "PHENOBARB", "VALPROATE", "CBMZ"   .res Assessment: Plan:    Plan:  PDMP reviewed  Lamictal 250mg  daily Wellbutrin XL 450mg  daily. Denies seizure history. Seroquel 300mg  at bedtime Adderall 30mg  every morning.  Vraylar 1.5mg  daily  Set up with a therapist  RTC 3 months  25 minutes spent dedicated to the care of this patient on the date of this encounter to include pre-visit review of records, ordering of medication, post visit documentation, and face-to-face time with the patient discussing ADD, GAD, BPD2 and insomnia. Discussed continuing current medication regimen.  Patient advised to contact office with any questions, adverse effects, or acute worsening in signs and symptoms.   Discussed potential benefits, risks, and side effects of stimulants with patient to include increased heart rate, palpitations, insomnia, increased anxiety, increased irritability, or decreased appetite.  Instructed  patient to contact office if experiencing any significant tolerability issues.   Counseled patient regarding potential benefits, risks, and side effects of Lamictal to include potential risk of Stevens-Johnson syndrome. Advised patient to stop taking Lamictal and contact office immediately if rash develops and to seek urgent medical attention if rash is severe and/or spreading quickly.   There are no diagnoses linked to this encounter.   Please see After Visit Summary for patient specific instructions.  Future Appointments  Date Time Provider Department Center  02/02/2024  3:30 PM Vernecia Umble, Thereasa Solo, NP [facility] None    No orders of the defined types were placed in this encounter.     -------------------------------

## 2024-05-04 ENCOUNTER — Telehealth: Admitting: Adult Health

## 2024-05-04 ENCOUNTER — Encounter: Payer: Self-pay | Admitting: Adult Health

## 2024-05-04 DIAGNOSIS — F3181 Bipolar II disorder: Secondary | ICD-10-CM

## 2024-05-04 DIAGNOSIS — G4700 Insomnia, unspecified: Secondary | ICD-10-CM

## 2024-05-04 DIAGNOSIS — F411 Generalized anxiety disorder: Secondary | ICD-10-CM

## 2024-05-04 DIAGNOSIS — F909 Attention-deficit hyperactivity disorder, unspecified type: Secondary | ICD-10-CM

## 2024-05-04 MED ORDER — AMPHETAMINE-DEXTROAMPHETAMINE 30 MG PO TABS
30.0000 mg | ORAL_TABLET | Freq: Every day | ORAL | 0 refills | Status: DC
Start: 2024-06-29 — End: 2024-08-03

## 2024-05-04 MED ORDER — AMPHETAMINE-DEXTROAMPHETAMINE 30 MG PO TABS
30.0000 mg | ORAL_TABLET | Freq: Every day | ORAL | 0 refills | Status: DC
Start: 2024-06-01 — End: 2024-08-03

## 2024-05-04 MED ORDER — AMPHETAMINE-DEXTROAMPHETAMINE 30 MG PO TABS
30.0000 mg | ORAL_TABLET | Freq: Every day | ORAL | 0 refills | Status: DC
Start: 2024-05-04 — End: 2024-08-03

## 2024-05-04 NOTE — Progress Notes (Signed)
 Frances Lamb [id number] January 27, 1996 28 y.o.  Virtual Visit via Video Note  I connected with pt @ on 05/04/24 at  2:30 PM EDT by a video enabled telemedicine application and verified that I am speaking with the correct person using two identifiers.   I discussed the limitations of evaluation and management by telemedicine and the availability of in person appointments. The patient expressed understanding and agreed to proceed.  I discussed the assessment and treatment plan with the patient. The patient was provided an opportunity to ask questions and all were answered. The patient agreed with the plan and demonstrated an understanding of the instructions.   The patient was advised to call back or seek an in-person evaluation if the symptoms worsen or if the condition fails to improve as anticipated.  I provided 25 minutes of non-face-to-face time during this encounter.  The patient was located at home.  The provider was located at [facility].   Reagan Camera, NP   Subjective:   Patient ID:  Frances Lamb is a 28 y.o. (DOB March 26, 1996) female.  Chief Complaint: No chief complaint on file.   HPI Frances Lamb presents for follow-up of GAD, ADHD, BPD2, and insomnia.  Describes mood today as "ok". Pleasant. Denies tearfulness. Mood symptoms - denies depression, and irritability. Reports increased anxiety at work and situational. Reports stable interest and motivation. Denies panic attack. Reports some worry and over thinking. Denies rumination. Mood is consistent. Stating "I feel like I'm doing fine - some outside stressors. Taking medications as prescribed.  Energy levels have improved. Active, has a regular exercise routine.  Enjoys some usual interests and activities. Married. Lives with husband and son. Spending time with family. Appetite adequate. Weight loss - 255 pounds - taking Ozempic . Sleeping better some nights than others - working night shift. Averages 8  hours. Focus and concentration improved. Managing aspects of household. 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  Denies AH or VH. Denies self harm. Denies substance use.  Previous medication trials: Lamictal , Cymbalta , Latuda, Hydroxyzine , Adderall, Vyvanse , Seroquel   Review of Systems:  Review of Systems  Musculoskeletal:  Negative for gait problem.  Neurological:  Negative for tremors.  Psychiatric/Behavioral:         Please refer to HPI    Medications: I have reviewed the patient's current medications.  Current Outpatient Medications  Medication Sig Dispense Refill   albuterol  (PROVENTIL ) (2.5 MG/3ML) 0.083% nebulizer solution Inhale into the lungs. (Patient not taking: Reported on 01/08/2022)     albuterol  (VENTOLIN  HFA) 108 (90 Base) MCG/ACT inhaler Inhale 1-2 puffs into the lungs every 6 (six) hours as needed for wheezing or shortness of breath. 18 g 0   amphetamine -dextroamphetamine  (ADDERALL) 30 MG tablet Take 1 tablet by mouth daily. 30 tablet 0   amphetamine -dextroamphetamine  (ADDERALL) 30 MG tablet Take 1 tablet by mouth daily. 30 tablet 0   amphetamine -dextroamphetamine  (ADDERALL) 30 MG tablet Take 1 tablet by mouth daily. 30 tablet 0   buPROPion  (WELLBUTRIN  XL) 150 MG 24 hr tablet Take 1 tablet (150 mg total) by mouth daily. 30 tablet 5   buPROPion  (WELLBUTRIN  XL) 300 MG 24 hr tablet Take one tablet every morning. 30 tablet 5   cariprazine  (VRAYLAR ) 1.5 MG capsule Take 1 capsule (1.5 mg total) by mouth daily. 30 capsule 5   cyclobenzaprine  (FLEXERIL ) 5 MG tablet Take 1 tablet (5 mg total) by mouth 3 (three) times daily as needed for muscle spasms. 30 tablet 0  LamoTRIgine  250 MG TB24 24 hour tablet Take 1 tablet (250 mg total) by mouth daily. 30 tablet 5   norgestimate -ethinyl estradiol  (ORTHO-CYCLEN) 0.25-35 MG-MCG tablet TAKE 1 TABLET BY MOUTH EVERY DAY (Patient not taking: Reported on 01/03/2023) 84 tablet 1   omeprazole  (PRILOSEC) 20 MG capsule Take 1 capsule (20 mg  total) by mouth daily. 30 capsule 0   ondansetron  (ZOFRAN -ODT) 4 MG disintegrating tablet Take 1 tablet (4 mg total) by mouth every 8 (eight) hours as needed for nausea or vomiting. 20 tablet 0   predniSONE  (DELTASONE ) 50 MG tablet Take 1 tablet (50 mg total) by mouth daily with breakfast. (Patient not taking: Reported on 01/03/2023) 5 tablet 0   prochlorperazine  (COMPAZINE ) 10 MG tablet Take 1 tablet (10 mg total) by mouth every 6 (six) hours as needed for nausea or vomiting. 15 tablet 0   promethazine -dextromethorphan (PROMETHAZINE -DM) 6.25-15 MG/5ML syrup Take 2.5 mLs by mouth 3 (three) times daily as needed for cough. (Patient not taking: Reported on 01/03/2023) 100 mL 0   QUEtiapine  (SEROQUEL ) 300 MG tablet Take 1 tablet (300 mg total) by mouth at bedtime. 30 tablet 5   Semaglutide , 1 MG/DOSE, (OZEMPIC , 1 MG/DOSE,) 4 MG/3ML SOPN Inject 1 mg into the skin once a week. (Patient not taking: Reported on 01/03/2023) 3 mL 5   No current facility-administered medications for this visit.    Medication Side Effects: None  Allergies:  Allergies  Allergen Reactions   Cephalexin  Rash    Past Medical History:  Diagnosis Date   ADHD (attention deficit hyperactivity disorder)    Anxiety    Bipolar 2 disorder (HCC)    Depression    Gestational diabetes    Hypertension    Migraine    Miscarriage     Family History  Problem Relation Age of Onset   Diabetes Mother    Bipolar disorder Mother    Anxiety disorder Mother    Depression Mother    OCD Mother    Diabetes Maternal Grandfather     Social History   Socioeconomic History   Marital status: Married    Spouse name: shannon   Number of children: 1   Years of education: Not on file   Highest education level: Associate degree: occupational, Scientist, product/process development, or vocational program  Occupational History   Not on file  Tobacco Use   Smoking status: Every Day    Current packs/day: 0.00    Types: Cigarettes    Last attempt to quit: 11/29/2012     Years since quitting: 11.4   Smokeless tobacco: Never  Vaping Use   Vaping status: Every Day  Substance and Sexual Activity   Alcohol use: Yes    Alcohol/week: 4.0 standard drinks of alcohol    Types: 2 Glasses of wine, 2 Standard drinks or equivalent per week    Comment: socially   Drug use: Never   Sexual activity: Yes    Partners: Male    Birth control/protection: Pill  Other Topics Concern   Not on file  Social History Narrative   Not on file   Social Drivers of Health   Financial Resource Strain: Low Risk  (05/04/2023)   Received from [facility]   Overall Financial Resource Strain (CARDIA)    Difficulty of Paying Living Expenses: Not hard at all  Food Insecurity: No Food Insecurity (05/04/2023)   Received from [facility]   Hunger Vital Sign    Worried About Running Out of Food in the  Last Year: Never true    Ran Out of Food in the Last Year: Never true  Transportation Needs: No Transportation Needs (05/04/2023)   Received from [facility] - Transportation    In the past 12 months, has lack of transportation kept you from medical appointments or from getting medications?: No    Lack of Transportation (Non-Medical): No  Physical Activity: Inactive (10/24/2019)   Exercise Vital Sign    Days of Exercise per Week: 0 days    Minutes of Exercise per Session: 0 min  Stress: No Stress Concern Present (10/24/2019)   [employer] of Occupational Health - Occupational Stress Questionnaire    Feeling of Stress : Not at all  Social Connections: Unknown (10/24/2019)   Social Connection and Isolation Panel [NHANES]    Frequency of Communication with Friends and Family: More than three times a week    Frequency of Social Gatherings with Friends and Family: More than three times a week    Attends Religious Services: Never    Database administrator or Organizations: No    Attends Banker Meetings: Never     Marital Status: Not on file  Intimate Partner Violence: Not At Risk (10/24/2019)   Humiliation, Afraid, Rape, and Kick questionnaire    Fear of Current or Ex-Partner: No    Emotionally Abused: No    Physically Abused: No    Sexually Abused: No    Past Medical History, Surgical history, Social history, and Family history were reviewed and updated as appropriate.   Please see review of systems for further details on the patient's review from today.   Objective:   Physical Exam:  There were no vitals taken for this visit.  Physical Exam Constitutional:      General: She is not in acute distress. Musculoskeletal:        General: No deformity.  Neurological:     Mental Status: She is alert and oriented to person, place, and time.     Coordination: Coordination normal.  Psychiatric:        Attention and Perception: Attention and perception normal. She does not perceive auditory or visual hallucinations.        Mood and Affect: Mood is anxious. Mood is not depressed. Affect is not labile, blunt, angry or inappropriate.        Speech: Speech normal.        Behavior: Behavior normal.        Thought Content: Thought content normal. Thought content is not paranoid or delusional. Thought content does not include homicidal or suicidal ideation. Thought content does not include homicidal or suicidal plan.        Cognition and Memory: Cognition and memory normal.        Judgment: Judgment normal.     Comments: Insight intact     Lab Review:     Component Value Date/Time   NA 137 12/31/2023 0700   NA 138 09/16/2013 2333   K 3.5 12/31/2023 0700   K 3.2 (L) 09/16/2013 2333   CL 106 12/31/2023 0700   CL 109 (H) 09/16/2013 2333   CO2 20 (L) 12/31/2023 0700   CO2 22 09/16/2013 2333   GLUCOSE 96 12/31/2023 0700   GLUCOSE 153 (H) 09/16/2013 2333   BUN 8 12/31/2023 0700   BUN 7 09/16/2013 2333   CREATININE 0.77 12/31/2023 0700   CREATININE 0.66 09/16/2013 2333   CALCIUM  9.6 12/31/2023  0700   CALCIUM   8.8 (L) 09/16/2013 2333   PROT 7.6 08/11/2023 1553   ALBUMIN 3.6 08/11/2023 1553   AST 22 08/11/2023 1553   ALT 19 08/11/2023 1553   ALKPHOS 100 08/11/2023 1553   BILITOT 0.5 08/11/2023 1553   GFRNONAA >60 12/31/2023 0700   GFRNONAA >60 09/16/2013 2333   GFRAA >60 05/02/2020 1914   GFRAA >60 09/16/2013 2333       Component Value Date/Time   WBC 13.0 (H) 12/31/2023 0700   RBC 4.83 12/31/2023 0700   HGB 13.7 12/31/2023 0700   HGB 11.1 08/27/2019 1048   HCT 40.0 12/31/2023 0700   HCT 34.0 08/27/2019 1048   PLT 332 12/31/2023 0700   PLT 276 08/27/2019 1048   MCV 82.8 12/31/2023 0700   MCV 86 08/27/2019 1048   MCV 84 09/16/2013 2333   MCH 28.4 12/31/2023 0700   MCHC 34.3 12/31/2023 0700   RDW 13.7 12/31/2023 0700   RDW 13.2 08/27/2019 1048   RDW 13.5 09/16/2013 2333   LYMPHSABS 2.8 07/05/2022 1712   LYMPHSABS 2.6 08/27/2019 1048   MONOABS 0.5 07/05/2022 1712   EOSABS 0.1 07/05/2022 1712   EOSABS 0.1 08/27/2019 1048   BASOSABS 0.1 07/05/2022 1712   BASOSABS 0.0 08/27/2019 1048    No results found for: "POCLITH", "LITHIUM"   No results found for: "PHENYTOIN", "PHENOBARB", "VALPROATE", "CBMZ"   .res Assessment: Plan:   Plan:  PDMP reviewed  Lamictal  250mg  daily Wellbutrin  XL 450mg  daily. Denies seizure history. Seroquel  300mg  at bedtime Adderall 30mg  every morning.  Vraylar  1.5mg  daily  Set up with a therapist  RTC 3 months  25 minutes spent dedicated to the care of this patient on the date of this encounter to include pre-visit review of records, ordering of medication, post visit documentation, and face-to-face time with the patient discussing ADD, GAD, BPD2 and insomnia. Discussed continuing current medication regimen.  Patient advised to contact office with any questions, adverse effects, or acute worsening in signs and symptoms.   Discussed potential benefits, risks, and side effects of stimulants with patient to include increased heart  rate, palpitations, insomnia, increased anxiety, increased irritability, or decreased appetite.  Instructed patient to contact office if experiencing any significant tolerability issues.   Counseled patient regarding potential benefits, risks, and side effects of Lamictal  to include potential risk of Stevens-Johnson syndrome. Advised patient to stop taking Lamictal  and contact office immediately if rash develops and to seek urgent medical attention if rash is severe and/or spreading quickly.   There are no diagnoses linked to this encounter.   Please see After Visit Summary for patient specific instructions.  No future appointments.  No orders of the defined types were placed in this encounter.     -------------------------------

## 2024-05-23 ENCOUNTER — Other Ambulatory Visit: Payer: Self-pay

## 2024-05-23 ENCOUNTER — Telehealth: Payer: Self-pay | Admitting: Adult Health

## 2024-05-23 NOTE — Telephone Encounter (Deleted)
 Please call to schedule an earlier appt to discuss patient's current complaints.

## 2024-05-23 NOTE — Telephone Encounter (Signed)
 LVM to [facility]

## 2024-05-23 NOTE — Telephone Encounter (Signed)
 Patient seen 6/6:  Lamictal  250mg  daily Wellbutrin  XL 450mg  daily. Denies seizure history. Seroquel  300mg  at bedtime Adderall 30mg  every morning.  Vraylar  1.5mg  daily   Set up with a therapist  Patient reporting situational issues that are going to be long term that area causing increased dep and anx. Rates anxiety as 6-7/10,  depression 7-8/10. Sleep poor, gets to sleep but waking up every couple of hours. Works nights, 4 days on, 4 days off. Drinks energy drinks when she gets to work. Adderall dosing depends on when she works.   When asked about counseling she said she had tried it but it wasn't a good fit for her.

## 2024-05-23 NOTE — Telephone Encounter (Signed)
 Pt lvm 6/24 5:23 pm. Having issues with depression and anxiety. Possible increase in meds would benefit? RTC [phone]. Last apt 6/6

## 2024-05-25 NOTE — Telephone Encounter (Signed)
 Let patient know that Tillman wanted an appt to discuss her concerns. Had sent msg previously for admin to call, but this has not been done yet.

## 2024-05-28 NOTE — Telephone Encounter (Addendum)
 Called LVM to RTC. Need sooner apt per Tillman.

## 2024-06-03 ENCOUNTER — Emergency Department

## 2024-06-03 ENCOUNTER — Emergency Department
Admission: EM | Admit: 2024-06-03 | Discharge: 2024-06-03 | Disposition: A | Discharge status: Home / Self Care | Attending: Emergency Medicine | Admitting: Emergency Medicine

## 2024-06-03 ENCOUNTER — Other Ambulatory Visit: Payer: Self-pay

## 2024-06-03 ENCOUNTER — Encounter: Payer: Self-pay | Admitting: Emergency Medicine

## 2024-06-03 DIAGNOSIS — S8010XA Contusion of unspecified lower leg, initial encounter: Secondary | ICD-10-CM

## 2024-06-03 DIAGNOSIS — Y92410 Unspecified street and highway as the place of occurrence of the external cause: Secondary | ICD-10-CM | POA: Diagnosis not present

## 2024-06-03 DIAGNOSIS — S8012XA Contusion of left lower leg, initial encounter: Secondary | ICD-10-CM | POA: Diagnosis not present

## 2024-06-03 DIAGNOSIS — S20212A Contusion of left front wall of thorax, initial encounter: Secondary | ICD-10-CM | POA: Insufficient documentation

## 2024-06-03 DIAGNOSIS — S60042A Contusion of left ring finger without damage to nail, initial encounter: Secondary | ICD-10-CM | POA: Diagnosis not present

## 2024-06-03 DIAGNOSIS — S2020XA Contusion of thorax, unspecified, initial encounter: Secondary | ICD-10-CM

## 2024-06-03 DIAGNOSIS — S6992XA Unspecified injury of left wrist, hand and finger(s), initial encounter: Secondary | ICD-10-CM | POA: Diagnosis present

## 2024-06-03 MED ORDER — ACETAMINOPHEN 325 MG PO TABS
650.0000 mg | ORAL_TABLET | Freq: Once | ORAL | Status: AC
Start: 2024-06-03 — End: 2024-06-03
  Administered 2024-06-03: 650 mg via ORAL
  Filled 2024-06-03: qty 2

## 2024-06-03 MED ORDER — LIDOCAINE 5 % EX PTCH
1.0000 | MEDICATED_PATCH | CUTANEOUS | Status: DC
  Administered 2024-06-03: 1 via TRANSDERMAL
  Filled 2024-06-03: qty 1

## 2024-06-03 MED ORDER — KETOROLAC TROMETHAMINE 15 MG/ML IJ SOLN
15.0000 mg | Freq: Once | INTRAMUSCULAR | Status: AC
  Administered 2024-06-03: 15 mg via INTRAMUSCULAR
  Filled 2024-06-03: qty 1

## 2024-06-03 NOTE — Discharge Instructions (Signed)
 Your CT scans and x-rays were normal.  You may continue to take Tylenol /ibuprofen  per package instructions to help with your symptoms.  Please return for any new, worsening, or change in symptoms or other concerns.  It was pleasure caring for you today.
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
## 2024-06-07 ENCOUNTER — Telehealth: Admitting: Adult Health

## 2024-06-07 ENCOUNTER — Encounter: Payer: Self-pay | Admitting: Adult Health

## 2024-06-07 DIAGNOSIS — G4700 Insomnia, unspecified: Secondary | ICD-10-CM

## 2024-06-07 DIAGNOSIS — F909 Attention-deficit hyperactivity disorder, unspecified type: Secondary | ICD-10-CM

## 2024-06-07 DIAGNOSIS — F411 Generalized anxiety disorder: Secondary | ICD-10-CM | POA: Diagnosis not present

## 2024-06-07 DIAGNOSIS — F3181 Bipolar II disorder: Secondary | ICD-10-CM | POA: Diagnosis not present

## 2024-06-07 MED ORDER — BUSPIRONE HCL 5 MG PO TABS: 5.0000 mg | ORAL_TABLET | Freq: Three times a day (TID) | ORAL | 2 refills | Status: DC

## 2024-06-07 NOTE — Progress Notes (Signed)
 Frances Lamb [id number] Mar 29, 1996 28 y.o.  Virtual Visit via Video Note  I connected with pt @ on 06/07/24 at  5:30 PM EDT by a video enabled telemedicine application and verified that I am speaking with the correct person using two identifiers.   I discussed the limitations of evaluation and management by telemedicine and the availability of in person appointments. The patient expressed understanding and agreed to proceed.  I discussed the assessment and treatment plan with the patient. The patient was provided an opportunity to ask questions and all were answered. The patient agreed with the plan and demonstrated an understanding of the instructions.   The patient was advised to call back or seek an in-person evaluation if the symptoms worsen or if the condition fails to improve as anticipated.  I provided 25 minutes of non-face-to-face time during this encounter.  The patient was located at home.  The provider was located at [facility].   Angeline Frances Sayers, NP   Subjective:   Patient ID:  Frances Lamb is a 28 y.o. (DOB 06-06-1996) female.  Chief Complaint: No chief complaint on file.   HPI Frances Lamb presents for follow-up of GAD, ADHD, BPD2, and insomnia.  Describes mood today as ok. Pleasant. Denies tearfulness. Mood symptoms - reports anxiety, depression and irritability. Reports decreased interest and motivation. Denies panic attacks. Reports some worry and over thinking. Denies rumination. Denies obsessive thoughts and acts. Reports  mood is variable. Stating I feel like I could be better. Taking medications as prescribed.  Energy levels lower - I feel like I don't have any. Active, unable to exercise with ankle injury.  Enjoys some usual interests and activities. Married. Lives with husband and son. Spending time with family. Appetite adequate. Weight loss - 255 pounds - taking Ozempic . Sleeping better some nights than others - working night shift. Averages  8 hours. Focus and concentration improved. Managing aspects of household. 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  Denies AH or VH. Denies self harm. Denies substance use.  Previous medication trials: Lamictal , Cymbalta , Latuda, Hydroxyzine , Adderall, Vyvanse , Seroquel   Review of Systems:  Review of Systems  Musculoskeletal:  Negative for gait problem.  Neurological:  Negative for tremors.  Psychiatric/Behavioral:         Please refer to HPI    Medications: I have reviewed the patient's current medications.  Current Outpatient Medications  Medication Sig Dispense Refill   busPIRone  (BUSPAR ) 5 MG tablet Take 1 tablet (5 mg total) by mouth 3 (three) times daily. 90 tablet 2   albuterol  (PROVENTIL ) (2.5 MG/3ML) 0.083% nebulizer solution Inhale into the lungs. (Patient not taking: Reported on 01/08/2022)     albuterol  (VENTOLIN  HFA) 108 (90 Base) MCG/ACT inhaler Inhale 1-2 puffs into the lungs every 6 (six) hours as needed for wheezing or shortness of breath. 18 g 0   amphetamine -dextroamphetamine  (ADDERALL) 30 MG tablet Take 1 tablet by mouth daily. 30 tablet 0   amphetamine -dextroamphetamine  (ADDERALL) 30 MG tablet Take 1 tablet by mouth daily. 30 tablet 0   [START ON 06/29/2024] amphetamine -dextroamphetamine  (ADDERALL) 30 MG tablet Take 1 tablet by mouth daily. 30 tablet 0   buPROPion  (WELLBUTRIN  XL) 150 MG 24 hr tablet Take 1 tablet (150 mg total) by mouth daily. 30 tablet 5   buPROPion  (WELLBUTRIN  XL) 300 MG 24 hr tablet Take one tablet every morning. 30 tablet 5   cariprazine  (VRAYLAR ) 1.5 MG capsule Take 1 capsule (1.5 mg total) by mouth daily. 30  capsule 5   cyclobenzaprine  (FLEXERIL ) 5 MG tablet Take 1 tablet (5 mg total) by mouth 3 (three) times daily as needed for muscle spasms. 30 tablet 0   LamoTRIgine  250 MG TB24 24 hour tablet Take 1 tablet (250 mg total) by mouth daily. 30 tablet 5   norgestimate -ethinyl estradiol  (ORTHO-CYCLEN) 0.25-35 MG-MCG tablet TAKE 1 TABLET BY MOUTH  EVERY DAY (Patient not taking: Reported on 01/03/2023) 84 tablet 1   omeprazole  (PRILOSEC) 20 MG capsule Take 1 capsule (20 mg total) by mouth daily. 30 capsule 0   ondansetron  (ZOFRAN -ODT) 4 MG disintegrating tablet Take 1 tablet (4 mg total) by mouth every 8 (eight) hours as needed for nausea or vomiting. 20 tablet 0   predniSONE  (DELTASONE ) 50 MG tablet Take 1 tablet (50 mg total) by mouth daily with breakfast. (Patient not taking: Reported on 01/03/2023) 5 tablet 0   prochlorperazine  (COMPAZINE ) 10 MG tablet Take 1 tablet (10 mg total) by mouth every 6 (six) hours as needed for nausea or vomiting. 15 tablet 0   promethazine -dextromethorphan (PROMETHAZINE -DM) 6.25-15 MG/5ML syrup Take 2.5 mLs by mouth 3 (three) times daily as needed for cough. (Patient not taking: Reported on 01/03/2023) 100 mL 0   QUEtiapine  (SEROQUEL ) 300 MG tablet Take 1 tablet (300 mg total) by mouth at bedtime. 30 tablet 5   Semaglutide , 1 MG/DOSE, (OZEMPIC , 1 MG/DOSE,) 4 MG/3ML SOPN Inject 1 mg into the skin once a week. (Patient not taking: Reported on 01/03/2023) 3 mL 5   No current facility-administered medications for this visit.    Medication Side Effects: None  Allergies:  Allergies  Allergen Reactions   Cephalexin  Rash    Past Medical History:  Diagnosis Date   ADHD (attention deficit hyperactivity disorder)    Anxiety    Bipolar 2 disorder (HCC)    Depression    Gestational diabetes    Hypertension    Migraine    Miscarriage     Family History  Problem Relation Age of Onset   Diabetes Mother    Bipolar disorder Mother    Anxiety disorder Mother    Depression Mother    OCD Mother    Diabetes Maternal Grandfather     Social History   Socioeconomic History   Marital status: Married    Spouse name: shannon   Number of children: 1   Years of education: Not on file   Highest education level: Associate degree: occupational, Scientist, product/process development, or vocational program  Occupational History   Not on file   Tobacco Use   Smoking status: Every Day    Current packs/day: 0.00    Types: Cigarettes    Last attempt to quit: 11/29/2012    Years since quitting: 11.5   Smokeless tobacco: Never  Vaping Use   Vaping status: Every Day  Substance and Sexual Activity   Alcohol use: Yes    Alcohol/week: 4.0 standard drinks of alcohol    Types: 2 Glasses of wine, 2 Standard drinks or equivalent per week    Comment: socially   Drug use: Never   Sexual activity: Yes    Partners: Male    Birth control/protection: Pill  Other Topics Concern   Not on file  Social History Narrative   Not on file   Social Drivers of Health   Financial Resource Strain: Low Risk  (05/04/2023)   Received from [facility]   Overall Financial Resource Strain (CARDIA)    Difficulty of Paying Living Expenses: Not hard at all  Food Insecurity: No Food Insecurity (05/04/2023)   Received from [facility]   Hunger Vital Sign    Within the past 12 months, you worried that your food would run out before you got the money to buy more.: Never true    Within the past 12 months, the food you bought just didn't last and you didn't have money to get more.: Never true  Transportation Needs: No Transportation Needs (05/04/2023)   Received from [facility] - Transportation    In the past 12 months, has lack of transportation kept you from medical appointments or from getting medications?: No    Lack of Transportation (Non-Medical): No  Physical Activity: Inactive (10/24/2019)   Exercise Vital Sign    Days of Exercise per Week: 0 days    Minutes of Exercise per Session: 0 min  Stress: No Stress Concern Present (10/24/2019)   [employer] of Occupational [employer] - Occupational Stress Questionnaire    Feeling of Stress : Not at all  Social Connections: Unknown (10/24/2019)   Social Connection and Isolation Panel    Frequency of Communication with Friends and Family: More  than three times a week    Frequency of Social Gatherings with Friends and Family: More than three times a week    Attends Religious Services: Never    Database administrator or Organizations: No    Attends Banker Meetings: Never    Marital Status: Not on file  Intimate Partner Violence: Not At Risk (10/24/2019)   Humiliation, Afraid, Rape, and Kick questionnaire    Fear of Current or Ex-Partner: No    Emotionally Abused: No    Physically Abused: No    Sexually Abused: No    Past Medical History, Surgical history, Social history, and Family history were reviewed and updated as appropriate.   Please see review of systems for further details on the patient's review from today.   Objective:   Physical Exam:  LMP 05/14/2024 (Approximate)   Physical Exam Constitutional:      General: She is not in acute distress. Musculoskeletal:        General: No deformity.  Neurological:     Mental Status: She is alert and oriented to person, place, and time.     Coordination: Coordination normal.  Psychiatric:        Attention and Perception: Attention and perception normal. She does not perceive auditory or visual hallucinations.        Mood and Affect: Mood is anxious and depressed. Affect is not labile, blunt, angry or inappropriate.        Speech: Speech normal.        Behavior: Behavior normal.        Thought Content: Thought content normal. Thought content is not paranoid or delusional. Thought content does not include homicidal or suicidal ideation. Thought content does not include homicidal or suicidal plan.        Cognition and Memory: Cognition and memory normal.        Judgment: Judgment normal.     Comments: Insight intact     Lab Review:     Component Value Date/Time   NA 137 12/31/2023 0700   NA 138 09/16/2013 2333   K 3.5 12/31/2023 0700   K 3.2 (L) 09/16/2013 2333   CL 106 12/31/2023 0700   CL 109 (H) 09/16/2013 2333   CO2 20 (L) 12/31/2023 0700   CO2  22 09/16/2013 2333  GLUCOSE 96 12/31/2023 0700   GLUCOSE 153 (H) 09/16/2013 2333   BUN 8 12/31/2023 0700   BUN 7 09/16/2013 2333   CREATININE 0.77 12/31/2023 0700   CREATININE 0.66 09/16/2013 2333   CALCIUM  9.6 12/31/2023 0700   CALCIUM  8.8 (L) 09/16/2013 2333   PROT 7.6 08/11/2023 1553   ALBUMIN 3.6 08/11/2023 1553   AST 22 08/11/2023 1553   ALT 19 08/11/2023 1553   ALKPHOS 100 08/11/2023 1553   BILITOT 0.5 08/11/2023 1553   GFRNONAA >60 12/31/2023 0700   GFRNONAA >60 09/16/2013 2333   GFRAA >60 05/02/2020 1914   GFRAA >60 09/16/2013 2333       Component Value Date/Time   WBC 13.0 (H) 12/31/2023 0700   RBC 4.83 12/31/2023 0700   HGB 13.7 12/31/2023 0700   HGB 11.1 08/27/2019 1048   HCT 40.0 12/31/2023 0700   HCT 34.0 08/27/2019 1048   PLT 332 12/31/2023 0700   PLT 276 08/27/2019 1048   MCV 82.8 12/31/2023 0700   MCV 86 08/27/2019 1048   MCV 84 09/16/2013 2333   MCH 28.4 12/31/2023 0700   MCHC 34.3 12/31/2023 0700   RDW 13.7 12/31/2023 0700   RDW 13.2 08/27/2019 1048   RDW 13.5 09/16/2013 2333   LYMPHSABS 2.8 07/05/2022 1712   LYMPHSABS 2.6 08/27/2019 1048   MONOABS 0.5 07/05/2022 1712   EOSABS 0.1 07/05/2022 1712   EOSABS 0.1 08/27/2019 1048   BASOSABS 0.1 07/05/2022 1712   BASOSABS 0.0 08/27/2019 1048    No results found for: POCLITH, LITHIUM   No results found for: PHENYTOIN, PHENOBARB, VALPROATE, CBMZ   .res Assessment: Plan:    Plan:  PDMP reviewed  Add Buspar  5mg  TID for anxiety  Lamictal  250mg  daily Wellbutrin  XL 450mg  daily. Denies seizure history. Seroquel  300mg  at bedtime Adderall 30mg  every morning.  Vraylar  1.5mg  daily  Set up with a therapist  RTC 4 weeks  25 minutes spent dedicated to the care of this patient on the date of this encounter to include pre-visit review of records, ordering of medication, post visit documentation, and face-to-face time with the patient discussing ADD, GAD, BPD2 and insomnia. Discussed  continuing current medication regimen.  Patient advised to contact office with any questions, adverse effects, or acute worsening in signs and symptoms.   Discussed potential benefits, risks, and side effects of stimulants with patient to include increased heart rate, palpitations, insomnia, increased anxiety, increased irritability, or decreased appetite.  Instructed patient to contact office if experiencing any significant tolerability issues.   Counseled patient regarding potential benefits, risks, and side effects of Lamictal  to include potential risk of Stevens-Johnson syndrome. Advised patient to stop taking Lamictal  and contact office immediately if rash develops and to seek urgent medical attention if rash is severe and/or spreading quickly.   Diagnoses and all orders for this visit:  Bipolar II disorder (HCC)  Attention deficit hyperactivity disorder (ADHD), unspecified ADHD type  Insomnia, unspecified type  GAD (generalized anxiety disorder) -     busPIRone  (BUSPAR ) 5 MG tablet; Take 1 tablet (5 mg total) by mouth 3 (three) times daily.     Please see After Visit Summary for patient specific instructions.  Future Appointments  Date Time Provider Department Center  08/03/2024  2:00 PM Abdulaziz Toman Nattalie, NP [facility] None    No orders of the defined types were placed in this encounter.     -------------------------------

## 2024-06-08 ENCOUNTER — Other Ambulatory Visit: Payer: Self-pay | Admitting: Family Medicine

## 2024-06-08 DIAGNOSIS — N6321 Unspecified lump in the left breast, upper outer quadrant: Secondary | ICD-10-CM

## 2024-06-11 ENCOUNTER — Inpatient Hospital Stay: Admission: RE | Admit: 2024-06-11 | Source: Ambulatory Visit

## 2024-08-03 ENCOUNTER — Telehealth: Admitting: Adult Health

## 2024-08-03 ENCOUNTER — Encounter: Payer: Self-pay | Admitting: Adult Health

## 2024-08-03 DIAGNOSIS — F3181 Bipolar II disorder: Secondary | ICD-10-CM

## 2024-08-03 DIAGNOSIS — F909 Attention-deficit hyperactivity disorder, unspecified type: Secondary | ICD-10-CM

## 2024-08-03 DIAGNOSIS — G4700 Insomnia, unspecified: Secondary | ICD-10-CM

## 2024-08-03 DIAGNOSIS — F411 Generalized anxiety disorder: Secondary | ICD-10-CM | POA: Diagnosis not present

## 2024-08-03 MED ORDER — BUPROPION HCL ER (XL) 150 MG PO TB24: 150.0000 mg | ORAL_TABLET | Freq: Every day | ORAL | 2 refills | Status: AC

## 2024-08-03 MED ORDER — AMPHETAMINE-DEXTROAMPHETAMINE 30 MG PO TABS: 30.0000 mg | ORAL_TABLET | Freq: Every day | ORAL | 0 refills | Status: AC

## 2024-08-03 MED ORDER — CARIPRAZINE HCL 1.5 MG PO CAPS: 1.5000 mg | ORAL_CAPSULE | Freq: Every day | ORAL | 2 refills | Status: AC

## 2024-08-03 MED ORDER — BUSPIRONE HCL 5 MG PO TABS: 5.0000 mg | ORAL_TABLET | Freq: Three times a day (TID) | ORAL | 2 refills | Status: AC

## 2024-08-03 MED ORDER — BUPROPION HCL ER (XL) 300 MG PO TB24: ORAL_TABLET | ORAL | 2 refills | Status: AC

## 2024-08-03 MED ORDER — LAMOTRIGINE ER 250 MG PO TB24
1.0000 | ORAL_TABLET | Freq: Every day | ORAL | 2 refills | Status: AC
Start: 2024-08-03 — End: ?

## 2024-08-03 MED ORDER — QUETIAPINE FUMARATE 300 MG PO TABS: 300.0000 mg | ORAL_TABLET | Freq: Every day | ORAL | 2 refills | Status: DC

## 2024-08-03 NOTE — Progress Notes (Signed)
 Frances Lamb [id number] 14-Sep-1996 28 y.o.  Virtual Visit via Video Note  I connected with pt @ on 08/03/24 at  2:00 PM EDT by a video enabled telemedicine application and verified that I am speaking with the correct person using two identifiers.   I discussed the limitations of evaluation and management by telemedicine and the availability of in person appointments. The patient expressed understanding and agreed to proceed.  I discussed the assessment and treatment plan with the patient. The patient was provided an opportunity to ask questions and all were answered. The patient agreed with the plan and demonstrated an understanding of the instructions.   The patient was advised to call back or seek an in-person evaluation if the symptoms worsen or if the condition fails to improve as anticipated.  I provided 25 minutes of non-face-to-face time during this encounter.  The patient was located at home.  The provider was located at [facility].   Frances Frances Sayers, NP   Subjective:   Patient ID:  Frances Lamb is a 28 y.o. (DOB [birth year]) female.  Chief Complaint: No chief complaint on file.   HPI Frances Lamb presents for follow-up of GAD, ADHD, BPD2, and insomnia.  Describes mood today as ok. Pleasant. Denies tearfulness. Mood symptoms - denies anxiety, depression and irritability. Reports improved interest and motivation. Denies panic attacks. Reports some worry and over thinking. Denies rumination. Denies obsessive thoughts and acts. Reports  mood has improved. Stating I feel like I'm doing a lot better. Taking medications as prescribed.  Energy levels better than it was. Active, unable to exercise with ankle injury.  Enjoys some usual interests and activities. Married. Lives with husband and son. Spending time with family. Appetite adequate. Weight loss - 244 pounds - taking Ozempic . Sleeping better some nights than others - working night shift. Averages 8 hours. Focus  and concentration improved. Managing aspects of household. 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  Denies AH or VH. Denies self harm. Denies substance use.  Previous medication trials: Lamictal , Cymbalta , Latuda, Hydroxyzine , Adderall, Vyvanse , Seroquel    Review of Systems:  Review of Systems  Musculoskeletal:  Negative for gait problem.  Neurological:  Negative for tremors.  Psychiatric/Behavioral:         Please refer to HPI    Medications: I have reviewed the patient's current medications.  Current Outpatient Medications  Medication Sig Dispense Refill   albuterol  (PROVENTIL ) (2.5 MG/3ML) 0.083% nebulizer solution Inhale into the lungs. (Patient not taking: Reported on 01/08/2022)     albuterol  (VENTOLIN  HFA) 108 (90 Base) MCG/ACT inhaler Inhale 1-2 puffs into the lungs every 6 (six) hours as needed for wheezing or shortness of breath. 18 g 0   amphetamine -dextroamphetamine  (ADDERALL) 30 MG tablet Take 1 tablet by mouth daily. 30 tablet 0   amphetamine -dextroamphetamine  (ADDERALL) 30 MG tablet Take 1 tablet by mouth daily. 30 tablet 0   amphetamine -dextroamphetamine  (ADDERALL) 30 MG tablet Take 1 tablet by mouth daily. 30 tablet 0   buPROPion  (WELLBUTRIN  XL) 150 MG 24 hr tablet Take 1 tablet (150 mg total) by mouth daily. 30 tablet 5   buPROPion  (WELLBUTRIN  XL) 300 MG 24 hr tablet Take one tablet every morning. 30 tablet 5   busPIRone  (BUSPAR ) 5 MG tablet Take 1 tablet (5 mg total) by mouth 3 (three) times daily. 90 tablet 2   cariprazine  (VRAYLAR ) 1.5 MG capsule Take 1 capsule (1.5 mg total) by mouth daily. 30 capsule 5   cyclobenzaprine  (FLEXERIL )  5 MG tablet Take 1 tablet (5 mg total) by mouth 3 (three) times daily as needed for muscle spasms. 30 tablet 0   LamoTRIgine  250 MG TB24 24 hour tablet Take 1 tablet (250 mg total) by mouth daily. 30 tablet 5   norgestimate -ethinyl estradiol  (ORTHO-CYCLEN) 0.25-35 MG-MCG tablet TAKE 1 TABLET BY MOUTH EVERY DAY (Patient not taking:  Reported on 01/03/2023) 84 tablet 1   omeprazole  (PRILOSEC) 20 MG capsule Take 1 capsule (20 mg total) by mouth daily. 30 capsule 0   ondansetron  (ZOFRAN -ODT) 4 MG disintegrating tablet Take 1 tablet (4 mg total) by mouth every 8 (eight) hours as needed for nausea or vomiting. 20 tablet 0   predniSONE  (DELTASONE ) 50 MG tablet Take 1 tablet (50 mg total) by mouth daily with breakfast. (Patient not taking: Reported on 01/03/2023) 5 tablet 0   prochlorperazine  (COMPAZINE ) 10 MG tablet Take 1 tablet (10 mg total) by mouth every 6 (six) hours as needed for nausea or vomiting. 15 tablet 0   promethazine -dextromethorphan (PROMETHAZINE -DM) 6.25-15 MG/5ML syrup Take 2.5 mLs by mouth 3 (three) times daily as needed for cough. (Patient not taking: Reported on 01/03/2023) 100 mL 0   QUEtiapine  (SEROQUEL ) 300 MG tablet Take 1 tablet (300 mg total) by mouth at bedtime. 30 tablet 5   Semaglutide , 1 MG/DOSE, (OZEMPIC , 1 MG/DOSE,) 4 MG/3ML SOPN Inject 1 mg into the skin once a week. (Patient not taking: Reported on 01/03/2023) 3 mL 5   No current facility-administered medications for this visit.    Medication Side Effects: None  Allergies:  Allergies  Allergen Reactions   Cephalexin  Rash    Past Medical History:  Diagnosis Date   ADHD (attention deficit hyperactivity disorder)    Anxiety    Bipolar 2 disorder (HCC)    Depression    Gestational diabetes    Hypertension    Migraine    Miscarriage     Family History  Problem Relation Age of Onset   Diabetes Mother    Bipolar disorder Mother    Anxiety disorder Mother    Depression Mother    OCD Mother    Diabetes Maternal Grandfather     Social History   Socioeconomic History   Marital status: Married    Spouse name: shannon   Number of children: 1   Years of education: Not on file   Highest education level: Associate degree: occupational, Scientist, product/process development, or vocational program  Occupational History   Not on file  Tobacco Use   Smoking status:  Every Day    Current packs/day: 0.00    Types: Cigarettes    Last attempt to quit: 11/29/2012    Years since quitting: 11.6   Smokeless tobacco: Never  Vaping Use   Vaping status: Every Day  Substance and Sexual Activity   Alcohol use: Yes    Alcohol/week: 4.0 standard drinks of alcohol    Types: 2 Glasses of wine, 2 Standard drinks or equivalent per week    Comment: socially   Drug use: Never   Sexual activity: Yes    Partners: Male    Birth control/protection: Pill  Other Topics Concern   Not on file  Social History Narrative   Not on file   Social Drivers of Health   Financial Resource Strain: Low Risk  (05/04/2023)   [facility] from [facility]   Overall Financial Resource Strain (CARDIA)    Difficulty of Paying Living Expenses: Not hard at all  Food Insecurity: No Food Insecurity (  05/04/2023)   Received from [facility]   Hunger Vital Sign    Within the past 12 months, you worried that your food would run out before you got the money to buy more.: Never true    Within the past 12 months, the food you bought just didn't last and you didn't have money to get more.: Never true  Transportation Needs: No Transportation Needs (05/04/2023)   Received from [facility] - Transportation    In the past 12 months, has lack of transportation kept you from medical appointments or from getting medications?: No    Lack of Transportation (Non-Medical): No  Physical Activity: Inactive (10/24/2019)   Exercise Vital Sign    Days of Exercise per Week: 0 days    Minutes of Exercise per Session: 0 min  Stress: No Stress Concern Present (10/24/2019)   [employer] of Occupational [employer] - Occupational Stress Questionnaire    Feeling of Stress : Not at all  Social Connections: Unknown (10/24/2019)   Social Connection and Isolation Panel    Frequency of Communication with Friends and Family: More than three times a week     Frequency of Social Gatherings with Friends and Family: More than three times a week    Attends Religious Services: Never    Database administrator or Organizations: No    Attends Banker Meetings: Never    Marital Status: Not on file  Intimate Partner Violence: Not At Risk (10/24/2019)   Humiliation, Afraid, Rape, and Kick questionnaire    Fear of Current or Ex-Partner: No    Emotionally Abused: No    Physically Abused: No    Sexually Abused: No    Past Medical History, Surgical history, Social history, and Family history were reviewed and updated as appropriate.   Please see review of systems for further details on the patient's review from today.   Objective:   Physical Exam:  There were no vitals taken for this visit.  Physical Exam Constitutional:      General: She is not in acute distress. Musculoskeletal:        General: No deformity.  Neurological:     Mental Status: She is alert and oriented to person, place, and time.     Coordination: Coordination normal.  Psychiatric:        Attention and Perception: Attention and perception normal. She does not perceive auditory or visual hallucinations.        Mood and Affect: Mood normal. Mood is not anxious or depressed. Affect is not labile, blunt, angry or inappropriate.        Speech: Speech normal.        Behavior: Behavior normal.        Thought Content: Thought content normal. Thought content is not paranoid or delusional. Thought content does not include homicidal or suicidal ideation. Thought content does not include homicidal or suicidal plan.        Cognition and Memory: Cognition and memory normal.        Judgment: Judgment normal.     Comments: Insight intact     Lab Review:     Component Value Date/Time   NA 137 12/31/2023 0700   NA 138 09/16/2013 2333   K 3.5 12/31/2023 0700   K 3.2 (L) 09/16/2013 2333   CL 106 12/31/2023 0700   CL 109 (H) 09/16/2013 2333   CO2 20 (L) 12/31/2023 0700   CO2  22  09/16/2013 2333   GLUCOSE 96 12/31/2023 0700   GLUCOSE 153 (H) 09/16/2013 2333   BUN 8 12/31/2023 0700   BUN 7 09/16/2013 2333   CREATININE 0.77 12/31/2023 0700   CREATININE 0.66 09/16/2013 2333   CALCIUM  9.6 12/31/2023 0700   CALCIUM  8.8 (L) 09/16/2013 2333   PROT 7.6 08/11/2023 1553   ALBUMIN 3.6 08/11/2023 1553   AST 22 08/11/2023 1553   ALT 19 08/11/2023 1553   ALKPHOS 100 08/11/2023 1553   BILITOT 0.5 08/11/2023 1553   GFRNONAA >60 12/31/2023 0700   GFRNONAA >60 09/16/2013 2333   GFRAA >60 05/02/2020 1914   GFRAA >60 09/16/2013 2333       Component Value Date/Time   WBC 13.0 (H) 12/31/2023 0700   RBC 4.83 12/31/2023 0700   HGB 13.7 12/31/2023 0700   HGB 11.1 08/27/2019 1048   HCT 40.0 12/31/2023 0700   HCT 34.0 08/27/2019 1048   PLT 332 12/31/2023 0700   PLT 276 08/27/2019 1048   MCV 82.8 12/31/2023 0700   MCV 86 08/27/2019 1048   MCV 84 09/16/2013 2333   MCH 28.4 12/31/2023 0700   MCHC 34.3 12/31/2023 0700   RDW 13.7 12/31/2023 0700   RDW 13.2 08/27/2019 1048   RDW 13.5 09/16/2013 2333   LYMPHSABS 2.8 07/05/2022 1712   LYMPHSABS 2.6 08/27/2019 1048   MONOABS 0.5 07/05/2022 1712   EOSABS 0.1 07/05/2022 1712   EOSABS 0.1 08/27/2019 1048   BASOSABS 0.1 07/05/2022 1712   BASOSABS 0.0 08/27/2019 1048    No results found for: POCLITH, LITHIUM   No results found for: PHENYTOIN, PHENOBARB, VALPROATE, CBMZ   .res Assessment: Plan:    Plan:  PDMP reviewed  Adderall 30mg  every morning Buspar  5mg  TID for anxiety Wellbutrin  XL 450mg  daily. Denies seizure history. Lamictal  250mg  daily Seroquel  300mg  at bedtime Vraylar  1.5mg  daily  Set up with a therapist  RTC 3 months  25 minutes spent dedicated to the care of this patient on the date of this encounter to include pre-visit review of records, ordering of medication, post visit documentation, and face-to-face time with the patient discussing ADD, GAD, BPD2 and insomnia. Discussed continuing  current medication regimen.  Patient advised to contact office with any questions, adverse effects, or acute worsening in signs and symptoms.   Discussed potential benefits, risks, and side effects of stimulants with patient to include increased heart rate, palpitations, insomnia, increased anxiety, increased irritability, or decreased appetite.  Instructed patient to contact office if experiencing any significant tolerability issues.   Counseled patient regarding potential benefits, risks, and side effects of Lamictal  to include potential risk of Stevens-Johnson syndrome. Advised patient to stop taking Lamictal  and contact office immediately if rash develops and to seek urgent medical attention if rash is severe and/or spreading quickly.   There are no diagnoses linked to this encounter.   Please see After Visit Summary for patient specific instructions.  Future Appointments  Date Time Provider Department Center  08/03/2024  2:00 PM Basil Blakesley Nattalie, NP [facility] None    No orders of the defined types were placed in this encounter.     -------------------------------

## 2024-09-11 ENCOUNTER — Telehealth: Payer: Self-pay | Admitting: Adult Health

## 2024-09-11 NOTE — Telephone Encounter (Signed)
 Pt lvm that she found out yesterday that she is pregnant. She needs rto know what meds she can take and ones that she might to wean off of. Please call Lajuana at [phone]

## 2024-09-11 NOTE — Telephone Encounter (Addendum)
 Pt reporting she is pregnant. Has first OB appt 11/11. She is taking: Adderall 30mg  every morning Buspar  5mg  TID for anxiety Wellbutrin  XL 450mg  daily.  Lamictal  250mg  daily Seroquel  300mg  at bedtime Vraylar  1.5mg  daily  She wants to know which meds she needs to come off of. I told her Adderall, but didn't know what else.

## 2024-09-14 ENCOUNTER — Telehealth: Payer: Self-pay

## 2024-09-14 NOTE — Telephone Encounter (Signed)
 Pt called to report she was pregnant. She has first OB appt on 11/11. She wanted to know what medications she needed to stop/taper. Told her she should stop Adderall and discuss further with OB what they recommend.   Tillman spoke with patient on 10/14 and reported she was going to talk to [facility] about safety of current medication regimen.  Called 10/16 and LVM to RC to see if this had been done.  Called again today and LVM to RC.

## 2024-09-18 NOTE — Telephone Encounter (Signed)
 Please see message and advise tapers as needed.

## 2024-09-18 NOTE — Telephone Encounter (Signed)
 She spoke with her OBGYN.  Per MD-Not much research on the meds to take during pregnancy. Could take Wellbutrin . Adderall is ok but not long term. Could possibly switch to Strattera or Concerta.   Seroquel  should be okay. Can take Lamictal  after the first trimester. No data on Vraylar  so he's unsure. Rec stop Vraylar . Buspar  is okay.  Per Izza-Adaeze doesn't want to take anything besides Seroquel  for sleep. Just Seroquel . She wants to go off of the other meds.  [phone]

## 2024-09-19 NOTE — Telephone Encounter (Signed)
 Here is a summary of the relative risks in a newly pregnant woman for the listed medications based on currently available data:  Adderall (30mg  daily): Adderall (amphetamine ) use in pregnancy is associated with increased risks of premature delivery, low birth weight, and neonatal withdrawal symptoms. However, large studies have not shown a clear increased risk of major birth defects. It is generally advised to avoid Adderall during pregnancy unless benefits outweigh risks due to severe ADHD symptoms affecting mother and baby care.  Buspar  (Buspirone  5mg  TID): Classified as pregnancy category B, with no evidence of harm in animal studies and limited data in humans. The safety profile appears relatively favorable, but sufficient evidence in human pregnancy is lacking, so consultation with a healthcare provider is essential.  Wellbutrin  XL (450mg  daily): Wellbutrin  (bupropion ) has mixed data; some studies suggest no significant increase in birth defects, but the evidence is not conclusive. As an antidepressant, it carries some risk but is often continued if the benefit to the mother's mental health outweighs the potential risks.  Lamictal  (Lamotrigine  250mg  daily): Considered relatively safer among anticonvulsants during pregnancy, with a lower risk of major malformations compared to others. However, it still carries some risk and requires monitoring [common clinical knowledge].  Seroquel  (Quetiapine  300mg  at bedtime): Antipsychotic use in pregnancy has some risks, including potential metabolic effects and possible neonatal withdrawal symptoms, but quetiapine  is sometimes continued when benefits outweigh risks. Data are limited but generally considered moderate risk [common clinical knowledge].  Vraylar  (Cariprazine  1.5mg  daily): There is very limited human pregnancy data on cariprazine . As an atypical antipsychotic, it is likely to have similar concerns as other antipsychotics with possible risks to the  fetus and neonate; it is usually avoided unless necessary [common clinical knowledge].  In summary, the stimulant Adderall presents notable risks during pregnancy and is generally avoided unless the mother's condition severely warrants it. Buspar  appears relatively safer but lacks extensive human pregnancy data. Wellbutrin , Lamictal , and Seroquel  carry varying degrees of risk and are often continued if benefits outweigh risks. Vraylar 's safety profile in pregnancy is unclear and likely cautious use is advised. Any medication adjustments should be personalized in consultation with the woman's healthcare providers, balancing maternal mental health and safety for the fetus.  This assessment integrates data from recent expert and clinical sources on medication safety in pregnancy

## 2024-09-20 ENCOUNTER — Other Ambulatory Visit: Payer: Self-pay

## 2024-09-20 ENCOUNTER — Encounter: Payer: Self-pay | Admitting: Emergency Medicine

## 2024-09-20 ENCOUNTER — Emergency Department
Admission: EM | Admit: 2024-09-20 | Discharge: 2024-09-20 | Disposition: A | Discharge status: Home / Self Care | Attending: Emergency Medicine | Admitting: Emergency Medicine

## 2024-09-20 ENCOUNTER — Emergency Department

## 2024-09-20 DIAGNOSIS — O26891 Other specified pregnancy related conditions, first trimester: Secondary | ICD-10-CM | POA: Insufficient documentation

## 2024-09-20 DIAGNOSIS — R1030 Lower abdominal pain, unspecified: Secondary | ICD-10-CM | POA: Diagnosis not present

## 2024-09-20 DIAGNOSIS — D72829 Elevated white blood cell count, unspecified: Secondary | ICD-10-CM | POA: Insufficient documentation

## 2024-09-20 DIAGNOSIS — I10 Essential (primary) hypertension: Secondary | ICD-10-CM | POA: Diagnosis not present

## 2024-09-20 DIAGNOSIS — Z3A01 Less than 8 weeks gestation of pregnancy: Secondary | ICD-10-CM | POA: Insufficient documentation

## 2024-09-20 LAB — POC URINE PREG, ED: Preg Test, Ur: POSITIVE — AB

## 2024-09-20 LAB — URINALYSIS, ROUTINE W REFLEX MICROSCOPIC
Bilirubin Urine: NEGATIVE
Glucose, UA: NEGATIVE mg/dL
Hgb urine dipstick: NEGATIVE
Ketones, ur: NEGATIVE mg/dL
Leukocytes,Ua: NEGATIVE
Nitrite: NEGATIVE
Protein, ur: NEGATIVE mg/dL
Specific Gravity, Urine: 1.024 (ref 1.005–1.030)
pH: 5 (ref 5.0–8.0)

## 2024-09-20 LAB — HCG, QUANTITATIVE, PREGNANCY: hCG, Beta Chain, Quant, S: 30375 m[IU]/mL — ABNORMAL HIGH (ref <5)

## 2024-09-20 LAB — CBC
HCT: 37.7 % (ref 36.0–46.0)
Hemoglobin: 12.4 g/dL (ref 12.0–15.0)
MCH: 28.2 pg (ref 26.0–34.0)
MCHC: 32.9 g/dL (ref 30.0–36.0)
MCV: 85.9 fL (ref 80.0–100.0)
Platelets: 320 K/uL (ref 150–400)
RBC: 4.39 MIL/uL (ref 3.87–5.11)
RDW: 13.8 % (ref 11.5–15.5)
WBC: 11.5 K/uL — ABNORMAL HIGH (ref 4.0–10.5)
nRBC: 0 % (ref 0.0–0.2)

## 2024-09-20 LAB — COMPREHENSIVE METABOLIC PANEL WITH GFR
ALT: 23 U/L (ref 0–44)
AST: 21 U/L (ref 15–41)
Albumin: 3.5 g/dL (ref 3.5–5.0)
Alkaline Phosphatase: 82 U/L (ref 38–126)
Anion gap: 6 (ref 5–15)
BUN: 11 mg/dL (ref 6–20)
CO2: 24 mmol/L (ref 22–32)
Calcium: 8.9 mg/dL (ref 8.9–10.3)
Chloride: 106 mmol/L (ref 98–111)
Creatinine, Ser: 0.64 mg/dL (ref 0.44–1.00)
GFR, Estimated: >60 mL/min (ref >60)
Glucose, Bld: 112 mg/dL — ABNORMAL HIGH (ref 70–99)
Potassium: 3.8 mmol/L (ref 3.5–5.1)
Sodium: 136 mmol/L (ref 135–145)
Total Bilirubin: 0.3 mg/dL (ref 0.0–1.2)
Total Protein: 7.5 g/dL (ref 6.5–8.1)

## 2024-09-20 LAB — LIPASE, BLOOD: Lipase: 39 U/L (ref 11–51)

## 2024-09-20 MED ORDER — ACETAMINOPHEN 325 MG PO TABS
650.0000 mg | ORAL_TABLET | Freq: Once | ORAL | Status: AC
  Administered 2024-09-20: 650 mg via ORAL
  Filled 2024-09-20: qty 2

## 2024-09-20 NOTE — ED Provider Notes (Signed)
 [facility] Provider Note    Event Date/Time   First MD Initiated Contact with Patient 09/20/24 [phone]     (approximate)   History   Abdominal Pain   HPI  Frances Lamb is a 28 y.o. female G60P1011 with PMH of miscarriage, hypertension, bipolar 2, ADHD, anxiety and depression presents for evaluation of abdominal pain that began around 6 PM last night.  Patient describes the pain as sharp.  Shooting across her lower abdomen.  She states that she is about [redacted] weeks pregnant but has not had confirmation by a doctor yet.  She says her menstrual cycles are irregular and states that her last 1 may have been on 9/1.  She denies associated symptoms including nausea, vomiting, diarrhea, constipation, dysuria, fever, vaginal bleeding, vaginal discharge.     Physical Exam   Triage Vital Signs: ED Triage Vitals  Encounter Vitals Group     BP 09/20/24 0711 134/87     Girls Systolic BP Percentile --      Girls Diastolic BP Percentile --      Boys Systolic BP Percentile --      Boys Diastolic BP Percentile --      Pulse Rate 09/20/24 0711 98     Resp 09/20/24 0711 18     Temp 09/20/24 0711 97.7 F (36.5 C)     Temp Source 09/20/24 0711 Oral     SpO2 09/20/24 0711 100 %     Weight 09/20/24 0708 240 lb (108.9 kg)     Height 09/20/24 0708 5' 2 (1.575 m)     Head Circumference --      Peak Flow --      Pain Score --      Pain Loc --      Pain Education --      Exclude from Growth Chart --     Most recent vital signs: Vitals:   09/20/24 0711  BP: 134/87  Pulse: 98  Resp: 18  Temp: 97.7 F (36.5 C)  SpO2: 100%   General: Awake, no distress.  CV:  Good peripheral perfusion.  RRR. Resp:  Normal effort.  CTAB. Abd:  No distention.  Soft, mild tenderness to palpation throughout the abdomen, more tender across the lower abdomen. Other:     ED Results / Procedures / Treatments   Labs (all labs ordered are listed, but only abnormal results are  displayed) Labs Reviewed  COMPREHENSIVE METABOLIC PANEL WITH GFR - Abnormal; Notable for the following components:      Result Value   Glucose, Bld 112 (*)    All other components within normal limits  CBC - Abnormal; Notable for the following components:   WBC 11.5 (*)    All other components within normal limits  URINALYSIS, ROUTINE W REFLEX MICROSCOPIC - Abnormal; Notable for the following components:   Color, Urine YELLOW (*)    APPearance HAZY (*)    All other components within normal limits  HCG, QUANTITATIVE, PREGNANCY - Abnormal; Notable for the following components:   hCG, Beta Chain, Quant, S 30,375 (*)    All other components within normal limits  POC URINE PREG, ED - Abnormal; Notable for the following components:   Preg Test, Ur POSITIVE (*)    All other components within normal limits  LIPASE, BLOOD   RADIOLOGY  Pelvic ultrasound obtained, interpreted the images as well as reviewed the radiologist report.  Please see ED course for interpretation.  PROCEDURES:  Critical Care  performed: No  Procedures   MEDICATIONS ORDERED IN ED: Medications  acetaminophen  (TYLENOL ) tablet 650 mg (650 mg Oral Given 09/20/24 0800)     IMPRESSION / MDM / ASSESSMENT AND PLAN / ED COURSE  I reviewed the triage vital signs and the nursing notes.                             28 year old female presents for evaluation of abdominal pain.  Vital signs are stable patient NAD on exam.  Differential diagnosis includes, but is not limited to, constipation, gastroenteritis, ectopic pregnancy, miscarriage, ovarian cyst, biliary disease, pancreatitis, diverticulitis, UTI, nephrolithiasis.  Patient's presentation is most consistent with acute complicated illness / injury requiring diagnostic workup.  I am most concerned about ectopic pregnancy given patient has not had a confirmed IUP yet.  We will obtain labs and plan for pelvic ultrasound.   CBC shows very mild leukocytosis but is  otherwise unremarkable.  CMP unremarkable.  Lipase within normal limits.  Urine pregnancy test is positive.  Urinalysis does not show any signs of infection.  Both ultrasound and labs are reassuring.  Patient's pain was improved with Tylenol .  Pain may have been due to gastroenteritis, constipation or muscle strain.  Labs not consistent with biliary disease, pancreatitis or diverticulitis.  No evidence of UTI and low suspicion for nephrolithiasis.  Do not feel that MRI of abdomen is indicated. Do not feel that further emergent workup is indicated.  Advised patient to follow-up with her OB/GYN and she has an appointment scheduled in November.  Discussed return precautions.  She voiced understanding, all questions were answered and she is stable at discharge.  Clinical Course as of 09/20/24 1040  Thu Sep 20, 2024  0921 hCG, quantitative, pregnancy(!) Consistent with gestational age between 5-8 weeks. Would expect to have findings of pregnancy on ultrasound. [LD]  0930 US  OB LESS THAN 14 WEEKS W/ OB TRANSVAGINAL AND DOPPLER ED provider interpretation: Single intrauterine pregnancy measuring 6 weeks and 1 day, HR 158. [LD]  1022 US  OB LESS THAN 14 WEEKS W/ OB TRANSVAGINAL AND DOPPLER IMPRESSION: Single, live intrauterine gestation with an estimated gestational age of [redacted] weeks, 1 day. Fetal heart rate of 158 beats per minute. Continued routine obstetric and sonographic follow-up is recommended. [LD]    Clinical Course User Index [LD] Cleaster Tinnie LABOR, PA-C     FINAL CLINICAL IMPRESSION(S) / ED DIAGNOSES   Final diagnoses:  Abdominal pain during pregnancy in first trimester     Rx / DC Orders   ED Discharge Orders     None        Note:  This document was prepared using Dragon voice recognition software and may include unintentional dictation errors.   Cleaster Tinnie LABOR, PA-C 09/20/24 1040    Dicky Anes, MD 09/20/24 [phone]

## 2024-09-20 NOTE — ED Triage Notes (Signed)
 Pt via POV from home. Pt c/o lower abd pain that started last night, report sharp/shooting pain. Report she is approx [redacted] weeks pregnant, but pregnancy has not been confirmed by a physician. Denies any NVD. Denies vaginal bleeding. LMP 9/1. Pt is A&Ox4 and NAD, ambulatory to triage with steady gait.

## 2024-09-20 NOTE — Discharge Instructions (Signed)
 Your blood work was reassuring.  Your urinalysis did not show signs of infection.  The ultrasound showed that you have a single live intrauterine pregnancy with a heartbeat of 158 bpm measuring 6 weeks and 1 day.  Please follow-up with your OB/GYN.  Return to the emergency department with any worsening symptoms.  You can take Tylenol , 650 mg every 6 hours as needed for pain.

## 2024-10-02 NOTE — Telephone Encounter (Signed)
 Sent via [employer]

## 2024-10-31 ENCOUNTER — Other Ambulatory Visit: Payer: Self-pay | Admitting: Adult Health

## 2024-10-31 ENCOUNTER — Encounter: Payer: Self-pay | Admitting: Adult Health

## 2024-10-31 ENCOUNTER — Telehealth: Payer: Self-pay | Admitting: Adult Health

## 2024-10-31 DIAGNOSIS — Z0389 Encounter for observation for other suspected diseases and conditions ruled out: Secondary | ICD-10-CM

## 2024-10-31 DIAGNOSIS — F3181 Bipolar II disorder: Secondary | ICD-10-CM

## 2024-10-31 DIAGNOSIS — G4700 Insomnia, unspecified: Secondary | ICD-10-CM

## 2024-10-31 NOTE — Progress Notes (Signed)
 Patient no show appointment. Called and LVM for patient to return call.

## 2024-10-31 NOTE — Telephone Encounter (Signed)
 Verify if she is still taking d/t pregnancy.

## 2024-11-02 NOTE — Telephone Encounter (Signed)
LVM to RC. ? Still taking.

## 2024-11-05 NOTE — Telephone Encounter (Signed)
 LVM per DPR, asking if she was still taking.

## 2024-11-08 ENCOUNTER — Encounter: Payer: Self-pay | Admitting: Adult Health

## 2024-11-08 ENCOUNTER — Telehealth: Admitting: Adult Health

## 2024-11-08 DIAGNOSIS — G4700 Insomnia, unspecified: Secondary | ICD-10-CM

## 2024-11-08 DIAGNOSIS — F909 Attention-deficit hyperactivity disorder, unspecified type: Secondary | ICD-10-CM | POA: Diagnosis not present

## 2024-11-08 DIAGNOSIS — F411 Generalized anxiety disorder: Secondary | ICD-10-CM

## 2024-11-08 DIAGNOSIS — F3181 Bipolar II disorder: Secondary | ICD-10-CM

## 2024-11-08 MED ORDER — QUETIAPINE FUMARATE 300 MG PO TABS: 300.0000 mg | ORAL_TABLET | Freq: Every day | ORAL | 1 refills | Status: AC

## 2024-11-08 NOTE — Progress Notes (Signed)
 [name] [id number] 1996-07-13 28 y.o.  Virtual Visit via Video Note  I connected with pt @ on 11/08/2024 at  1:30 PM EST by a video enabled telemedicine application and verified that I am speaking with the correct person using two identifiers.   I discussed the limitations of evaluation and management by telemedicine and the availability of in person appointments. The patient expressed understanding and agreed to proceed.  I discussed the assessment and treatment plan with the patient. The patient was provided an opportunity to ask questions and all were answered. The patient agreed with the plan and demonstrated an understanding of the instructions.   The patient was advised to call back or seek an in-person evaluation if the symptoms worsen or if the condition fails to improve as anticipated.  I provided 25 minutes of non-face-to-face time during this encounter.  The patient was located at home.  The provider was located at [facility].   Angeline LOISE Sayers, NP   Subjective:   Patient ID:  Frances Lamb is a 29 y.o. (DOB 1996-07-14) female.  Chief Complaint: No chief complaint on file.   HPI TRACYE SZUCH presents for follow-up of GAD, ADHD, BPD2, and insomnia.  Describes mood today as ok. Pleasant. Denies tearfulness. Mood symptoms - denies anxiety and depression Reports some irritability. Reports stable interest and motivation. Denies panic attacks. Denies  worry and over thinking. Denies rumination. Denies obsessive thoughts and acts. Reports mood is stable. Stating I feel like I'm doing good. Taking medications as prescribed.  Energy levels lower, but starting to improve. Active, unable to exercise with ankle injury.  Enjoys some usual interests and activities. Married. Lives with husband and son. Spending time with family. Appetite increased. Weight gain - 268 pounds. Sleeping better some nights than others - working night shift. Averages 8 hours. Focus and  concentration improved. Managing aspects of household. 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 Denies SI or HI.  Denies AH or VH. Denies self harm. Denies substance use.  Previous medication trials: Lamictal , Cymbalta , Latuda, Hydroxyzine , Adderall, Vyvanse , Seroquel    Review of Systems:  Review of Systems  Musculoskeletal:  Negative for gait problem.  Neurological:  Negative for tremors.  Psychiatric/Behavioral:         Please refer to HPI    Medications: I have reviewed the patient's current medications.  Current Outpatient Medications  Medication Sig Dispense Refill   albuterol  (VENTOLIN  HFA) 108 (90 Base) MCG/ACT inhaler Inhale 1-2 puffs into the lungs every 6 (six) hours as needed for wheezing or shortness of breath. 18 g 0   amphetamine -dextroamphetamine  (ADDERALL) 30 MG tablet Take 1 tablet by mouth daily. 30 tablet 0   amphetamine -dextroamphetamine  (ADDERALL) 30 MG tablet Take 1 tablet by mouth daily. 30 tablet 0   amphetamine -dextroamphetamine  (ADDERALL) 30 MG tablet Take 1 tablet by mouth daily. 30 tablet 0   buPROPion  (WELLBUTRIN  XL) 150 MG 24 hr tablet Take 1 tablet (150 mg total) by mouth daily. 30 tablet 2   buPROPion  (WELLBUTRIN  XL) 300 MG 24 hr tablet Take one tablet every morning. 30 tablet 2   busPIRone  (BUSPAR ) 5 MG tablet Take 1 tablet (5 mg total) by mouth 3 (three) times daily. 90 tablet 2   cariprazine  (VRAYLAR ) 1.5 MG capsule Take 1 capsule (1.5 mg total) by mouth daily. 30 capsule 2   cyclobenzaprine  (FLEXERIL ) 5 MG tablet Take 1 tablet (5 mg total) by mouth 3 (three) times daily as needed for muscle spasms. 30  tablet 0   LamoTRIgine  250 MG TB24 24 hour tablet Take 1 tablet (250 mg total) by mouth daily. 30 tablet 2   omeprazole  (PRILOSEC) 20 MG capsule Take 1 capsule (20 mg total) by mouth daily. 30 capsule 0   ondansetron  (ZOFRAN -ODT) 4 MG disintegrating tablet Take 1 tablet (4 mg total) by mouth every 8 (eight) hours as needed for nausea or vomiting. 20 tablet  0   prochlorperazine  (COMPAZINE ) 10 MG tablet Take 1 tablet (10 mg total) by mouth every 6 (six) hours as needed for nausea or vomiting. 15 tablet 0   QUEtiapine  (SEROQUEL ) 300 MG tablet Take 1 tablet (300 mg total) by mouth at bedtime. 30 tablet 2   No current facility-administered medications for this visit.    Medication Side Effects: None  Allergies: Allergies[1]  Past Medical History:  Diagnosis Date   ADHD (attention deficit hyperactivity disorder)    Anxiety    Bipolar 2 disorder (HCC)    Depression    Gestational diabetes    Hypertension    Migraine    Miscarriage     Family History  Problem Relation Age of Onset   Diabetes Mother    Bipolar disorder Mother    Anxiety disorder Mother    Depression Mother    OCD Mother    Diabetes Maternal Grandfather     Social History   Socioeconomic History   Marital status: Married    Spouse name: shannon   Number of children: 1   Years of education: Not on file   Highest education level: Associate degree: occupational, scientist, product/process development, or vocational program  Occupational History   Not on file  Tobacco Use   Smoking status: Every Day    Current packs/day: 0.00    Types: Cigarettes    Last attempt to quit: 11/29/2012    Years since quitting: 11.9   Smokeless tobacco: Never  Vaping Use   Vaping status: Every Day  Substance and Sexual Activity   Alcohol use: Yes    Alcohol/week: 4.0 standard drinks of alcohol    Types: 2 Glasses of wine, 2 Standard drinks or equivalent per week    Comment: socially   Drug use: Never   Sexual activity: Yes    Partners: Male    Birth control/protection: Pill  Other Topics Concern   Not on file  Social History Narrative   Not on file   Social Drivers of Health   Tobacco Use: High Risk (10/31/2024)   Patient History    Smoking Tobacco Use: Every Day    Smokeless Tobacco Use: Never    Passive Exposure: Not on file  Financial Resource Strain: Low Risk  (05/04/2023)   Received from [facility]   Overall Financial Resource Strain (CARDIA)    Difficulty of Paying Living Expenses: Not hard at all  Food Insecurity: No Food Insecurity (05/04/2023)   Received from [facility]   Epic    Within the past 12 months, you worried that your food would run out before you got the money to buy more.: Never true    Within the past 12 months, the food you bought just didn't last and you didn't have money to get more.: Never true  Transportation Needs: No Transportation Needs (05/04/2023)   Received from [facility] - Transportation    In the past 12 months, has lack of transportation kept you from medical appointments or from getting medications?: No  Lack of Transportation (Non-Medical): No  Physical Activity: Not on file  Stress: Not on file  Social Connections: Not on file  Intimate Partner Violence: Not on file  Depression (EYV7-0): Not on file  Alcohol Screen: Not on file  Housing: Unknown (06/08/2024)   Received from [facility]   Epic    In the last 12 months, was there a time when you were not able to pay the mortgage or rent on time?: No    Number of Times Moved in the Last Year: Not on file    At any time in the past 12 months, were you homeless or living in a shelter (including now)?: No  Utilities: Not At Risk (05/04/2023)   Received from [facility] Utilities    Threatened with loss of utilities: No  Health Literacy: Not on file    Past Medical History, Surgical history, Social history, and Family history were reviewed and updated as appropriate.   Please see review of systems for further details on the patient's review from today.   Objective:   Physical Exam:  LMP 07/30/2024 (Exact Date)   Physical Exam Constitutional:      General: She is not in acute distress. Musculoskeletal:        General: No deformity.  Neurological:     Mental Status: She is alert  and oriented to person, place, and time.     Coordination: Coordination normal.  Psychiatric:        Attention and Perception: Attention and perception normal. She does not perceive auditory or visual hallucinations.        Mood and Affect: Mood normal. Mood is not anxious or depressed. Affect is not labile, blunt, angry or inappropriate.        Speech: Speech normal.        Behavior: Behavior normal.        Thought Content: Thought content normal. Thought content is not paranoid or delusional. Thought content does not include homicidal or suicidal ideation. Thought content does not include homicidal or suicidal plan.        Cognition and Memory: Cognition and memory normal.        Judgment: Judgment normal.     Comments: Insight intact     Lab Review:     Component Value Date/Time   NA 136 09/20/2024 0710   NA 138 09/16/2013 2333   K 3.8 09/20/2024 0710   K 3.2 (L) 09/16/2013 2333   CL 106 09/20/2024 0710   CL 109 (H) 09/16/2013 2333   CO2 24 09/20/2024 0710   CO2 22 09/16/2013 2333   GLUCOSE 112 (H) 09/20/2024 0710   GLUCOSE 153 (H) 09/16/2013 2333   BUN 11 09/20/2024 0710   BUN 7 09/16/2013 2333   CREATININE 0.64 09/20/2024 0710   CREATININE 0.66 09/16/2013 2333   CALCIUM  8.9 09/20/2024 0710   CALCIUM  8.8 (L) 09/16/2013 2333   PROT 7.5 09/20/2024 0710   ALBUMIN 3.5 09/20/2024 0710   AST 21 09/20/2024 0710   ALT 23 09/20/2024 0710   ALKPHOS 82 09/20/2024 0710   BILITOT 0.3 09/20/2024 0710   GFRNONAA >60 09/20/2024 0710   GFRNONAA >60 09/16/2013 2333   GFRAA >60 05/02/2020 1914   GFRAA >60 09/16/2013 2333       Component Value Date/Time   WBC 11.5 (H) 09/20/2024 0710   RBC 4.39 09/20/2024 0710   HGB 12.4 09/20/2024 0710   HGB 11.1 08/27/2019 1048  HCT 37.7 09/20/2024 0710   HCT 34.0 08/27/2019 1048   PLT 320 09/20/2024 0710   PLT 276 08/27/2019 1048   MCV 85.9 09/20/2024 0710   MCV 86 08/27/2019 1048   MCV 84 09/16/2013 2333   MCH 28.2 09/20/2024 0710    MCHC 32.9 09/20/2024 0710   RDW 13.8 09/20/2024 0710   RDW 13.2 08/27/2019 1048   RDW 13.5 09/16/2013 2333   LYMPHSABS 2.8 07/05/2022 1712   LYMPHSABS 2.6 08/27/2019 1048   MONOABS 0.5 07/05/2022 1712   EOSABS 0.1 07/05/2022 1712   EOSABS 0.1 08/27/2019 1048   BASOSABS 0.1 07/05/2022 1712   BASOSABS 0.0 08/27/2019 1048    No results found for: POCLITH, LITHIUM   No results found for: PHENYTOIN, PHENOBARB, VALPROATE, CBMZ   .res Assessment: Plan:    Plan:  PDMP reviewed  Hold until after pregnancy: Vraylar  1.5mg  daily Adderall 30mg  every morning Buspar  5mg  TID for anxiety Wellbutrin  XL 450mg  daily. Denies seizure history. Lamictal  250mg  daily  Continue: Seroquel  300mg  at bedtime  Set up with a therapist  RTC 3 months  25 minutes spent dedicated to the care of this patient on the date of this encounter to include pre-visit review of records, ordering of medication, post visit documentation, and face-to-face time with the patient discussing ADD, GAD, BPD2 and insomnia. Discussed continuing current medication regimen.  Patient advised to contact office with any questions, adverse effects, or acute worsening in signs and symptoms.   Discussed potential benefits, risks, and side effects of stimulants with patient to include increased heart rate, palpitations, insomnia, increased anxiety, increased irritability, or decreased appetite.  Instructed patient to contact office if experiencing any significant tolerability issues.   Counseled patient regarding potential benefits, risks, and side effects of Lamictal  to include potential risk of Stevens-Johnson syndrome. Advised patient to stop taking Lamictal  and contact office immediately if rash develops and to seek urgent medical attention if rash is severe and/or spreading quickly.   There are no diagnoses linked to this encounter.   Please see After Visit Summary for patient specific instructions.  Future  Appointments  Date Time Provider Department Center  11/08/2024  1:30 PM Lauriann Milillo Nattalie, NP [facility] None    No orders of the defined types were placed in this encounter.     -------------------------------     [1]  Allergies Allergen Reactions   Cephalexin  Rash
